# Patient Record
Sex: Female | Born: 1960
Health system: Southern US, Community
[De-identification: ages and names within clinical notes are randomized; demographics above are authoritative.]

## PROBLEM LIST (undated history)

## (undated) DIAGNOSIS — K219 Gastro-esophageal reflux disease without esophagitis: Secondary | ICD-10-CM

## (undated) DIAGNOSIS — S0300XA Dislocation of jaw, unspecified side, initial encounter: Secondary | ICD-10-CM

## (undated) DIAGNOSIS — I10 Essential (primary) hypertension: Secondary | ICD-10-CM

## (undated) DIAGNOSIS — M25569 Pain in unspecified knee: Secondary | ICD-10-CM

## (undated) DIAGNOSIS — M170 Bilateral primary osteoarthritis of knee: Secondary | ICD-10-CM

## (undated) DIAGNOSIS — E785 Hyperlipidemia, unspecified: Secondary | ICD-10-CM

## (undated) DIAGNOSIS — B029 Zoster without complications: Secondary | ICD-10-CM

## (undated) DIAGNOSIS — T753XXA Motion sickness, initial encounter: Secondary | ICD-10-CM

## (undated) HISTORY — PX: LAPAROSCOPIC OVARIAN: SHX5906

## (undated) HISTORY — DX: Hyperlipidemia, unspecified: E78.5

## (undated) HISTORY — PX: COLONOSCOPY: SHX174

## (undated) HISTORY — DX: Zoster without complications: B02.9

## (undated) HISTORY — DX: Gastro-esophageal reflux disease without esophagitis: K21.9

## (undated) HISTORY — DX: Essential (primary) hypertension: I10

---

## 2001-05-23 ENCOUNTER — Other Ambulatory Visit: Admission: RE | Admit: 2001-05-23 | Discharge: 2001-05-23 | Payer: Self-pay | Admitting: Family Medicine

## 2005-10-05 ENCOUNTER — Ambulatory Visit: Payer: Self-pay | Admitting: Family Medicine

## 2006-10-13 ENCOUNTER — Ambulatory Visit: Payer: Self-pay | Admitting: Family Medicine

## 2007-10-17 ENCOUNTER — Ambulatory Visit: Payer: Self-pay | Admitting: Family Medicine

## 2008-10-22 ENCOUNTER — Ambulatory Visit: Payer: Self-pay | Admitting: Family Medicine

## 2009-10-23 ENCOUNTER — Ambulatory Visit: Payer: Self-pay | Admitting: Family Medicine

## 2009-10-27 ENCOUNTER — Ambulatory Visit: Payer: Self-pay | Admitting: Family Medicine

## 2009-11-25 ENCOUNTER — Ambulatory Visit: Payer: Self-pay | Admitting: Family Medicine

## 2010-12-10 ENCOUNTER — Ambulatory Visit: Payer: Self-pay | Admitting: Family Medicine

## 2011-07-26 ENCOUNTER — Ambulatory Visit: Payer: Self-pay | Admitting: Internal Medicine

## 2011-12-13 ENCOUNTER — Ambulatory Visit: Payer: Self-pay | Admitting: Family Medicine

## 2013-01-10 ENCOUNTER — Ambulatory Visit: Payer: Self-pay | Admitting: Family Medicine

## 2013-09-14 LAB — LIPID PANEL
Cholesterol: 219 mg/dL — AB (ref 0–200)
HDL: 59 mg/dL (ref 35–70)
LDL Cholesterol: 131 mg/dL
Triglycerides: 149 mg/dL (ref 40–160)

## 2013-09-14 LAB — BASIC METABOLIC PANEL
BUN: 12 mg/dL (ref 4–21)
CREATININE: 0.6 mg/dL (ref 0.5–1.1)
Glucose: 105 mg/dL
POTASSIUM: 4.7 mmol/L (ref 3.4–5.3)
SODIUM: 143 mmol/L (ref 137–147)

## 2013-09-14 LAB — HEPATIC FUNCTION PANEL
ALT: 18 U/L (ref 7–35)
AST: 18 U/L (ref 13–35)

## 2013-09-14 LAB — CBC AND DIFFERENTIAL
HCT: 44 % (ref 36–46)
HEMOGLOBIN: 15.3 g/dL (ref 12.0–16.0)
PLATELETS: 253 10*3/uL (ref 150–399)
WBC: 6.1 10*3/mL

## 2014-01-18 ENCOUNTER — Ambulatory Visit: Payer: Self-pay | Admitting: Family Medicine

## 2014-01-18 LAB — HM MAMMOGRAPHY

## 2014-01-18 LAB — HM PAP SMEAR: HM PAP: NEGATIVE

## 2014-04-12 ENCOUNTER — Ambulatory Visit: Payer: Self-pay | Admitting: Gastroenterology

## 2014-04-12 LAB — HM COLONOSCOPY

## 2014-12-14 DIAGNOSIS — M25561 Pain in right knee: Secondary | ICD-10-CM

## 2014-12-14 DIAGNOSIS — G43109 Migraine with aura, not intractable, without status migrainosus: Secondary | ICD-10-CM

## 2014-12-14 DIAGNOSIS — B029 Zoster without complications: Secondary | ICD-10-CM

## 2014-12-14 DIAGNOSIS — M1711 Unilateral primary osteoarthritis, right knee: Secondary | ICD-10-CM | POA: Insufficient documentation

## 2014-12-14 DIAGNOSIS — E559 Vitamin D deficiency, unspecified: Secondary | ICD-10-CM

## 2014-12-14 DIAGNOSIS — N926 Irregular menstruation, unspecified: Secondary | ICD-10-CM

## 2014-12-14 HISTORY — DX: Zoster without complications: B02.9

## 2015-02-07 ENCOUNTER — Ambulatory Visit (INDEPENDENT_AMBULATORY_CARE_PROVIDER_SITE_OTHER): Payer: PRIVATE HEALTH INSURANCE | Admitting: Family Medicine

## 2015-02-07 ENCOUNTER — Encounter: Payer: Self-pay | Admitting: Family Medicine

## 2015-02-07 VITALS — BP 116/78 | HR 76 | Temp 98.1°F | Resp 16 | Ht 63.0 in | Wt 290.0 lb

## 2015-02-07 DIAGNOSIS — Z6841 Body Mass Index (BMI) 40.0 and over, adult: Secondary | ICD-10-CM | POA: Insufficient documentation

## 2015-02-07 DIAGNOSIS — G4700 Insomnia, unspecified: Secondary | ICD-10-CM | POA: Diagnosis not present

## 2015-02-07 DIAGNOSIS — R7309 Other abnormal glucose: Secondary | ICD-10-CM

## 2015-02-07 DIAGNOSIS — E559 Vitamin D deficiency, unspecified: Secondary | ICD-10-CM

## 2015-02-07 DIAGNOSIS — Z0000 Encounter for general adult medical examination without abnormal findings: Secondary | ICD-10-CM

## 2015-02-07 DIAGNOSIS — IMO0001 Reserved for inherently not codable concepts without codable children: Secondary | ICD-10-CM | POA: Insufficient documentation

## 2015-02-07 LAB — POCT URINALYSIS DIPSTICK
Bilirubin, UA: NEGATIVE
Blood, UA: NEGATIVE
Glucose, UA: NEGATIVE
Ketones, UA: NEGATIVE
Leukocytes, UA: NEGATIVE
Nitrite, UA: NEGATIVE
PH UA: 6
PROTEIN UA: NEGATIVE
SPEC GRAV UA: 1.01
Urobilinogen, UA: 0.2

## 2015-02-07 MED ORDER — TRAZODONE HCL 50 MG PO TABS: 25.0000 mg | ORAL_TABLET | Freq: Every evening | ORAL | Status: DC | PRN

## 2015-02-07 NOTE — Progress Notes (Signed)
Patient: Mandy Baldwin, Female    DOB: April 26, 1961, 54 y.o.   MRN: [id number] Visit Date: 02/07/2015  Today's Provider: Margarita Rana, MD   Chief Complaint  Patient presents with  . Annual Exam  . Hyperglycemia   Subjective:    Annual physical exam Mandy Baldwin is a 54 y.o. female who presents today for health maintenance and complete physical. She feels well. She reports exercising including walking at work. She reports she is sleeping poorly (Improved with Benadryl or Tylenol PM).  Last CPE-01/18/2014 Last Pap- 01/18/2014- WNL, HPV negative Last Colon- 04/12/2014- Polyp. Repeat 5 years. ----------------------------------------------------------------- Hyperglycemia Pt's last Met C checked 1 year ago showed pt's glucose was 105.  Review of Systems  HENT: Positive for dental problem.   Eyes: Positive for discharge.  Psychiatric/Behavioral: Positive for sleep disturbance.  All other systems reviewed and are negative.   Social History She  reports that she has never smoked. She has never used smokeless tobacco. She reports that she does not drink alcohol or use illicit drugs. Social History   Social History  . Marital Status: Married    Spouse Name: Krithika Tome  . Number of Children: 0  . Years of Education: 13   Occupational History  . Customer Service     [employer]   Social History Main Topics  . Smoking status: Never Smoker   . Smokeless tobacco: Never Used  . Alcohol Use: No  . Drug Use: No  . Sexual Activity: Not Asked   Other Topics Concern  . None   Social History Narrative  . None    Patient Active Problem List   Diagnosis Date Noted  . Avitaminosis D 02/07/2015  . Body mass index of 60 or higher 02/07/2015  . Gravida 0 02/07/2015  . Abnormal glucose 02/07/2015  . Morbid obesity 12/14/2014  . Irregular menses 12/14/2014  . Shingles 12/14/2014  . Vitamin D deficiency 12/14/2014  . Ocular migraine 12/14/2014  . Knee  pain, right 12/14/2014    Past Surgical History  Procedure Laterality Date  . Laparoscopic ovarian      Family History  Family Status  Relation Status Death Age  . Mother Deceased   . Father Deceased   . Sister Deceased    Her family history includes Breast cancer in her sister; COPD in her father; Dementia in her mother.    No Known Allergies  Previous Medications   CHOLECALCIFEROL (VITAMIN D) 1000 UNITS TABLET    Take 2,000 Units by mouth daily.   CINNAMON 500 MG CAPSULE    Take 500 mg by mouth daily.   ETODOLAC (LODINE) 400 MG TABLET    Take 400 mg by mouth 2 (two) times daily.   MULTIPLE VITAMIN (MULTIVITAMINS PO)    Take by mouth.   MULTIPLE VITAMINS-MINERALS (VISION-VITE PRESERVE PO)    Take by mouth.   OMEGA-3 FATTY ACIDS (FISH OIL) 1000 MG CAPS    Take 2 capsules by mouth daily.    Patient Care Team: Margarita Rana, MD as PCP - General (Family Medicine)     Objective:   Vitals: BP 116/78 mmHg  Pulse 76  Temp(Src) 98.1 F (36.7 C) (Oral)  Resp 16  Ht _0  (1.6 m)  Wt 290 lb (131.543 kg)  BMI 51.38 kg/m2   Physical Exam  Constitutional: She is oriented to person, place, and time. She appears well-developed and well-nourished.  HENT:  Head: Normocephalic and atraumatic.  Right Ear: Tympanic membrane,  external ear and ear canal normal.  Left Ear: Tympanic membrane, external ear and ear canal normal.  Nose: Nose normal.  Mouth/Throat: Uvula is midline, oropharynx is clear and moist and mucous membranes are normal.  Eyes: Conjunctivae, EOM and lids are normal. Pupils are equal, round, and reactive to light.  Neck: Trachea normal and normal range of motion. Neck supple. Carotid bruit is not present. No thyroid mass and no thyromegaly present.  Cardiovascular: Normal rate, regular rhythm and normal heart sounds.   Pulmonary/Chest: Effort normal and breath sounds normal.  Abdominal: Soft. Normal appearance and bowel sounds are normal. There is no  hepatosplenomegaly. There is no tenderness.  Genitourinary: No breast swelling, tenderness or discharge.  Musculoskeletal: Normal range of motion.  Lymphadenopathy:    She has no cervical adenopathy.    She has no axillary adenopathy.  Neurological: She is alert and oriented to person, place, and time. She has normal strength. No cranial nerve deficit.  Skin: Skin is warm, dry and intact.  Psychiatric: She has a normal mood and affect. Her speech is normal and behavior is normal. Judgment and thought content normal. Cognition and memory are normal.     Depression Screen PHQ 2/9 Scores 02/07/2015  PHQ - 2 Score 0      Assessment & Plan:     Routine Health Maintenance and Physical Exam  Exercise Activities and Dietary recommendations Goals    . Reduce portion size       Immunization History  Administered Date(s) Administered  . MMR 12/24/1998  . Td 03/04/2005    Health Maintenance  Topic Date Due  . Hepatitis C Screening  Oct 19, 1960  . HIV Screening  11/26/1977  . INFLUENZA VACCINE  01/06/2015  . TETANUS/TDAP  03/05/2015  . MAMMOGRAM  01/19/2016  . PAP SMEAR  01/18/2017  . COLONOSCOPY  04/12/2024      Discussed health benefits of physical activity, and encouraged her to engage in regular exercise appropriate for her age and condition. - POCT urinalysis dipstick - TSH - Comprehensive metabolic panel - CBC with Differential/Platelet   Results for orders placed or performed in visit on 02/07/15  POCT urinalysis dipstick  Result Value Ref Range   Color, UA yellow    Clarity, UA clear    Glucose, UA N    Bilirubin, UA N    Ketones, UA N    Spec Grav, UA 1.010    Blood, UA N    pH, UA 6.0    Protein, UA N    Urobilinogen, UA 0.2    Nitrite, UA N    Leukocytes, UA Negative Negative    2. Abnormal glucose Had one high fasting. Will check labs.  - Hemoglobin A1c  3. Morbid obesity Will check labs.  - Comprehensive metabolic panel - CBC with  Differential/Platelet - Lipid panel  4. Insomnia- Will try Trazodone 50 mg every evening. Recheck as needed. Patient to call with response.   Patient was seen and examined by Jerrell Belfast, MD, and note scribed by Renaldo Fiddler, CMA. I have reviewed the document for accuracy and completeness and I agree with above. Jerrell Belfast, MD   Margarita Rana, MD     --------------------------------------------------------------------

## 2015-02-11 ENCOUNTER — Other Ambulatory Visit: Payer: Self-pay | Admitting: Family Medicine

## 2015-02-11 DIAGNOSIS — Z1231 Encounter for screening mammogram for malignant neoplasm of breast: Secondary | ICD-10-CM

## 2015-02-12 ENCOUNTER — Telehealth: Payer: Self-pay | Admitting: Family Medicine

## 2015-02-12 ENCOUNTER — Ambulatory Visit: Payer: Self-pay

## 2015-02-12 NOTE — Telephone Encounter (Signed)
Pt needs to have her lab req reprinted.  She misplaced the one she recd. When she was in.  Call back is [phone]  Thanks Barth Kirks

## 2015-02-12 NOTE — Telephone Encounter (Signed)
Pt advised.   Thanks,   -Sharlyn Odonnel  

## 2015-02-12 NOTE — Telephone Encounter (Signed)
Pt called to see if her lab slip was ready. Pt request to be called when it is ready. Thanks TNP

## 2015-02-13 ENCOUNTER — Other Ambulatory Visit: Payer: Self-pay | Admitting: Family Medicine

## 2015-02-13 ENCOUNTER — Ambulatory Visit
Admission: RE | Admit: 2015-02-13 | Discharge: 2015-02-13 | Disposition: A | Discharge status: Home / Self Care | Payer: No Typology Code available for payment source | Source: Ambulatory Visit | Attending: Family Medicine | Admitting: Family Medicine

## 2015-02-13 DIAGNOSIS — Z1231 Encounter for screening mammogram for malignant neoplasm of breast: Secondary | ICD-10-CM | POA: Insufficient documentation

## 2015-02-13 DIAGNOSIS — N6489 Other specified disorders of breast: Secondary | ICD-10-CM

## 2015-02-14 ENCOUNTER — Telehealth: Payer: Self-pay

## 2015-02-14 ENCOUNTER — Telehealth: Payer: Self-pay | Admitting: Family Medicine

## 2015-02-14 LAB — CBC WITH DIFFERENTIAL/PLATELET
Basophils Absolute: 0 10*3/uL (ref 0.0–0.2)
Basos: 0 %
EOS (ABSOLUTE): 0.1 10*3/uL (ref 0.0–0.4)
Eos: 1 %
Hematocrit: 46 % (ref 34.0–46.6)
Hemoglobin: 15.7 g/dL (ref 11.1–15.9)
Immature Grans (Abs): 0 10*3/uL (ref 0.0–0.1)
Immature Granulocytes: 0 %
LYMPHS ABS: 2.4 10*3/uL (ref 0.7–3.1)
Lymphs: 35 %
MCH: 31.2 pg (ref 26.6–33.0)
MCHC: 34.1 g/dL (ref 31.5–35.7)
MCV: 92 fL (ref 79–97)
MONOS ABS: 0.6 10*3/uL (ref 0.1–0.9)
Monocytes: 9 %
Neutrophils Absolute: 3.7 10*3/uL (ref 1.4–7.0)
Neutrophils: 55 %
Platelets: 240 10*3/uL (ref 150–379)
RBC: 5.03 x10E6/uL (ref 3.77–5.28)
RDW: 13.4 % (ref 12.3–15.4)
WBC: 6.8 10*3/uL (ref 3.4–10.8)

## 2015-02-14 LAB — TSH: TSH: 0.781 u[IU]/mL (ref 0.450–4.500)

## 2015-02-14 LAB — COMPREHENSIVE METABOLIC PANEL
A/G RATIO: 1.3 (ref 1.1–2.5)
ALBUMIN: 4.3 g/dL (ref 3.5–5.5)
ALK PHOS: 85 IU/L (ref 39–117)
ALT: 15 IU/L (ref 0–32)
AST: 14 IU/L (ref 0–40)
BILIRUBIN TOTAL: 0.3 mg/dL (ref 0.0–1.2)
BUN/Creatinine Ratio: 32 — ABNORMAL HIGH (ref 9–23)
BUN: 18 mg/dL (ref 6–24)
CHLORIDE: 102 mmol/L (ref 97–108)
CO2: 25 mmol/L (ref 18–29)
Calcium: 10 mg/dL (ref 8.7–10.2)
Creatinine, Ser: 0.56 mg/dL — ABNORMAL LOW (ref 0.57–1.00)
GFR calc Af Amer: 124 mL/min/{1.73_m2} (ref >59)
GFR calc non Af Amer: 108 mL/min/{1.73_m2} (ref >59)
GLOBULIN, TOTAL: 3.2 g/dL (ref 1.5–4.5)
Glucose: 110 mg/dL — ABNORMAL HIGH (ref 65–99)
Potassium: 4.9 mmol/L (ref 3.5–5.2)
SODIUM: 144 mmol/L (ref 134–144)
Total Protein: 7.5 g/dL (ref 6.0–8.5)

## 2015-02-14 LAB — LIPID PANEL
Chol/HDL Ratio: 3.6 ratio units (ref 0.0–4.4)
Cholesterol, Total: 214 mg/dL — ABNORMAL HIGH (ref 100–199)
HDL: 60 mg/dL (ref >39)
LDL Calculated: 124 mg/dL — ABNORMAL HIGH (ref 0–99)
Triglycerides: 149 mg/dL (ref 0–149)
VLDL CHOLESTEROL CAL: 30 mg/dL (ref 5–40)

## 2015-02-14 LAB — HEMOGLOBIN A1C
ESTIMATED AVERAGE GLUCOSE: 108 mg/dL
Hgb A1c MFr Bld: 5.4 % (ref 4.8–5.6)

## 2015-02-14 NOTE — Telephone Encounter (Signed)
Pt stated that at her visit last Friday she had to urinate in a cup and she thought maybe a urinalysis was done but she didn't get the results. Pt would like a call back for the results. Thanks TNP

## 2015-02-14 NOTE — Telephone Encounter (Signed)
Advised pt of lab results. Pt verbally acknowledges understanding. Emily Drozdowski, CMA   

## 2015-02-14 NOTE — Telephone Encounter (Signed)
Pt advised of ua result. sd

## 2015-02-14 NOTE — Telephone Encounter (Signed)
-----   Message from Nancy Maloney, MD sent at 02/14/2015  3:43 PM EDT ----- Labs stable.  Please notify patient. Thanks. 

## 2015-02-14 NOTE — Telephone Encounter (Signed)
-----   Message from Lorie Phenix, MD sent at 02/14/2015  3:43 PM EDT ----- Labs stable.  Please notify patient. Thanks.

## 2015-02-18 ENCOUNTER — Ambulatory Visit
Admission: RE | Admit: 2015-02-18 | Discharge: 2015-02-18 | Disposition: A | Discharge status: Home / Self Care | Payer: PRIVATE HEALTH INSURANCE | Source: Ambulatory Visit | Attending: Family Medicine | Admitting: Family Medicine

## 2015-02-18 DIAGNOSIS — N6489 Other specified disorders of breast: Secondary | ICD-10-CM

## 2015-02-18 DIAGNOSIS — N63 Unspecified lump in breast: Secondary | ICD-10-CM | POA: Diagnosis not present

## 2015-03-13 ENCOUNTER — Encounter: Payer: Self-pay | Admitting: Family Medicine

## 2015-03-13 ENCOUNTER — Ambulatory Visit (INDEPENDENT_AMBULATORY_CARE_PROVIDER_SITE_OTHER): Payer: PRIVATE HEALTH INSURANCE | Admitting: Family Medicine

## 2015-03-13 VITALS — BP 162/98 | HR 100 | Temp 98.2°F | Resp 18 | Wt 293.4 lb

## 2015-03-13 DIAGNOSIS — R030 Elevated blood-pressure reading, without diagnosis of hypertension: Secondary | ICD-10-CM | POA: Diagnosis not present

## 2015-03-13 DIAGNOSIS — H6983 Other specified disorders of Eustachian tube, bilateral: Secondary | ICD-10-CM | POA: Diagnosis not present

## 2015-03-13 DIAGNOSIS — IMO0001 Reserved for inherently not codable concepts without codable children: Secondary | ICD-10-CM

## 2015-03-13 MED ORDER — FLUTICASONE PROPIONATE 50 MCG/ACT NA SUSP: 2.0000 | Freq: Every day | NASAL | Status: DC

## 2015-03-13 NOTE — Progress Notes (Signed)
Patient ID: Mandy Baldwin, female   DOB: 1960-08-19, 54 y.o.   MRN: [id number] Name: Mandy Baldwin   MRN: [id number]    DOB: 06-07-1961   Date:03/13/2015       Progress Note  Subjective  Chief Complaint  Chief Complaint  Patient presents with  . Ear Pain    bilateral X 1 week    Otalgia  There is pain in both ears. This is a new problem. The current episode started 1 to 4 weeks ago. The problem occurs constantly. The problem has been gradually worsening.   History reviewed. No pertinent past medical history. Patient Active Problem List   Diagnosis Date Noted  . Avitaminosis D 02/07/2015  . Body mass index of 60 or higher (HCC) 02/07/2015  . Gravida 0 02/07/2015  . Abnormal glucose 02/07/2015  . Morbid obesity (HCC) 12/14/2014  . Irregular menses 12/14/2014  . Shingles 12/14/2014  . Vitamin D deficiency 12/14/2014  . Ocular migraine 12/14/2014  . Knee pain, right 12/14/2014   Past Surgical History  Procedure Laterality Date  . Laparoscopic ovarian     Family History  Problem Relation Age of Onset  . Dementia Mother   . COPD Father   . Breast cancer Sister 34    Social History  Substance Use Topics  . Smoking status: Never Smoker   . Smokeless tobacco: Never Used  . Alcohol Use: No    Current outpatient prescriptions:  .  cholecalciferol (VITAMIN D) 1000 UNITS tablet, Take 2,000 Units by mouth daily., Disp: , Rfl:  .  Cinnamon 500 MG capsule, Take 500 mg by mouth daily., Disp: , Rfl:  .  etodolac (LODINE) 400 MG tablet, Take 400 mg by mouth 2 (two) times daily., Disp: , Rfl:  .  Multiple Vitamin (MULTIVITAMINS PO), Take by mouth., Disp: , Rfl:  .  Multiple Vitamins-Minerals (VISION-VITE PRESERVE PO), Take by mouth., Disp: , Rfl:  .  Omega-3 Fatty Acids (FISH OIL) 1000 MG CAPS, Take 2 capsules by mouth daily., Disp: , Rfl:  .  traZODone (DESYREL) 50 MG tablet, Take 0.5-1 tablets (25-50 mg total) by mouth at bedtime as needed for sleep. (Patient not  taking: Reported on 03/13/2015), Disp: 30 tablet, Rfl: 3  No Known Allergies  Review of Systems  Constitutional: Negative.   HENT: Positive for ear pain and tinnitus.   Eyes: Negative.   Respiratory: Negative.   Cardiovascular: Negative.   Gastrointestinal: Negative.   Genitourinary: Negative.   Musculoskeletal: Negative.   Skin: Negative.   Neurological: Negative.   Endo/Heme/Allergies: Negative.   Psychiatric/Behavioral: Negative.    Objective  Filed Vitals:   03/13/15 0814  BP: 162/98  Pulse: 100  Temp: 98.2 F (36.8 C)  TempSrc: Oral  Resp: 18  Weight: 293 lb 6.4 oz (133.085 kg)  SpO2: 96%   Physical Exam  Constitutional: She is oriented to person, place, and time and well-developed, well-nourished, and in no distress.  HENT:  Head: Normocephalic.  Mouth/Throat: Oropharynx is clear and moist.  Dull reflex of TM's with some retraction.  Eyes: Conjunctivae and EOM are normal.  Neck: Neck supple.  Cardiovascular: Normal rate and regular rhythm.   Pulmonary/Chest: Effort normal and breath sounds normal.  Neurological: She is alert and oriented to person, place, and time.  Skin: No rash noted.  Psychiatric: Affect and judgment normal.    Recent Results (from the past 2160 hour(s))  POCT urinalysis dipstick     Status: Normal   Collection Time: 02/07/15  3:33 PM  Result Value Ref Range   Color, UA yellow    Clarity, UA clear    Glucose, UA N    Bilirubin, UA N    Ketones, UA N    Spec Grav, UA 1.010    Blood, UA N    pH, UA 6.0    Protein, UA N    Urobilinogen, UA 0.2    Nitrite, UA N    Leukocytes, UA Negative Negative  TSH     Status: None   Collection Time: 02/13/15  8:41 AM  Result Value Ref Range   TSH 0.781 0.450 - 4.500 uIU/mL  Comprehensive metabolic panel     Status: Abnormal   Collection Time: 02/13/15  8:41 AM  Result Value Ref Range   Glucose 110 (H) 65 - 99 mg/dL   BUN 18 6 - 24 mg/dL   Creatinine, Ser 1.61 (L) 0.57 - 1.00 mg/dL   GFR  calc non Af Amer 108 >59 mL/min/1.73   GFR calc Af Amer 124 >59 mL/min/1.73   BUN/Creatinine Ratio 32 (H) 9 - 23   Sodium 144 134 - 144 mmol/L   Potassium 4.9 3.5 - 5.2 mmol/L   Chloride 102 97 - 108 mmol/L   CO2 25 18 - 29 mmol/L   Calcium 10.0 8.7 - 10.2 mg/dL   Total Protein 7.5 6.0 - 8.5 g/dL   Albumin 4.3 3.5 - 5.5 g/dL   Globulin, Total 3.2 1.5 - 4.5 g/dL   Albumin/Globulin Ratio 1.3 1.1 - 2.5   Bilirubin Total 0.3 0.0 - 1.2 mg/dL   Alkaline Phosphatase 85 39 - 117 IU/L   AST 14 0 - 40 IU/L   ALT 15 0 - 32 IU/L  CBC with Differential/Platelet     Status: None   Collection Time: 02/13/15  8:41 AM  Result Value Ref Range   WBC 6.8 3.4 - 10.8 x10E3/uL   RBC 5.03 3.77 - 5.28 x10E6/uL   Hemoglobin 15.7 11.1 - 15.9 g/dL   Hematocrit 09.6 04.5 - 46.6 %   MCV 92 79 - 97 fL   MCH 31.2 26.6 - 33.0 pg   MCHC 34.1 31.5 - 35.7 g/dL   RDW 40.9 81.1 - 91.4 %   Platelets 240 150 - 379 x10E3/uL   Neutrophils 55 %   Lymphs 35 %   Monocytes 9 %   Eos 1 %   Basos 0 %   Neutrophils Absolute 3.7 1.4 - 7.0 x10E3/uL   Lymphocytes Absolute 2.4 0.7 - 3.1 x10E3/uL   Monocytes Absolute 0.6 0.1 - 0.9 x10E3/uL   EOS (ABSOLUTE) 0.1 0.0 - 0.4 x10E3/uL   Basophils Absolute 0.0 0.0 - 0.2 x10E3/uL   Immature Granulocytes 0 %   Immature Grans (Abs) 0.0 0.0 - 0.1 x10E3/uL  Hemoglobin A1c     Status: None   Collection Time: 02/13/15  8:41 AM  Result Value Ref Range   Hgb A1c MFr Bld 5.4 4.8 - 5.6 %    Comment:          Pre-diabetes: 5.7 - 6.4          Diabetes: >6.4          Glycemic control for adults with diabetes: <7.0    Est. average glucose Bld gHb Est-mCnc 108 mg/dL  Lipid panel     Status: Abnormal   Collection Time: 02/13/15  8:41 AM  Result Value Ref Range   Cholesterol, Total 214 (H) 100 - 199 mg/dL   Triglycerides  149 0 - 149 mg/dL   HDL 60 >66 mg/dL    Comment: According to ATP-III Guidelines, HDL-C >59 mg/dL is considered a negative risk factor for CHD.    VLDL Cholesterol Cal  30 5 - 40 mg/dL   LDL Calculated 440 (H) 0 - 99 mg/dL   Chol/HDL Ratio 3.6 0.0 - 4.4 ratio units    Comment:                                   T. Chol/HDL Ratio                                             Men  Women                               1/2 Avg.Risk  3.4    3.3                                   Avg.Risk  5.0    4.4                                2X Avg.Risk  9.6    7.1                                3X Avg.Risk 23.4   11.0     Assessment & Plan  1. Eustachian tube dysfunction, bilateral Onset over the past week. Some slight cough but no fever, sore throat or rhinorrhea. No recent flights or travel to higher altitudes. Will treat with Fluticasone nasal spray and may add Zyrtec or Claritin prn. Recheck in 10-14 days if no better. - fluticasone (FLONASE) 50 MCG/ACT nasal spray; Place 2 sprays into both nostrils daily.  Dispense: 16 g; Refill: 6  2. Elevated blood pressure No past history of BP issues. Denies dyspnea, chest pains or palpitations. Should limit caffeine and sodium in diet. Don't use decongestants now. Recheck BP at home and follow up her in 2 weeks with Dr. Elease Hashimoto if still elevated.

## 2015-03-14 ENCOUNTER — Telehealth: Payer: Self-pay | Admitting: Family Medicine

## 2015-03-14 NOTE — Telephone Encounter (Signed)
Advised pt as MD's notes for medication prescription sent to pharmacy by MD yesterday and what medications she can take OTC.  Pt verbalized fully understanding.  Thanks,

## 2015-03-14 NOTE — Telephone Encounter (Signed)
Pt would like to speak with a nurse to verify what Maurine Minister called in for her yesterday and what he told her she could also try over the counter. Thanks TNP

## 2015-04-30 ENCOUNTER — Other Ambulatory Visit: Payer: Self-pay | Admitting: Family Medicine

## 2015-06-03 ENCOUNTER — Encounter: Payer: Self-pay | Admitting: Family Medicine

## 2015-06-03 ENCOUNTER — Ambulatory Visit (INDEPENDENT_AMBULATORY_CARE_PROVIDER_SITE_OTHER): Payer: PRIVATE HEALTH INSURANCE | Admitting: Family Medicine

## 2015-06-03 VITALS — BP 140/88 | HR 73 | Temp 98.6°F | Resp 16 | Wt 295.4 lb

## 2015-06-03 DIAGNOSIS — M26609 Unspecified temporomandibular joint disorder, unspecified side: Secondary | ICD-10-CM | POA: Diagnosis not present

## 2015-06-03 DIAGNOSIS — H6981 Other specified disorders of Eustachian tube, right ear: Secondary | ICD-10-CM | POA: Diagnosis not present

## 2015-06-03 MED ORDER — PREDNISONE 10 MG PO TABS: ORAL_TABLET | ORAL | Status: DC

## 2015-06-03 MED ORDER — CYCLOBENZAPRINE HCL 5 MG PO TABS: 5.0000 mg | ORAL_TABLET | Freq: Every day | ORAL | Status: DC

## 2015-06-03 NOTE — Progress Notes (Signed)
Patient: Mandy Baldwin Female    DOB: 1960-11-28   54 y.o.   MRN: [id number] Visit Date: 06/03/2015  Today's Provider: Lorie Phenix, MD   Chief Complaint  Patient presents with  . Otalgia   Subjective:    Otalgia  There is pain in the right ear. This is a recurrent problem. The current episode started in the past 7 days. The problem occurs constantly. The problem has been gradually worsening. There has been no fever. The pain is at a severity of 6/10 (yesterday patient states it was a 12.). The pain is moderate. Associated symptoms include coughing. Pertinent negatives include no ear discharge, headaches, hearing loss, neck pain or sore throat. She has tried ear drops and NSAIDs (and nasal steroiids.  Also, been using a mouth guard at night.   Musclle relaxers did help . ) for the symptoms. The treatment provided mild relief. There is no history of a chronic ear infection or hearing loss.   Was treated for UTD.  Did not really help much. Also saw her dentist.   No dental issues. Is wearing an OTC mouth guard.   Here with her husband today.       No Known Allergies Previous Medications   CHOLECALCIFEROL (VITAMIN D) 1000 UNITS TABLET    Take 2,000 Units by mouth daily.   CINNAMON 500 MG CAPSULE    Take 500 mg by mouth daily.   ETODOLAC (LODINE) 400 MG TABLET    TAKE ONE TABLET BY MOUTH TWICE DAILY AS NEEDED   MULTIPLE VITAMINS-MINERALS (VISION-VITE PRESERVE PO)    Take by mouth.   OMEGA-3 FATTY ACIDS (FISH OIL) 1000 MG CAPS    Take 2 capsules by mouth daily.   TRAZODONE (DESYREL) 50 MG TABLET    Take 0.5-1 tablets (25-50 mg total) by mouth at bedtime as needed for sleep.    Review of Systems  Constitutional: Negative for fever.  HENT: Positive for congestion, ear pain and sinus pressure (just on the right side). Negative for ear discharge, hearing loss, sneezing, sore throat and trouble swallowing.   Respiratory: Positive for cough. Negative for shortness of breath.     Cardiovascular: Negative for chest pain.  Musculoskeletal: Negative for neck pain.  Neurological: Negative for facial asymmetry and headaches.    Social History  Substance Use Topics  . Smoking status: Never Smoker   . Smokeless tobacco: Never Used  . Alcohol Use: No   Objective:   BP 140/88 mmHg  Pulse 73  Temp(Src) 98.6 F (37 C) (Oral)  Resp 16  Wt 295 lb 6.4 oz (133.993 kg)  Physical Exam  Constitutional: She is oriented to person, place, and time. She appears well-developed and well-nourished.  HENT:  Head: Normocephalic and atraumatic.  Right Ear: External ear normal.  Left Ear: External ear normal.  Mouth/Throat: Oropharynx is clear and moist.  Eyes: Conjunctivae and EOM are normal. Pupils are equal, round, and reactive to light.  Neck: Normal range of motion. Neck supple.  Cardiovascular: Normal rate and regular rhythm.   Pulmonary/Chest: Effort normal and breath sounds normal.  Musculoskeletal: She exhibits tenderness.  Right  TMJ to palpation.    Neurological: She is alert and oriented to person, place, and time.  Psychiatric: She has a normal mood and affect. Her behavior is normal. Judgment and thought content normal.      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 Continue  Mouth guard.  Will treat as below.   Patient instructed to call back if condition worsens or does not improve.   No Etodolac while on Prednisone.   - cyclobenzaprine (FLEXERIL) 5 MG tablet; Take 1 tablet (5 mg total) by mouth at bedtime.  Dispense: 30 tablet; Refill: 1 - predniSONE (DELTASONE) 10 MG tablet; 6 po for 2 days and then 5 po for 2 days and then 4 po for 2 days and 3 po for 2 days and then 2 po for 2 days and then 1 po for 2 days.  Dispense: 42 tablet; Refill: 0  2. Eustachian tube dysfunction, right Will treat with Prednisone. Follow up if does not resolve.    Lorie PhenixNancy Atticus Wedin, MD       Lorie PhenixNancy Raeanna Soberanes, MD  [facility]

## 2015-06-03 NOTE — Patient Instructions (Signed)

## 2015-06-18 ENCOUNTER — Telehealth: Payer: Self-pay | Admitting: Family Medicine

## 2015-06-18 MED ORDER — AMOXICILLIN-POT CLAVULANATE 875-125 MG PO TABS: 1.0000 | ORAL_TABLET | Freq: Two times a day (BID) | ORAL | Status: DC

## 2015-06-18 MED ORDER — ONDANSETRON 4 MG PO TBDP: 4.0000 mg | ORAL_TABLET | Freq: Three times a day (TID) | ORAL | Status: DC | PRN

## 2015-06-18 NOTE — Telephone Encounter (Signed)
Pt would also like something sent in for nausea.  [facility] [address]   Thanks,   -Vernona RiegerLaura

## 2015-06-18 NOTE — Telephone Encounter (Signed)
Sent in antibiotic. Thanks.  

## 2015-06-18 NOTE — Telephone Encounter (Signed)
Pt states she was in last week for TMJ and that is better.  Pt states she still has the cough and congestion. Pt is coughing up green phlegm.   Pt is requesting a Rx to help with this if possible. Pt states an antibiotic will make her have nausea so she will need a RX to help with that also.   [facility] [address]  YQ#[phone]/BMCB#[phone]/MW

## 2015-06-26 ENCOUNTER — Other Ambulatory Visit: Payer: Self-pay | Admitting: Family Medicine

## 2015-06-26 DIAGNOSIS — M26609 Unspecified temporomandibular joint disorder, unspecified side: Secondary | ICD-10-CM

## 2015-06-27 ENCOUNTER — Encounter: Payer: Self-pay | Admitting: Family Medicine

## 2015-06-27 ENCOUNTER — Ambulatory Visit (INDEPENDENT_AMBULATORY_CARE_PROVIDER_SITE_OTHER): Payer: PRIVATE HEALTH INSURANCE | Admitting: Family Medicine

## 2015-06-27 VITALS — BP 124/92 | HR 90 | Temp 99.1°F | Resp 16 | Wt 292.0 lb

## 2015-06-27 DIAGNOSIS — J209 Acute bronchitis, unspecified: Secondary | ICD-10-CM

## 2015-06-27 DIAGNOSIS — K648 Other hemorrhoids: Secondary | ICD-10-CM | POA: Insufficient documentation

## 2015-06-27 DIAGNOSIS — B373 Candidiasis of vulva and vagina: Secondary | ICD-10-CM

## 2015-06-27 DIAGNOSIS — B3731 Acute candidiasis of vulva and vagina: Secondary | ICD-10-CM | POA: Insufficient documentation

## 2015-06-27 MED ORDER — FLUCONAZOLE 150 MG PO TABS: 150.0000 mg | ORAL_TABLET | Freq: Once | ORAL | Status: DC

## 2015-06-27 MED ORDER — AZITHROMYCIN 250 MG PO TABS: ORAL_TABLET | ORAL | Status: DC

## 2015-06-27 NOTE — Progress Notes (Signed)
Subjective:     Patient ID: Mandy Baldwin, female   DOB: 11-29-1960, 55 y.o.   MRN: [id number]  Chief Complaint  Patient presents with  . Rectal Bleeding    Pt noticed blood in toilet bowl with red water and bloody toilet tissue this morning. Pt had a similar BM about 20 minutes later.     Rectal Bleeding  The current episode started today. Episode frequency: twice, 20 minutes apart this morning. None since.  Progression since onset: Two BMs that stained toilet bowel red and stained the entire tissue paper bright red. Had third, small BM later at work 3 hrs later with some red streaking on the toilet paper but not the amount she had in am. No pain. No other symptoms.   The patient is experiencing no pain. Stool description: 1st BM normal consistency, 2nd BM large volume softer but not diarrhea.  Treatments tried: Not had these symptoms previously.  Associated symptoms include hemorrhoids (internal, noted on colonoscopy. ), nausea (after getting out of the shower after BM, none since. Did take a hot shower.  ) and coughing. Pertinent negatives include no abdominal pain, no diarrhea, no hematemesis, no rectal pain, no hematuria, no vaginal bleeding and no vaginal discharge. She has been behaving normally. She has been eating and drinking normally. Her past medical history is significant for recent antibiotic use and a recent illness. Her past medical history does not include recent change in diet.   Last colonoscopy 2015 found a polyp and  internal hemorrhoids. No inflammation or colitis.   Cough Has been coughing for a few weeks. Producing green mucous 3-4x/day, unchanged. Took Augmentin 7 days (called in 06/18/15), did not complete the course secondary to vaginal irritation.. No fevers. No rhinorrhea, sore throat, congestion, sinus pain.  Pt has had pneumonia in the past during which she had fever to 102 and  productive sputum.  This does not feel like pneumonia to her.   Vaginal burning For  3-4 days with every urinary void burning in vaginal and rectal area. No urgency, frequency. No hematuria or changes in appearance of urine. Took Augmentin for 7 days, stopped 2 days ago.    Has not had treatment for a yeast infection.    Patient Active Problem List   Diagnosis Date Noted  . Bronchitis, acute 06/27/2015  . Internal hemorrhoid, bleeding 06/27/2015  . Vaginal candidiasis 06/27/2015  . 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 Avitaminosis D 02/07/2015  . Body mass index of 60 or higher (HCC) 02/07/2015  . Gravida 0 02/07/2015  . Abnormal glucose 02/07/2015  . Morbid obesity (HCC) 12/14/2014  . Irregular menses 12/14/2014  . Shingles 12/14/2014  . Vitamin D deficiency 12/14/2014  . Ocular migraine 12/14/2014  . Knee pain, right 12/14/2014   Previous Medications   CHOLECALCIFEROL (VITAMIN D) 1000 UNITS TABLET    Take 2,000 Units by mouth daily.   CINNAMON 500 MG CAPSULE    Take 500 mg by mouth daily.   CYCLOBENZAPRINE (FLEXERIL) 5 MG TABLET    Take 1 tablet (5 mg total) by mouth at bedtime.   ETODOLAC (LODINE) 400 MG TABLET    TAKE ONE TABLET BY MOUTH TWICE DAILY AS NEEDED   MULTIPLE VITAMINS-MINERALS (VISION-VITE PRESERVE PO)    Take by mouth.   OMEGA-3 FATTY ACIDS (FISH OIL) 1000 MG CAPS    Take 2 capsules by mouth daily.   No Known Allergies Past Surgical History  Procedure Laterality Date  . Laparoscopic ovarian  Family History  Problem Relation Age of Onset  . Dementia Mother   . COPD Father   . Breast cancer Sister 59  . Aortic aneurysm Mother    Social History   Social History  . Marital Status: Married    Spouse Name: Yukiko Minnich  . Number of Children: 0  . Years of Education: 13   Occupational History  . Customer Service     [employer]   Social History Main Topics  . Smoking status: Never Smoker   . Smokeless tobacco: Never Used  . Alcohol Use: No  . Drug Use: No  . Sexual Activity: Not on file   Other Topics Concern  . Not on  file   Social History Narrative     Review of Systems  HENT: Negative for congestion, rhinorrhea, sinus pressure and sore throat.   Eyes: Negative.   Respiratory: Positive for cough. Negative for shortness of breath.   Gastrointestinal: Positive for nausea (after getting out of the shower after BM, none since. Did take a hot shower.  ), blood in stool, hematochezia and hemorrhoids (internal, noted on colonoscopy. ). Negative for abdominal pain, diarrhea, rectal pain and hematemesis.  Genitourinary: Positive for dysuria. Negative for urgency, frequency, hematuria, vaginal bleeding and vaginal discharge.       Objective:   Physical Exam  Constitutional: She is oriented to person, place, and time. She appears well-developed and well-nourished. No distress.  HENT:  Head: Normocephalic and atraumatic.  Nose: Nose normal.  Mouth/Throat: Oropharynx is clear and moist. No oropharyngeal exudate.  Bilateral TM clear, no frontal or maxillary sinus tenderness  Eyes: EOM are normal. Pupils are equal, round, and reactive to light. Right eye exhibits no discharge. Left eye exhibits no discharge.  Neck: Normal range of motion. Neck supple.  Cardiovascular: Normal rate, regular rhythm and normal heart sounds.   Pulmonary/Chest: Effort normal and breath sounds normal. No respiratory distress.  Abdominal: She exhibits no distension.  Genitourinary:  Significant erythema and irritation of skin around the anorectal area, small nonbleeding external hemorrhoid, slight bleeding on internal hemorrhoid visualized on anoscopy today.  Slight blood tinge noted on anoscope, with light brown stool noted.    Neurological: She is alert and oriented to person, place, and time. No cranial nerve deficit.  Skin: Rash (around rectum.  ) noted. She is not diaphoretic. There is erythema.  Psychiatric: She has a normal mood and affect.    BP 124/92 mmHg  Pulse 90  Temp(Src) 99.1 F (37.3 C) (Oral)  Resp 16  Wt 292 lb  (132.45 kg)  SpO2 97%     Assessment:     Suspect bronchitis, internal hemorrhoid and candidiasis.   Plan:     1. Acute bronchitis, unspecified organism Not resolved with Augmentin. Will treat with Zpak.  Patient instructed to call back if condition worsens or does not improve.    - azithromycin (ZITHROMAX) 250 MG tablet; 2 today and then one a day for 4 more days.  Dispense: 6 tablet; Refill: 0  2. Internal hemorrhoid, bleeding Reassurance.  Patient instructed to call back if condition worsens or does not improve.  If any blood mixed in stools, melana, or dark tarry stools, call office or answering service as could be sign or worsening, different problem.    3. Vaginal candidiasis New problem. Will treat. Patient instructed to call back if condition worsens or does not improve.    - fluconazole (DIFLUCAN) 150 MG tablet; Take 1 tablet (150 mg total)  by mouth once.  Dispense: 1 tablet; Refill: 0   Lorie Phenix, MD

## 2015-07-02 ENCOUNTER — Telehealth: Payer: Self-pay | Admitting: Family Medicine

## 2015-07-13 ENCOUNTER — Emergency Department
Admission: EM | Admit: 2015-07-13 | Discharge: 2015-07-13 | Disposition: A | Discharge status: Home / Self Care | Payer: No Typology Code available for payment source | Attending: Emergency Medicine | Admitting: Emergency Medicine

## 2015-07-13 ENCOUNTER — Encounter: Payer: Self-pay | Admitting: Emergency Medicine

## 2015-07-13 ENCOUNTER — Emergency Department: Payer: No Typology Code available for payment source

## 2015-07-13 DIAGNOSIS — Z792 Long term (current) use of antibiotics: Secondary | ICD-10-CM | POA: Diagnosis not present

## 2015-07-13 DIAGNOSIS — Z79899 Other long term (current) drug therapy: Secondary | ICD-10-CM | POA: Diagnosis not present

## 2015-07-13 DIAGNOSIS — R079 Chest pain, unspecified: Secondary | ICD-10-CM | POA: Insufficient documentation

## 2015-07-13 HISTORY — DX: Dislocation of jaw, unspecified side, initial encounter: S03.00XA

## 2015-07-13 LAB — CBC
HEMATOCRIT: 46.4 % (ref 35.0–47.0)
HEMOGLOBIN: 15.6 g/dL (ref 12.0–16.0)
MCH: 30.6 pg (ref 26.0–34.0)
MCHC: 33.7 g/dL (ref 32.0–36.0)
MCV: 90.9 fL (ref 80.0–100.0)
Platelets: 247 10*3/uL (ref 150–440)
RBC: 5.11 MIL/uL (ref 3.80–5.20)
RDW: 13.2 % (ref 11.5–14.5)
WBC: 8 10*3/uL (ref 3.6–11.0)

## 2015-07-13 LAB — BASIC METABOLIC PANEL
ANION GAP: 9 (ref 5–15)
BUN: 13 mg/dL (ref 6–20)
CHLORIDE: 107 mmol/L (ref 101–111)
CO2: 27 mmol/L (ref 22–32)
Calcium: 9.2 mg/dL (ref 8.9–10.3)
Creatinine, Ser: 0.57 mg/dL (ref 0.44–1.00)
GFR calc Af Amer: >60 mL/min (ref >60)
GLUCOSE: 109 mg/dL — AB (ref 65–99)
POTASSIUM: 3.7 mmol/L (ref 3.5–5.1)
SODIUM: 143 mmol/L (ref 135–145)

## 2015-07-13 LAB — LIPASE, BLOOD: LIPASE: 42 U/L (ref 11–51)

## 2015-07-13 LAB — TROPONIN I: Troponin I: <0.03 ng/mL (ref <0.031)

## 2015-07-13 MED ORDER — GI COCKTAIL ~~LOC~~
30.0000 mL | Freq: Once | ORAL | Status: AC
  Administered 2015-07-13: 30 mL via ORAL
  Filled 2015-07-13: qty 30

## 2015-07-13 MED ORDER — PANTOPRAZOLE SODIUM 20 MG PO TBEC: 20.0000 mg | DELAYED_RELEASE_TABLET | Freq: Every day | ORAL | Status: DC

## 2015-07-13 MED ORDER — LORAZEPAM 2 MG/ML IJ SOLN
1.0000 mg | Freq: Once | INTRAMUSCULAR | Status: AC
  Administered 2015-07-13: 1 mg via INTRAVENOUS
  Filled 2015-07-13: qty 1

## 2015-07-13 MED ORDER — ONDANSETRON HCL 4 MG/2ML IJ SOLN
4.0000 mg | Freq: Once | INTRAMUSCULAR | Status: AC
  Administered 2015-07-13: 4 mg via INTRAVENOUS
  Filled 2015-07-13: qty 2

## 2015-07-13 NOTE — ED Notes (Signed)
MD at bedside. 

## 2015-07-13 NOTE — ED Notes (Signed)
Patient transported to X-ray 

## 2015-07-13 NOTE — ED Notes (Signed)
Called lab about troponin, states it should result within next 15 minutes.

## 2015-07-13 NOTE — ED Notes (Signed)
Called lab re: repeat troponin. Lab states they had a problem with machine and are going to run it soon.

## 2015-07-13 NOTE — ED Provider Notes (Signed)
Time Seen: Approximately 0 340  I have reviewed the triage notes  Chief Complaint: Chest Pain   History of Present Illness: Mandy Baldwin is a 55 y.o. female who presents with acute onset of substernal chest discomfort started approximately 2:00 this morning. Patient states she went out to go to the bathroom and noticed some substernal chest discomfort without radiation to the arm or jaw area. Patient denies any nausea initially. She denies any diaphoresis or shortness of breath. She states she felt like her heart was racing. She denies any fever or chills or productive cough. She denies any pulmonary emboli risk factors   Past Medical History  Diagnosis Date  . TMJ (dislocation of temporomandibular joint)     Patient Active Problem List   Diagnosis Date Noted  . Bronchitis, acute 06/27/2015  . Internal hemorrhoid, bleeding 06/27/2015  . Vaginal candidiasis 06/27/2015  . 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 Avitaminosis D 02/07/2015  . Body mass index of 60 or higher (HCC) 02/07/2015  . Gravida 0 02/07/2015  . Abnormal glucose 02/07/2015  . Morbid obesity (HCC) 12/14/2014  . Irregular menses 12/14/2014  . Shingles 12/14/2014  . Vitamin D deficiency 12/14/2014  . Ocular migraine 12/14/2014  . Knee pain, right 12/14/2014    Past Surgical History  Procedure Laterality Date  . Laparoscopic ovarian      Past Surgical History  Procedure Laterality Date  . Laparoscopic ovarian      Current Outpatient Rx  Name  Route  Sig  Dispense  Refill  . cholecalciferol (VITAMIN D) 1000 UNITS tablet   Oral   Take 2,000 Units by mouth daily.         . Cinnamon 500 MG capsule   Oral   Take 500 mg by mouth daily.         Marland Kitchen etodolac (LODINE) 400 MG tablet   Oral   Take 400 mg by mouth 2 (two) times daily.         . Multiple Vitamins-Minerals (VISION-VITE PRESERVE PO)   Oral   Take by mouth.         . Omega-3 Fatty Acids (FISH OIL) 1000 MG CAPS    Oral   Take 2 capsules by mouth daily.         Marland Kitchen azithromycin (ZITHROMAX) 250 MG tablet      2 today and then one a day for 4 more days. Patient not taking: Reported on 07/13/2015   6 tablet   0   . fluconazole (DIFLUCAN) 150 MG tablet   Oral   Take 1 tablet (150 mg total) by mouth once. Patient not taking: Reported on 07/13/2015   1 tablet   0   . pantoprazole (PROTONIX) 20 MG tablet   Oral   Take 1 tablet (20 mg total) by mouth daily.   30 tablet   1     Allergies:  Review of patient's allergies indicates no known allergies.  Family History: Family History  Problem Relation Age of Onset  . Dementia Mother   . COPD Father   . Breast cancer Sister 43  . Aortic aneurysm Mother     Social History: Social History  Substance Use Topics  . Smoking status: Never Smoker   . Smokeless tobacco: Never Used  . Alcohol Use: No     Review of Systems:   10 point review of systems was performed and was otherwise negative:  Constitutional: No fever Eyes: No visual disturbances ENT: No  sore throat, ear pain Cardiac: Chest pain described above as an aching discomfort. Respiratory: No shortness of breath, wheezing, or stridor Abdomen: No abdominal pain, no vomiting, No diarrhea Endocrine: No weight loss, No night sweats Extremities: No peripheral edema, cyanosis Skin: No rashes, easy bruising Neurologic: No focal weakness, trouble with speech or swollowing Urologic: No dysuria, Hematuria, or urinary frequency   Physical Exam:  ED Triage Vitals  Enc Vitals Group     BP 07/13/15 0333 174/112 mmHg     Pulse Rate 07/13/15 0333 107     Resp 07/13/15 0333 18     Temp 07/13/15 0333 98 F (36.7 C)     Temp Source 07/13/15 0333 Oral     SpO2 07/13/15 0333 96 %     Weight 07/13/15 0333 280 lb (127.007 kg)     Height 07/13/15 0333 5\' 3"  (1.6 m)     Head Cir --      Peak Flow --      Pain Score 07/13/15 0332 6     Pain Loc --      Pain Edu? --      Excl. in GC? --      General: Awake , Alert , and Oriented times 3; GCS 15 Head: Normal cephalic , atraumatic Eyes: Pupils equal , round, reactive to light Nose/Throat: No nasal drainage, patent upper airway without erythema or exudate.  Neck: Supple, Full range of motion, No anterior adenopathy or palpable thyroid masses Lungs: Clear to ascultation without wheezes , rhonchi, or rales Heart: Regular rate, regular rhythm without murmurs , gallops , or rubs Abdomen: Soft, non tender without rebound, guarding , or rigidity; bowel sounds positive and symmetric in all 4 quadrants. No organomegaly .        Extremities: 2 plus symmetric pulses. No edema, clubbing or cyanosis Neurologic: normal ambulation, Motor symmetric without deficits, sensory intact Skin: warm, dry, no rashes   Labs:   All laboratory work was reviewed including any pertinent negatives or positives listed below:  Labs Reviewed  BASIC METABOLIC PANEL - Abnormal; Notable for the following:    Glucose, Bld 109 (*)    All other components within normal limits  CBC  TROPONIN I  LIPASE, BLOOD  TROPONIN I   initial troponin is negative with repeat pending  EKG:  ED ECG REPORT I, Jennye Moccasin, the attending physician, personally viewed and interpreted this ECG.  Date: 07/13/2015 EKG Time:  327 Rate: 113* Rhythm: normal sinus rhythm QRS Axis: normal Intervals: normal ST/T Wave abnormalities: normal Conduction Disturbances: none Narrative Interpretation: unremarkable No acute ischemic changes   Radiology:    CLINICAL DATA: Chest pain for 2 weeks, nausea.  EXAM: CHEST 2 VIEW  COMPARISON: Chest radiograph November 25, 2009  FINDINGS: Cardiomediastinal silhouette is normal. The lungs are clear without pleural effusions or focal consolidations. Trachea projects midline and there is no pneumothorax. Soft tissue planes and included osseous structures are non-suspicious. Large body habitus.  IMPRESSION: Normal  chest.    I personally reviewed the radiologic studies       ED Course:  Differential includes all life-threatening causes for chest pain. This includes but is not exclusive to acute coronary syndrome, aortic dissection, pulmonary embolism, cardiac tamponade, community-acquired pneumonia, pericarditis, musculoskeletal chest wall pain, etc. Given the patient's current clinical presentation and objective findings with her BUN on ketorolac, etc. I felt this was most likely esophageal reflux disease. She has minimal cardiac risk factors. EKG and troponin initial studies were  negative. She had an episode and seemed to be a vagal syndrome with swelling of the heart rate became very diaphoretic and her heart rate dropped into the 50s but otherwise she's remained in normal sinus rhythm through her stay. There also seems to be an anxiety component and she was given Zofran and Ativan here in emergency department. She is awaiting her second troponin to more thoroughly screen her for acute coronary syndrome. I suspect it'll be negative and the patient was prescribed proton next on an outpatient basis.  She was advised her workup is not complete and she needs to contact her primary physician and possible outpatient cardiac studies such as an exercise treadmill test etc. She is advised take a baby aspirin a day.    Assessment:  Acute unspecified chest pain  Final Clinical Impression:  Final diagnoses:  Chest pain, unspecified     Plan: * Follow second troponin. Patient was advised to return immediately if condition worsens. Patient was advised to follow up with their primary care physician or other specialized physicians involved in their outpatient care            Jennye Moccasin, MD 07/13/15 [phone]

## 2015-07-13 NOTE — ED Notes (Signed)
Discussed discharge instructions, prescriptions, and follow-up care with patient. No questions or concerns at this time. Pt stable at discharge.  

## 2015-07-13 NOTE — Discharge Instructions (Signed)

## 2015-07-13 NOTE — ED Provider Notes (Signed)
I was asked to follow up on the second troponin and if normal to discharge the patient with outpatient follow-up. Her second troponin is unchanged. The patient is pain-free. She has outpatient follow-up with her physician. She denies shortness of breath or pleurisy. No recent travel. No calf pain.  Patient agrees with discharge. Return precautions discussed with her  Jene Every, MD 07/13/15 [phone]

## 2015-07-14 ENCOUNTER — Encounter: Payer: Self-pay | Admitting: Family Medicine

## 2015-07-14 ENCOUNTER — Ambulatory Visit (INDEPENDENT_AMBULATORY_CARE_PROVIDER_SITE_OTHER): Payer: PRIVATE HEALTH INSURANCE | Admitting: Family Medicine

## 2015-07-14 VITALS — BP 140/82 | HR 88 | Temp 98.0°F | Resp 16 | Wt 291.0 lb

## 2015-07-14 DIAGNOSIS — K219 Gastro-esophageal reflux disease without esophagitis: Secondary | ICD-10-CM

## 2015-07-14 DIAGNOSIS — I10 Essential (primary) hypertension: Secondary | ICD-10-CM

## 2015-07-14 DIAGNOSIS — R0789 Other chest pain: Secondary | ICD-10-CM

## 2015-07-14 MED ORDER — HYDROCHLOROTHIAZIDE 12.5 MG PO TABS: 12.5000 mg | ORAL_TABLET | Freq: Every day | ORAL | Status: DC

## 2015-07-14 NOTE — Progress Notes (Signed)
Subjective:    Patient ID: Mandy Baldwin, female    DOB: [birth year], 55 y.o.   MRN: [id number]  HPI   Follow up Hospitalization  Patient was admitted to [facility] ED on 07/13/2015 and discharged on same day. She was treated for chest pain; diagnosed with GERD. Treatment for this included CXR, labs, EKG (all negative). Pt put on Protonix. She reports good compliance with treatment. She reports this condition is Improved.  ------------------------------------------------------------------------------------    Review of Systems  Constitutional: Negative for fever, chills, diaphoresis, activity change, appetite change, fatigue and unexpected weight change.  Respiratory: Negative for cough, shortness of breath and wheezing.   Cardiovascular: Positive for chest pain (improved from yesterday). Negative for palpitations and leg swelling.  Neurological: Positive for headaches.   BP 140/82 mmHg  Pulse 88  Temp(Src) 98 F (36.7 C) (Oral)  Resp 16  Wt 291 lb (131.997 kg)   Patient Active Problem List   Diagnosis Date Noted  . Bronchitis, acute 06/27/2015  . Internal hemorrhoid, bleeding 06/27/2015  . Vaginal candidiasis 06/27/2015  . 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 Avitaminosis D 02/07/2015  . Body mass index of 60 or higher (HCC) 02/07/2015  . Gravida 0 02/07/2015  . Abnormal glucose 02/07/2015  . Morbid obesity (HCC) 12/14/2014  . Irregular menses 12/14/2014  . Shingles 12/14/2014  . Vitamin D deficiency 12/14/2014  . Ocular migraine 12/14/2014  . Knee pain, right 12/14/2014   Past Medical History  Diagnosis Date  . TMJ (dislocation of temporomandibular joint)    Current Outpatient Prescriptions on File Prior to Visit  Medication Sig  . cholecalciferol (VITAMIN D) 1000 UNITS tablet Take 2,000 Units by mouth daily.  . Cinnamon 500 MG capsule Take 500 mg by mouth daily.  Marland Kitchen etodolac (LODINE) 400 MG tablet Take 400 mg by mouth 2 (two) times daily.  .  Multiple Vitamins-Minerals (VISION-VITE PRESERVE PO) Take by mouth.  . Omega-3 Fatty Acids (FISH OIL) 1000 MG CAPS Take 2 capsules by mouth daily.  . pantoprazole (PROTONIX) 20 MG tablet Take 1 tablet (20 mg total) by mouth daily.   No current facility-administered medications on file prior to visit.   No Known Allergies Past Surgical History  Procedure Laterality Date  . Laparoscopic ovarian     Social History   Social History  . Marital Status: Married    Spouse Name: Isabel Ardila  . Number of Children: 0  . Years of Education: 13   Occupational History  . Customer Service     [employer]   Social History Main Topics  . Smoking status: Never Smoker   . Smokeless tobacco: Never Used  . Alcohol Use: No  . Drug Use: No  . Sexual Activity: Not on file   Other Topics Concern  . Not on file   Social History Narrative   Family History  Problem Relation Age of Onset  . Dementia Mother   . COPD Father   . Breast cancer Sister 44  . Aortic aneurysm Mother       Objective:   Physical Exam  Constitutional: She is oriented to person, place, and time. She appears well-developed and well-nourished.  Cardiovascular: Normal rate and regular rhythm.   Pulmonary/Chest: Effort normal and breath sounds normal.  Neurological: She is alert and oriented to person, place, and time.  Psychiatric: She has a normal mood and affect. Her behavior is normal. Judgment and thought content normal.   BP 140/82 mmHg  Pulse 88  Temp(Src)  98 F (36.7 C) (Oral)  Resp 16  Wt 291 lb (131.997 kg)      Assessment & Plan:  1. Gastroesophageal reflux disease without esophagitis New diagnosis made at hospital. Continue current medication and refer to GI to evaluate.   - Ambulatory referral to Gastroenterology  2. Atypical chest pain Suspect reflux. Does not want cardiology referral at this time. Will call if worsens. Continue ASA.   3. Morbid obesity, unspecified obesity type (HCC) Unchanged.  Will work on weight loss.   4. Essential hypertension New diagnosis. Has been borderline. Will start medication after ER visit yesterday. Concerned about sleep apnea, but ESS was only 2 and patient and husband want to wait on work up.  Did strongly encourage it, explained risks of not treating it, but they wish to proceed with just HCTZ for now. Recheck in 4 weeks.   - hydrochlorothiazide (HYDRODIURIL) 12.5 MG tablet; Take 1 tablet (12.5 mg total) by mouth daily.  Dispense: 90 tablet; Refill: 3   Patient was seen and examined by Leo Grosser, MD, and note scribed by Allene Dillon, CMA. I have reviewed the document for accuracy and completeness and I agree with above. Leo Grosser, MD   Lorie Phenix, MD

## 2015-07-15 ENCOUNTER — Inpatient Hospital Stay: Payer: PRIVATE HEALTH INSURANCE | Admitting: Family Medicine

## 2015-07-16 ENCOUNTER — Telehealth: Payer: Self-pay | Admitting: Family Medicine

## 2015-07-16 DIAGNOSIS — N6459 Other signs and symptoms in breast: Secondary | ICD-10-CM

## 2015-07-16 DIAGNOSIS — N6310 Unspecified lump in the right breast, unspecified quadrant: Secondary | ICD-10-CM

## 2015-07-16 NOTE — Telephone Encounter (Signed)
Ok to order. Thanks.   

## 2015-07-16 NOTE — Telephone Encounter (Signed)
Pt is requesting a 6 month mammogram follow up scheduled on 07/24/2015 (late afternoon) at [place].  CB#  [phone]

## 2015-07-17 ENCOUNTER — Telehealth: Payer: Self-pay | Admitting: Family Medicine

## 2015-07-17 ENCOUNTER — Other Ambulatory Visit: Payer: Self-pay

## 2015-07-17 DIAGNOSIS — N6310 Unspecified lump in the right breast, unspecified quadrant: Secondary | ICD-10-CM

## 2015-07-17 NOTE — Telephone Encounter (Signed)
Per [facility] pt needs uni right diagnostic mammogram.Not bilateral

## 2015-07-17 NOTE — Telephone Encounter (Signed)
Changed diagnosis association. Mandy Baldwin, CMA

## 2015-07-17 NOTE — Telephone Encounter (Signed)
Changed diagnosis code. Mandy Baldwin, CMA

## 2015-07-17 NOTE — Telephone Encounter (Signed)
Thanks for putting a new order in but it says breast mass found on mammogram and per [facility] last mammogram is normal.I think the mass was found on exam in office.Please change diagnosis

## 2015-08-03 ENCOUNTER — Other Ambulatory Visit: Payer: Self-pay | Admitting: Family Medicine

## 2015-08-03 DIAGNOSIS — M26609 Unspecified temporomandibular joint disorder, unspecified side: Secondary | ICD-10-CM

## 2015-08-03 DIAGNOSIS — G43109 Migraine with aura, not intractable, without status migrainosus: Secondary | ICD-10-CM

## 2015-08-04 ENCOUNTER — Telehealth: Payer: Self-pay | Admitting: Family Medicine

## 2015-08-04 NOTE — Telephone Encounter (Signed)
Pt called saying she has been on two medications and it's making her nauseated.  One acid reflux and one for blood pressure.  Has an appt. On the 7th Feb  Please advise.  [phone]  Thanks Barth Kirks

## 2015-08-04 NOTE — Telephone Encounter (Signed)
Made follow up visit for 08/05/2015 at 12pm  Thanks,   -Vernona Rieger

## 2015-08-05 ENCOUNTER — Ambulatory Visit (INDEPENDENT_AMBULATORY_CARE_PROVIDER_SITE_OTHER): Payer: PRIVATE HEALTH INSURANCE | Admitting: Family Medicine

## 2015-08-05 ENCOUNTER — Telehealth: Payer: Self-pay | Admitting: Family Medicine

## 2015-08-05 ENCOUNTER — Encounter: Payer: Self-pay | Admitting: Family Medicine

## 2015-08-05 VITALS — BP 146/94 | HR 84 | Temp 98.0°F | Resp 16 | Wt 288.0 lb

## 2015-08-05 DIAGNOSIS — I10 Essential (primary) hypertension: Secondary | ICD-10-CM

## 2015-08-05 DIAGNOSIS — R110 Nausea: Secondary | ICD-10-CM | POA: Diagnosis not present

## 2015-08-05 MED ORDER — LISINOPRIL 10 MG PO TABS: 10.0000 mg | ORAL_TABLET | Freq: Every day | ORAL | Status: DC

## 2015-08-05 NOTE — Telephone Encounter (Signed)
Advised pt as below. Jun Osment Drozdowski, CMA  

## 2015-08-05 NOTE — Telephone Encounter (Signed)
Pt is asking if it is better to take her blood pressure and acid reflux medication ant night or in the morning.  Does she need to take them at separate times of the day.  WJ#[phone]

## 2015-08-05 NOTE — Progress Notes (Signed)
Subjective:    Patient ID: Mandy Baldwin, female    DOB: 1960/06/08, 55 y.o.   MRN: [id number]  HPI  Nausea Pt report she has been nauseous since starting HCTZ and Protonix. States pharmacy informed pt to take medications on empty stomach, which caused nausea to be so severe that she vomited. Pt states she tried taking medications with a bowl of cereal this morning, which improved nausea some. Pt takes Zofran for sx, with relief.  Hypertension: Patient here for follow-up of elevated blood pressure. She sometimes walking for exercise and is adherent to low salt diet.  Blood pressure is not well controlled at home. Cardiac symptoms none. Patient denies chest pain, chest pressure/discomfort, claudication, dyspnea, exertional chest pressure/discomfort, fatigue, irregular heart beat, lower extremity edema, near-syncope, orthopnea, palpitations and syncope.  Cardiovascular risk factors: hypertension and obesity (BMI >= 30 kg/m2).  Pt was seen on 07/14/2015. BP at LOV was 140/82. Pt was started on HCTZ 12.5 mg po qd.  Review of Systems  Constitutional: Positive for appetite change. Negative for fever, chills, diaphoresis, activity change and fatigue.  Respiratory: Negative for chest tightness, shortness of breath and wheezing.   Cardiovascular: Negative for chest pain, palpitations and leg swelling.  Gastrointestinal: Positive for nausea.   BP 152/102 mmHg  Pulse 84  Temp(Src) 98 F (36.7 C) (Oral)  Resp 16  Wt 288 lb (130.636 kg)   Patient Active Problem List   Diagnosis Date Noted  . Mass of right breast on mammogram 07/16/2015  . GERD (gastroesophageal reflux disease) 07/14/2015  . Hypertension 07/14/2015  . Internal hemorrhoid, bleeding 06/27/2015  . 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 Avitaminosis D 02/07/2015  . Body mass index of 60 or higher (HCC) 02/07/2015  . Gravida 0 02/07/2015  . Abnormal glucose 02/07/2015  . Morbid obesity (HCC) 12/14/2014  .  Irregular menses 12/14/2014  . Shingles 12/14/2014  . Vitamin D deficiency 12/14/2014  . Ocular migraine 12/14/2014  . Knee pain, right 12/14/2014   Past Medical History  Diagnosis Date  . TMJ (dislocation of temporomandibular joint)    Current Outpatient Prescriptions on File Prior to Visit  Medication Sig  . cholecalciferol (VITAMIN D) 1000 UNITS tablet Take 2,000 Units by mouth daily.  . Cinnamon 500 MG capsule Take 500 mg by mouth daily.  Marland Kitchen etodolac (LODINE) 400 MG tablet Take 400 mg by mouth 2 (two) times daily.  . hydrochlorothiazide (HYDRODIURIL) 12.5 MG tablet Take 1 tablet (12.5 mg total) by mouth daily.  . Multiple Vitamins-Minerals (VISION-VITE PRESERVE PO) Take by mouth.  . Omega-3 Fatty Acids (FISH OIL) 1000 MG CAPS Take 2 capsules by mouth daily.  . ondansetron (ZOFRAN-ODT) 4 MG disintegrating tablet DISSOLVE ONE TABLET IN MOUTH EVERY 8 HOURS AS NEEDED FOR NAUSEA AND VOMITING  . pantoprazole (PROTONIX) 20 MG tablet Take 1 tablet (20 mg total) by mouth daily.  . Magnesium 250 MG TABS Take by mouth. Reported on 08/05/2015   No current facility-administered medications on file prior to visit.   No Known Allergies Past Surgical History  Procedure Laterality Date  . Laparoscopic ovarian     Social History   Social History  . Marital Status: Married    Spouse Name: Lasonja Lakins  . Number of Children: 0  . Years of Education: 13   Occupational History  . Customer Service     [employer]   Social History Main Topics  . Smoking status: Never Smoker   . Smokeless tobacco: Never Used  .  Alcohol Use: No  . Drug Use: No  . Sexual Activity: Not on file   Other Topics Concern  . Not on file   Social History Narrative   Family History  Problem Relation Age of Onset  . Dementia Mother   . COPD Father   . Breast cancer Sister 68  . Aortic aneurysm Mother        Objective:   Physical Exam  Constitutional: She appears well-developed and well-nourished.    Cardiovascular: Normal rate, regular rhythm and normal heart sounds.   Pulmonary/Chest: Effort normal and breath sounds normal. No respiratory distress.  Musculoskeletal: She exhibits no edema.  Psychiatric: She has a normal mood and affect. Her behavior is normal.      Assessment & Plan:  1. Essential hypertension Worsening. Start Lisinopril as below. FU 6 weeks. Will check blood work at that time. Call before appointment if BP/sx worsen. - lisinopril (PRINIVIL,ZESTRIL) 10 MG tablet; Take 1 tablet (10 mg total) by mouth daily.  Dispense: 30 tablet; Refill: 3  2. Nausea Continue taking medications with food and continue Zofran as needed.  Patient seen and examined by Leo Grosser, MD, and note scribed by Allene Dillon, CMA.  I have reviewed the document for accuracy and completeness and I agree with above. Leo Grosser, MD   Lorie Phenix, MD

## 2015-08-05 NOTE — Telephone Encounter (Signed)
Can take them all in the am. Thanks.

## 2015-08-12 ENCOUNTER — Ambulatory Visit: Payer: PRIVATE HEALTH INSURANCE | Admitting: Family Medicine

## 2015-08-19 ENCOUNTER — Ambulatory Visit: Payer: No Typology Code available for payment source

## 2015-08-19 ENCOUNTER — Other Ambulatory Visit: Payer: No Typology Code available for payment source

## 2015-08-26 ENCOUNTER — Other Ambulatory Visit: Payer: Self-pay

## 2015-08-26 ENCOUNTER — Ambulatory Visit (INDEPENDENT_AMBULATORY_CARE_PROVIDER_SITE_OTHER): Payer: PRIVATE HEALTH INSURANCE | Admitting: Gastroenterology

## 2015-08-26 ENCOUNTER — Ambulatory Visit: Payer: PRIVATE HEALTH INSURANCE | Admitting: Gastroenterology

## 2015-08-26 ENCOUNTER — Encounter: Payer: Self-pay | Admitting: Gastroenterology

## 2015-08-26 VITALS — BP 124/80 | HR 92 | Temp 98.4°F | Ht 63.0 in | Wt 283.0 lb

## 2015-08-26 DIAGNOSIS — R112 Nausea with vomiting, unspecified: Secondary | ICD-10-CM

## 2015-08-26 DIAGNOSIS — K219 Gastro-esophageal reflux disease without esophagitis: Secondary | ICD-10-CM

## 2015-08-26 DIAGNOSIS — R1115 Cyclical vomiting syndrome unrelated to migraine: Secondary | ICD-10-CM

## 2015-08-26 NOTE — Progress Notes (Signed)
Primary Care Physician: Lorie PhenixNancy Maloney, MD  Primary Gastroenterologist:  Dr. Midge Miniumarren Jodell Weitman  Chief Complaint  Patient presents with  . Gastroesophageal Reflux    HPI: Mandy SawyersRose Marie Baldwin is a 55 y.o. female here because of chronic nausea. The patient reports that she has been having nausea for approximate 4 weeks. The patient was seen in the emergency room because she states she had chest pain that had a negative cardiac workup therefore she was told that is likely reflux. The patient is taking anti-inflammatory medication for joint pain. She also states that she took pantoprazole that made her feel worse. There is no report of any vomiting with the nausea. She also denies any change in bowel habits. The patient has had a colonoscopy by me in the past. There is no report of any black stools or bloody stools. She also reports that she had an episode of worsening symptoms this past weekend when she had a cup of coffee.  Current Outpatient Prescriptions  Medication Sig Dispense Refill  . cholecalciferol (VITAMIN D) 1000 UNITS tablet Take 2,000 Units by mouth daily.    . Cinnamon 500 MG capsule Take 500 mg by mouth daily.    Marland Kitchen. etodolac (LODINE) 400 MG tablet Take 400 mg by mouth 2 (two) times daily.    . hydrochlorothiazide (HYDRODIURIL) 12.5 MG tablet Take 1 tablet (12.5 mg total) by mouth daily. 90 tablet 3  . lisinopril (PRINIVIL,ZESTRIL) 10 MG tablet Take 1 tablet (10 mg total) by mouth daily. 30 tablet 3  . Magnesium 250 MG TABS Take by mouth. Reported on 08/05/2015    . Multiple Vitamins-Minerals (VISION-VITE PRESERVE PO) Take by mouth.    . Omega-3 Fatty Acids (FISH OIL) 1000 MG CAPS Take 2 capsules by mouth daily.    . ondansetron (ZOFRAN-ODT) 4 MG disintegrating tablet DISSOLVE ONE TABLET IN MOUTH EVERY 8 HOURS AS NEEDED FOR NAUSEA AND VOMITING 20 tablet 0  . pantoprazole (PROTONIX) 20 MG tablet Take 1 tablet (20 mg total) by mouth daily. (Patient not taking: Reported on 08/26/2015) 30  tablet 1   No current facility-administered medications for this visit.    Allergies as of 08/26/2015  . (No Known Allergies)    ROS:  General: Negative for anorexia, weight loss, fever, chills, fatigue, weakness. ENT: Negative for hoarseness, difficulty swallowing , nasal congestion. CV: Negative for chest pain, angina, palpitations, dyspnea on exertion, peripheral edema.  Respiratory: Negative for dyspnea at rest, dyspnea on exertion, cough, sputum, wheezing.  GI: See history of present illness. GU:  Negative for dysuria, hematuria, urinary incontinence, urinary frequency, nocturnal urination.  Endo: Negative for unusual weight change.    Physical Examination:   BP 124/80 mmHg  Pulse 92  Temp(Src) 98.4 F (36.9 C) (Oral)  Ht 5\' 3"  (1.6 m)  Wt 283 lb (128.368 kg)  BMI 50.14 kg/m2  General: Well-nourished, well-developed in no acute distress.  Eyes: No icterus. Conjunctivae pink. Mouth: Oropharyngeal mucosa moist and pink , no lesions erythema or exudate. Lungs: Clear to auscultation bilaterally. Non-labored. Heart: Regular rate and rhythm, no murmurs rubs or gallops.  Abdomen: Bowel sounds are normal, nontender, nondistended, no hepatosplenomegaly or masses, no abdominal bruits or hernia , no rebound or guarding.   Extremities: No lower extremity edema. No clubbing or deformities. Neuro: Alert and oriented x 3.  Grossly intact. Skin: Warm and dry, no jaundice.   Psych: Alert and cooperative, normal mood and affect.  Labs:    Imaging Studies: No results found.  Assessment  and Plan:   Mandy Baldwin is a 55 y.o. y/o female who comes in today with signs and symptoms of possible reflux. This is possibly the cause of her nausea. The patient will be set up for an EGD. She will also be started on a trial of Dexilant 60 mg a day.I have discussed risks & benefits which include, but are not limited to, bleeding, infection, perforation & drug reaction.  The patient agrees  with this plan & written consent will be obtained.      Note: This dictation was prepared with Dragon dictation along with smaller phrase technology. Any transcriptional errors that result from this process are unintentional.

## 2015-08-27 ENCOUNTER — Telehealth: Payer: Self-pay | Admitting: Gastroenterology

## 2015-08-27 NOTE — Telephone Encounter (Signed)
Patient has some questions regarding scheduling.

## 2015-08-27 NOTE — Telephone Encounter (Signed)
Scheduling questions answered. Pt concerned about getting mammogram and colonoscopy on the same day.

## 2015-08-29 ENCOUNTER — Telehealth: Payer: Self-pay | Admitting: Family Medicine

## 2015-08-29 NOTE — Telephone Encounter (Signed)
Irving Burtonmily spoke with patient. No fever. No abdominal pain. Nausea has been chronic. Saw Dr. Servando SnareWohl this week. Suspect viral.  Told to touch call Dr. Annabell SabalWohl's office to see if they concur or recommend other treatment.    Let message by me to call back again if any further concerns.

## 2015-08-29 NOTE — Telephone Encounter (Signed)
Patient called back. Can not eat secondary to nausea. Started on Dexilant.  Just can not eat.   Could not make it back to work.   Did have some diarrhea today.  Did have mustard colored stool.  Has EGD scheduled.  Did speak with GI today also.  Warned to call back to if has any fevers,  Black tarry stools, etc.  Thanks.

## 2015-08-29 NOTE — Telephone Encounter (Signed)
Pt states she is eating a very little and having yellow diarrhea that started today. Pt is asking if this is from her medication she is taking or something else.    Pt is asking if she can be worked in today.  OZ#[phone]/QVCB#[phone]/MW

## 2015-09-08 ENCOUNTER — Other Ambulatory Visit: Payer: Self-pay

## 2015-09-08 DIAGNOSIS — K2100 Gastro-esophageal reflux disease with esophagitis, without bleeding: Secondary | ICD-10-CM

## 2015-09-08 MED ORDER — DEXLANSOPRAZOLE 60 MG PO CPDR: 60.0000 mg | DELAYED_RELEASE_CAPSULE | Freq: Every day | ORAL | Status: DC

## 2015-09-12 ENCOUNTER — Encounter: Payer: Self-pay | Admitting: *Deleted

## 2015-09-16 ENCOUNTER — Ambulatory Visit: Payer: PRIVATE HEALTH INSURANCE | Admitting: Family Medicine

## 2015-09-17 NOTE — Discharge Instructions (Signed)

## 2015-09-18 ENCOUNTER — Ambulatory Visit (INDEPENDENT_AMBULATORY_CARE_PROVIDER_SITE_OTHER): Payer: PRIVATE HEALTH INSURANCE | Admitting: Family Medicine

## 2015-09-18 ENCOUNTER — Ambulatory Visit: Payer: No Typology Code available for payment source | Admitting: Anesthesiology

## 2015-09-18 ENCOUNTER — Ambulatory Visit
Admission: RE | Admit: 2015-09-18 | Discharge: 2015-09-18 | Disposition: A | Discharge status: Home / Self Care | Payer: No Typology Code available for payment source | Source: Ambulatory Visit | Attending: Gastroenterology | Admitting: Gastroenterology

## 2015-09-18 ENCOUNTER — Other Ambulatory Visit: Payer: No Typology Code available for payment source

## 2015-09-18 ENCOUNTER — Ambulatory Visit: Payer: PRIVATE HEALTH INSURANCE | Admitting: Family Medicine

## 2015-09-18 ENCOUNTER — Ambulatory Visit: Payer: No Typology Code available for payment source

## 2015-09-18 ENCOUNTER — Encounter: Payer: Self-pay | Admitting: Family Medicine

## 2015-09-18 ENCOUNTER — Encounter
Admission: RE | Disposition: A | Discharge status: Home / Self Care | Payer: Self-pay | Source: Ambulatory Visit | Attending: Gastroenterology

## 2015-09-18 VITALS — BP 108/76 | HR 96 | Temp 97.9°F | Resp 16 | Ht 63.0 in | Wt 278.0 lb

## 2015-09-18 DIAGNOSIS — R12 Heartburn: Secondary | ICD-10-CM | POA: Diagnosis not present

## 2015-09-18 DIAGNOSIS — K222 Esophageal obstruction: Secondary | ICD-10-CM | POA: Insufficient documentation

## 2015-09-18 DIAGNOSIS — Z79899 Other long term (current) drug therapy: Secondary | ICD-10-CM | POA: Diagnosis not present

## 2015-09-18 DIAGNOSIS — E785 Hyperlipidemia, unspecified: Secondary | ICD-10-CM | POA: Diagnosis not present

## 2015-09-18 DIAGNOSIS — I10 Essential (primary) hypertension: Secondary | ICD-10-CM | POA: Diagnosis not present

## 2015-09-18 DIAGNOSIS — Z803 Family history of malignant neoplasm of breast: Secondary | ICD-10-CM | POA: Insufficient documentation

## 2015-09-18 DIAGNOSIS — Z825 Family history of asthma and other chronic lower respiratory diseases: Secondary | ICD-10-CM | POA: Insufficient documentation

## 2015-09-18 DIAGNOSIS — Z6841 Body Mass Index (BMI) 40.0 and over, adult: Secondary | ICD-10-CM

## 2015-09-18 DIAGNOSIS — Z820 Family history of epilepsy and other diseases of the nervous system: Secondary | ICD-10-CM | POA: Diagnosis not present

## 2015-09-18 DIAGNOSIS — K2970 Gastritis, unspecified, without bleeding: Secondary | ICD-10-CM | POA: Diagnosis not present

## 2015-09-18 DIAGNOSIS — R1310 Dysphagia, unspecified: Secondary | ICD-10-CM | POA: Insufficient documentation

## 2015-09-18 DIAGNOSIS — R110 Nausea: Secondary | ICD-10-CM | POA: Insufficient documentation

## 2015-09-18 DIAGNOSIS — K219 Gastro-esophageal reflux disease without esophagitis: Secondary | ICD-10-CM | POA: Diagnosis not present

## 2015-09-18 DIAGNOSIS — Z8249 Family history of ischemic heart disease and other diseases of the circulatory system: Secondary | ICD-10-CM | POA: Insufficient documentation

## 2015-09-18 DIAGNOSIS — K2950 Unspecified chronic gastritis without bleeding: Secondary | ICD-10-CM | POA: Insufficient documentation

## 2015-09-18 DIAGNOSIS — M170 Bilateral primary osteoarthritis of knee: Secondary | ICD-10-CM | POA: Insufficient documentation

## 2015-09-18 HISTORY — DX: Motion sickness, initial encounter: T75.3XXA

## 2015-09-18 HISTORY — PX: ESOPHAGOGASTRODUODENOSCOPY (EGD) WITH PROPOFOL: SHX5813

## 2015-09-18 HISTORY — DX: Bilateral primary osteoarthritis of knee: M17.0

## 2015-09-18 SURGERY — ESOPHAGOGASTRODUODENOSCOPY (EGD) WITH PROPOFOL
Anesthesia: Monitor Anesthesia Care

## 2015-09-18 MED ORDER — GLYCOPYRROLATE 0.2 MG/ML IJ SOLN
INTRAMUSCULAR | Status: DC | PRN
  Administered 2015-09-18: 0.2 mg via INTRAVENOUS

## 2015-09-18 MED ORDER — OXYCODONE HCL 5 MG/5ML PO SOLN: 5.0000 mg | Freq: Once | ORAL | Status: DC | PRN

## 2015-09-18 MED ORDER — LISINOPRIL-HYDROCHLOROTHIAZIDE 10-12.5 MG PO TABS
1.0000 | ORAL_TABLET | Freq: Every day | ORAL | Status: DC
Start: 2015-09-18 — End: 2015-10-23

## 2015-09-18 MED ORDER — PROPOFOL 10 MG/ML IV BOLUS
INTRAVENOUS | Status: DC | PRN
  Administered 2015-09-18 (×2): 30 mg via INTRAVENOUS
  Administered 2015-09-18: 10 mg via INTRAVENOUS
  Administered 2015-09-18: 30 mg via INTRAVENOUS
  Administered 2015-09-18: 40 mg via INTRAVENOUS
  Administered 2015-09-18: 70 mg via INTRAVENOUS
  Administered 2015-09-18: 20 mg via INTRAVENOUS
  Administered 2015-09-18: 30 mg via INTRAVENOUS

## 2015-09-18 MED ORDER — LACTATED RINGERS IV SOLN
INTRAVENOUS | Status: DC
  Administered 2015-09-18: 09:00:00 via INTRAVENOUS

## 2015-09-18 MED ORDER — LIDOCAINE HCL (CARDIAC) 20 MG/ML IV SOLN
INTRAVENOUS | Status: DC | PRN
  Administered 2015-09-18: 40 mg via INTRAVENOUS

## 2015-09-18 MED ORDER — OXYCODONE HCL 5 MG PO TABS: 5.0000 mg | ORAL_TABLET | Freq: Once | ORAL | Status: DC | PRN

## 2015-09-18 MED ORDER — SODIUM CHLORIDE 0.9 % IV SOLN: INTRAVENOUS | Status: DC

## 2015-09-18 SURGICAL SUPPLY — 31 items
BALLN DILATOR 10-12 8 (BALLOONS)
BALLN DILATOR 12-15 8 (BALLOONS)
BALLN DILATOR 15-18 8 (BALLOONS)
BALLN DILATOR CRE 0-12 8 (BALLOONS)
BALLN DILATOR ESOPH 8 10 CRE (MISCELLANEOUS) IMPLANT
BALLOON DILATOR 12-15 8 (BALLOONS) IMPLANT
BALLOON DILATOR 15-18 8 (BALLOONS) IMPLANT
BALLOON DILATOR CRE 0-12 8 (BALLOONS) IMPLANT
BLOCK BITE 60FR ADLT L/F GRN (MISCELLANEOUS) ×2 IMPLANT
CANISTER SUCT 1200ML W/VALVE (MISCELLANEOUS) ×2 IMPLANT
CLIP HMST 235XBRD CATH ROT (MISCELLANEOUS) IMPLANT
CLIP RESOLUTION 360 11X235 (MISCELLANEOUS)
FCP ESCP3.2XJMB 240X2.8X (MISCELLANEOUS)
FORCEPS BIOP RAD 4 LRG CAP 4 (CUTTING FORCEPS) IMPLANT
FORCEPS BIOP RJ4 240 W/NDL (MISCELLANEOUS)
FORCEPS ESCP3.2XJMB 240X2.8X (MISCELLANEOUS) IMPLANT
GOWN CVR UNV OPN BCK APRN NK (MISCELLANEOUS) ×2 IMPLANT
GOWN ISOL THUMB LOOP REG UNIV (MISCELLANEOUS) ×4
INJECTOR VARIJECT VIN23 (MISCELLANEOUS) IMPLANT
KIT DEFENDO VALVE AND CONN (KITS) IMPLANT
KIT ENDO PROCEDURE OLY (KITS) ×2 IMPLANT
MARKER SPOT ENDO TATTOO 5ML (MISCELLANEOUS) IMPLANT
PAD GROUND ADULT SPLIT (MISCELLANEOUS) IMPLANT
SNARE SHORT THROW 13M SML OVAL (MISCELLANEOUS) IMPLANT
SNARE SHORT THROW 30M LRG OVAL (MISCELLANEOUS) IMPLANT
SPOT EX ENDOSCOPIC TATTOO (MISCELLANEOUS)
SYR INFLATION 60ML (SYRINGE) IMPLANT
VARIJECT INJECTOR VIN23 (MISCELLANEOUS)
WATER STERILE IRR 250ML POUR (IV SOLUTION) ×2 IMPLANT
WIDE-EYE POLYPTRAP (MISCELLANEOUS) IMPLANT
WIRE CRE 18-20MM 8CM F G (MISCELLANEOUS) IMPLANT

## 2015-09-18 NOTE — Anesthesia Preprocedure Evaluation (Signed)
Anesthesia Evaluation  Patient identified by MRN, date of birth, ID band Patient awake    Reviewed: Allergy & Precautions, H&P , NPO status , Patient's Chart, lab work & pertinent test results  Airway Mallampati: II  TM Distance: >3 FB Neck ROM: full    Dental no notable dental hx.    Pulmonary neg pulmonary ROS,    Pulmonary exam normal        Cardiovascular hypertension, On Medications Normal cardiovascular exam     Neuro/Psych    GI/Hepatic Neg liver ROS, GERD  Medicated,  Endo/Other  negative endocrine ROS  Renal/GU negative Renal ROS     Musculoskeletal   Abdominal   Peds  Hematology negative hematology ROS (+)   Anesthesia Other Findings   Reproductive/Obstetrics                             Anesthesia Physical Anesthesia Plan  ASA: II  Anesthesia Plan: MAC   Post-op Pain Management:    Induction:   Airway Management Planned:   Additional Equipment:   Intra-op Plan:   Post-operative Plan:   Informed Consent: I have reviewed the patients History and Physical, chart, labs and discussed the procedure including the risks, benefits and alternatives for the proposed anesthesia with the patient or authorized representative who has indicated his/her understanding and acceptance.     Plan Discussed with: CRNA  Anesthesia Plan Comments:         Anesthesia Quick Evaluation  

## 2015-09-18 NOTE — H&P (Signed)
[facility]  [address], [address] [place], [place] [address] Phone: [phone] Fax : [phone]  Primary Care Physician:  Lorie Phenix, MD Primary Gastroenterologist:  Dr. Servando Snare  Pre-Procedure History & Physical: HPI:  Mandy Baldwin is a 55 y.o. female is here for an endoscopy.   Past Medical History  Diagnosis Date  . TMJ (dislocation of temporomandibular joint)   . GERD (gastroesophageal reflux disease)   . Hypertension   . Hyperlipidemia   . Osteoarthritis of both knees   . Motion sickness     [place]    Past Surgical History  Procedure Laterality Date  . Laparoscopic ovarian    . Colonoscopy      Prior to Admission medications   Medication Sig Start Date End Date Taking? Authorizing Provider  cholecalciferol (VITAMIN D) 1000 UNITS tablet Take 2,000 Units by mouth daily.   Yes Historical Provider, MD  Cinnamon 500 MG capsule Take 500 mg by mouth daily.   Yes Historical Provider, MD  etodolac (LODINE) 400 MG tablet Take 400 mg by mouth 2 (two) times daily.   Yes Historical Provider, MD  hydrochlorothiazide (HYDRODIURIL) 12.5 MG tablet Take 1 tablet (12.5 mg total) by mouth daily. 07/14/15  Yes Lorie Phenix, MD  lisinopril (PRINIVIL,ZESTRIL) 10 MG tablet Take 1 tablet (10 mg total) by mouth daily. 08/05/15  Yes Lorie Phenix, MD  Magnesium 250 MG TABS Take by mouth. Reported on 08/05/2015   Yes Historical Provider, MD  Multiple Vitamins-Minerals (VISION-VITE PRESERVE PO) Take by mouth.   Yes Historical Provider, MD  Omega-3 Fatty Acids (FISH OIL) 1000 MG CAPS Take 2 capsules by mouth daily.   Yes Historical Provider, MD  ondansetron (ZOFRAN-ODT) 4 MG disintegrating tablet DISSOLVE ONE TABLET IN MOUTH EVERY 8 HOURS AS NEEDED FOR NAUSEA AND VOMITING 08/04/15  Yes Lorie Phenix, MD  pantoprazole (PROTONIX) 20 MG tablet Take 1 tablet (20 mg total) by mouth daily. 07/13/15 07/12/16 Yes Jennye Moccasin, MD  dexlansoprazole (DEXILANT) 60 MG capsule Take 1  capsule (60 mg total) by mouth daily. Patient not taking: Reported on 09/18/2015 09/08/15   [name], MD    Allergies as of 08/26/2015  . (No Known Allergies)    Family History  Problem Relation Age of Onset  . Dementia Mother   . COPD Father   . Breast cancer Sister 39  . Aortic aneurysm Mother     Social History   Social History  . Marital Status: Married    Spouse Name: Ondine Gemme  . Number of Children: 0  . Years of Education: 13   Occupational History  . Customer Service     [employer]   Social History Main Topics  . Smoking status: Never Smoker   . Smokeless tobacco: Never Used  . Alcohol Use: No  . Drug Use: No  . Sexual Activity: Not on file   Other Topics Concern  . Not on file   Social History Narrative    Review of Systems: See HPI, otherwise negative ROS  Physical Exam: BP 131/86 mmHg  Pulse 77  Temp(Src) 98.1 F (36.7 C) (Temporal)  Resp 18  Ht  (1.6 m)  Wt 275 lb (124.739 kg)  BMI 48.73 kg/m2  SpO2 98% General:   Alert,  pleasant and cooperative in NAD Head:  Normocephalic and atraumatic. Neck:  Supple; no masses or thyromegaly. Lungs:  Clear throughout to auscultation.    Heart:  Regular rate and rhythm. Abdomen:  Soft, nontender and  nondistended. Normal bowel sounds, without guarding, and without rebound.   Neurologic:  Alert and  oriented x4;  grossly normal neurologically.  Impression/Plan: Delrae SawyersRose Marie Baldwin is here for an endoscopy to be performed for dysphagia and nausea  Risks, benefits, limitations, and alternatives regarding  endoscopy have been reviewed with the patient.  Questions have been answered.  All parties agreeable.   Darlina Rumpfaren Audrinna Sherman, MD  09/18/2015, 8:43 AM

## 2015-09-18 NOTE — Progress Notes (Signed)
Subjective:    Patient ID: Mandy Baldwin, female    DOB: [birth year], 55 y.o.   MRN: [id number]  HPI Comments: Pt was seen on 08/05/2015 and was started on Lisinopril 10 mg. BP at LOV was 152/102. Pt is requesting to start Lisinopril-HCTZ combination tablet.  Hypertension This is a chronic problem. The problem has been gradually improving since onset. The problem is controlled. Associated symptoms include chest pain (acid reflux). Pertinent negatives include no anxiety, blurred vision, headaches, malaise/fatigue, neck pain, orthopnea, palpitations, peripheral edema, shortness of breath or sweats. Risk factors for coronary artery disease include post-menopausal state and obesity. Treatments tried: Currently taking HCTZ 12.5 mg and Lisinopril 10 mg. The current treatment provides significant improvement. There are no compliance problems.   Pt is checking BP at home, and the results have been in the 110's/70's.    Also had EGD nd esophagus dilatation earlier today and would like to review results here today.  Here with her husband today.     Review of Systems  Constitutional: Negative for malaise/fatigue.  Eyes: Negative for blurred vision.  Respiratory: Negative for shortness of breath.   Cardiovascular: Positive for chest pain (acid reflux). Negative for palpitations and orthopnea.  Musculoskeletal: Negative for neck pain.  Neurological: Negative for headaches.   BP 108/76 mmHg  Pulse 96  Temp(Src) 97.9 F (36.6 C) (Oral)  Resp 16  Ht 5\' 3"  (1.6 m)  Wt 278 lb (126.1 kg)  BMI 49.26 kg/m2   Patient Active Problem List   Diagnosis Date Noted  . Swallowing difficulty   . Heartburn   . Stricture and stenosis of esophagus   . Gastritis   . Mass of right breast on mammogram 07/16/2015  . GERD (gastroesophageal reflux disease) 07/14/2015  . Hypertension 07/14/2015  . Internal hemorrhoid, bleeding 06/27/2015  . 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 Avitaminosis D  02/07/2015  . Body mass index of 60 or higher (HCC) 02/07/2015  . Gravida 0 02/07/2015  . Abnormal glucose 02/07/2015  . Morbid obesity (HCC) 12/14/2014  . Irregular menses 12/14/2014  . Shingles 12/14/2014  . Vitamin D deficiency 12/14/2014  . Ocular migraine 12/14/2014  . Knee pain, right 12/14/2014   Past Medical History  Diagnosis Date  . TMJ (dislocation of temporomandibular joint)   . GERD (gastroesophageal reflux disease)   . Hypertension   . Hyperlipidemia   . Osteoarthritis of both knees   . [place] sickness     [place]   Current Outpatient Prescriptions on File Prior to Visit  Medication Sig  . cholecalciferol (VITAMIN D) 1000 UNITS tablet Take 2,000 Units by mouth daily.  Marland Kitchen. etodolac (LODINE) 400 MG tablet Take 400 mg by mouth 2 (two) times daily.  . hydrochlorothiazide (HYDRODIURIL) 12.5 MG tablet Take 1 tablet (12.5 mg total) by mouth daily.  Marland Kitchen. lisinopril (PRINIVIL,ZESTRIL) 10 MG tablet Take 1 tablet (10 mg total) by mouth daily.  . Multiple Vitamins-Minerals (VISION-VITE PRESERVE PO) Take by mouth.  . ondansetron (ZOFRAN-ODT) 4 MG disintegrating tablet DISSOLVE ONE TABLET IN MOUTH EVERY 8 HOURS AS NEEDED FOR NAUSEA AND VOMITING  . pantoprazole (PROTONIX) 20 MG tablet Take 1 tablet (20 mg total) by mouth daily.  . Cinnamon 500 MG capsule Take 500 mg by mouth daily. Reported on 09/18/2015  . Magnesium 250 MG TABS Take by mouth. Reported on 09/18/2015  . Omega-3 Fatty Acids (FISH OIL) 1000 MG CAPS Take 2 capsules by mouth daily. Reported on 09/18/2015   No current  facility-administered medications on file prior to visit.   Allergies  Allergen Reactions  . Tape Rash    Some bandaids cause rash   Past Surgical History  Procedure Laterality Date  . Laparoscopic ovarian    . Colonoscopy    . Esophagogastroduodenoscopy (egd) with propofol N/A 09/18/2015    Procedure: ESOPHAGOGASTRODUODENOSCOPY (EGD) WITH PROPOFOL with ballon dilatation;  Surgeon: Mandy Minium, MD;   [facility];  Service: Endoscopy;  Laterality: N/A;   Social History   Social History  . Marital Status: Married    Spouse Name: Mandy Baldwin  . Number of Children: 0  . Years of Education: 13   Occupational History  . Customer Service     [employer]   Social History Main Topics  . Smoking status: Never Smoker   . Smokeless tobacco: Never Used  . Alcohol Use: No  . Drug Use: No  . Sexual Activity: Not on file   Other Topics Concern  . Not on file   Social History Narrative   Family History  Problem Relation Age of Onset  . Dementia Mother   . COPD Father   . Breast cancer Sister 63  . Aortic aneurysm Mother        Objective:   Physical Exam  Constitutional: She appears well-developed and well-nourished.  Cardiovascular: Normal rate, regular rhythm and normal heart sounds.   Pulmonary/Chest: Effort normal and breath sounds normal. No respiratory distress.  Psychiatric: She has a normal mood and affect. Her behavior is normal.   BP 108/76 mmHg  Pulse 96  Temp(Src) 97.9 F (36.6 C) (Oral)  Resp 16  Ht  (1.6 m)  Wt 278 lb (126.1 kg)  BMI 49.26 kg/m2     Assessment & Plan:  1. Essential hypertension Stable. Will add Lisinopril-HCTZ combination tablet as below. Continue to monitor. Patient instructed to call back if condition worsens or does not improve.    - lisinopril-hydrochlorothiazide (PRINZIDE,ZESTORETIC) 10-12.5 MG tablet; Take 1 tablet by mouth daily.  Dispense: 90 tablet; Refill: 3 - Comprehensive metabolic panel  2. Morbid obesity, unspecified obesity type (HCC) Not to goal. Pt agrees to start lifestyle changes. Weight is large risk factor for her over all health. Stressed importance of lifestyle changes.    3. BMI 45.0-49.9, adult (HCC) Encouraged pt to work in diet and exercise.  4. Gastroesophageal reflux disease without esophagitis Pt had EGD performed this morning by Dr. Servando Baldwin. Discussed results with pt. Will need to  follow up with GI.  Continue medication. Awaiting biopsy results.    Patient seen and examined by Mandy Grosser, MD, and note scribed by Mandy Baldwin, CMA.  I have reviewed the document for accuracy and completeness and I agree with above. Mandy Grosser, MD   Lorie Phenix, MD

## 2015-09-18 NOTE — Anesthesia Postprocedure Evaluation (Signed)
Anesthesia Post Note  Patient: Mandy Baldwin  Procedure(s) Performed: Procedure(s) (LRB): ESOPHAGOGASTRODUODENOSCOPY (EGD) WITH PROPOFOL with ballon dilatation (N/A)  Patient location during evaluation: PACU Anesthesia Type: MAC Level of consciousness: awake and alert Pain management: pain level controlled Vital Signs Assessment: post-procedure vital signs reviewed and stable Respiratory status: spontaneous breathing and respiratory function stable Cardiovascular status: stable Anesthetic complications: no    Verner Cholunkle, III,  Marsena Taff D

## 2015-09-18 NOTE — Op Note (Signed)
[facility] Gastroenterology Patient Name: Mandy Baldwin Procedure Date: 09/18/2015 9:13 AM MRN: [id number] Account #: [id number] Date of Birth: 1960-08-25 Admit Type: Outpatient Age: 55 Room: [facility] OR ROOM 01 Gender: Female Note Status: Finalized Procedure:            Upper GI endoscopy Indications:          Dysphagia, Heartburn, Nausea Providers:            [name], MD Referring MD:         Leo Grosser, MD (Referring MD) Medicines:            Propofol per Anesthesia Complications:        No immediate complications. Procedure:            Pre-Anesthesia Assessment:                       - Prior to the procedure, a History and Physical was                        performed, and patient medications and allergies were                        reviewed. The patient's tolerance of previous                        anesthesia was also reviewed. The risks and benefits of                        the procedure and the sedation options and risks were                        discussed with the patient. All questions were                        answered, and informed consent was obtained. Prior                        Anticoagulants: The patient has taken no previous                        anticoagulant or antiplatelet agents. ASA Grade                        Assessment: II - A patient with mild systemic disease.                        After reviewing the risks and benefits, the patient was                        deemed in satisfactory condition to undergo the                        procedure.                       After obtaining informed consent, the endoscope was                        passed under direct vision. Throughout the procedure,  the patient's blood pressure, pulse, and oxygen                        saturations were monitored continuously. The Olympus                        GIF H180J endoscope (S#: [id number]) was introduced        through the mouth, and advanced to the second part of                        duodenum. The upper GI endoscopy was accomplished                        without difficulty. The patient tolerated the procedure                        well. Findings:      The Z-line was irregular and was found in the lower third of the       esophagus.      One mild benign-appearing, intrinsic stenosis was found at the       gastroesophageal junction. And was traversed. A TTS dilator was passed       through the scope. Dilation with a 15-16.5-18 mm balloon dilator was       performed to 18 mm.      Localized mild inflammation characterized by erythema was found in the       gastric antrum. Biopsies were taken with a cold forceps for histology.      The examined duodenum was normal.      Two biopsies were obtained with cold forceps for histology in a targeted       manner in the mid esophagus. Impression:           - Z-line irregular, in the lower third of the esophagus.                       - Benign-appearing esophageal stenosis. Dilated.                       - Gastritis. Biopsied.                       - Normal examined duodenum.                       - Two biopsies were obtained in the mid esophagus. Recommendation:       - Await pathology results. Procedure Code(s):    --- Professional ---                       [id number], Esophagogastroduodenoscopy, flexible, transoral;                        with transendoscopic balloon dilation of esophagus                        (less than 30 mm diameter)                       43239, Esophagogastroduodenoscopy, flexible, transoral;  with biopsy, single or multiple Diagnosis Code(s):    --- Professional ---                       R13.10, Dysphagia, unspecified                       R12, Heartburn                       K22.8, Other specified diseases of esophagus                       K22.2, Esophageal obstruction                       K29.70,  Gastritis, unspecified, without bleeding CPT copyright 2016 American Medical Association. All rights reserved. The codes documented in this report are preliminary and upon coder review may  be revised to meet current compliance requirements. [name], MD 09/18/2015 9:29:32 AM This report has been signed electronically. Number of Addenda: 0 Note Initiated On: 09/18/2015 9:13 AM Total Procedure Duration: 0 hours 5 minutes 57 seconds       [facility]

## 2015-09-18 NOTE — Transfer of Care (Signed)
Immediate Anesthesia Transfer of Care Note  Patient: Mandy Baldwin  Procedure(s) Performed: Procedure(s): ESOPHAGOGASTRODUODENOSCOPY (EGD) WITH PROPOFOL with ballon dilatation (N/A)  Patient Location: PACU  Anesthesia Type: MAC  Level of Consciousness: awake, alert  and patient cooperative  Airway and Oxygen Therapy: Patient Spontanous Breathing and Patient connected to supplemental oxygen  Post-op Assessment: Post-op Vital signs reviewed, Patient's Cardiovascular Status Stable, Respiratory Function Stable, Patent Airway and No signs of Nausea or vomiting  Post-op Vital Signs: Reviewed and stable  Complications: No apparent anesthesia complications

## 2015-09-18 NOTE — Anesthesia Procedure Notes (Signed)
Procedure Name: MAC Performed by: Alannah Averhart Pre-anesthesia Checklist: Patient identified, Emergency Drugs available, Suction available, Timeout performed and Patient being monitored Patient Re-evaluated:Patient Re-evaluated prior to inductionOxygen Delivery Method: Nasal cannula Placement Confirmation: positive ETCO2       

## 2015-09-19 ENCOUNTER — Ambulatory Visit
Admission: RE | Admit: 2015-09-19 | Discharge: 2015-09-19 | Disposition: A | Discharge status: Home / Self Care | Payer: No Typology Code available for payment source | Source: Ambulatory Visit | Attending: Family Medicine | Admitting: Family Medicine

## 2015-09-19 ENCOUNTER — Other Ambulatory Visit: Payer: Self-pay | Admitting: Family Medicine

## 2015-09-19 ENCOUNTER — Telehealth: Payer: Self-pay

## 2015-09-19 DIAGNOSIS — N63 Unspecified lump in breast: Secondary | ICD-10-CM | POA: Insufficient documentation

## 2015-09-19 DIAGNOSIS — N6310 Unspecified lump in the right breast, unspecified quadrant: Secondary | ICD-10-CM

## 2015-09-19 DIAGNOSIS — N6459 Other signs and symptoms in breast: Secondary | ICD-10-CM

## 2015-09-19 LAB — COMPREHENSIVE METABOLIC PANEL
ALBUMIN: 4.4 g/dL (ref 3.5–5.5)
ALK PHOS: 72 IU/L (ref 39–117)
ALT: 14 IU/L (ref 0–32)
AST: 13 IU/L (ref 0–40)
Albumin/Globulin Ratio: 1.5 (ref 1.2–2.2)
BILIRUBIN TOTAL: 0.4 mg/dL (ref 0.0–1.2)
BUN / CREAT RATIO: 18 (ref 9–23)
BUN: 9 mg/dL (ref 6–24)
CO2: 23 mmol/L (ref 18–29)
CREATININE: 0.51 mg/dL — AB (ref 0.57–1.00)
Calcium: 9.5 mg/dL (ref 8.7–10.2)
Chloride: 101 mmol/L (ref 96–106)
GFR calc non Af Amer: 111 mL/min/{1.73_m2} (ref >59)
GFR, EST AFRICAN AMERICAN: 128 mL/min/{1.73_m2} (ref >59)
GLOBULIN, TOTAL: 3 g/dL (ref 1.5–4.5)
GLUCOSE: 95 mg/dL (ref 65–99)
Potassium: 4.1 mmol/L (ref 3.5–5.2)
SODIUM: 142 mmol/L (ref 134–144)
TOTAL PROTEIN: 7.4 g/dL (ref 6.0–8.5)

## 2015-09-19 NOTE — Telephone Encounter (Signed)
Pt advised.   Thanks,   -Dovber Ernest  

## 2015-09-19 NOTE — Telephone Encounter (Signed)
-----   Message from Lorie PhenixNancy Maloney, MD sent at 09/19/2015 11:29 AM EDT ----- Labs stable. Please notify patient. Thanks.

## 2015-09-23 ENCOUNTER — Encounter: Payer: Self-pay | Admitting: Gastroenterology

## 2015-09-24 ENCOUNTER — Encounter: Payer: Self-pay | Admitting: Gastroenterology

## 2015-09-26 ENCOUNTER — Other Ambulatory Visit: Payer: Self-pay

## 2015-09-26 DIAGNOSIS — K2100 Gastro-esophageal reflux disease with esophagitis, without bleeding: Secondary | ICD-10-CM

## 2015-09-26 MED ORDER — PANTOPRAZOLE SODIUM 40 MG PO TBEC: 40.0000 mg | DELAYED_RELEASE_TABLET | Freq: Every day | ORAL | Status: DC

## 2015-10-07 ENCOUNTER — Telehealth: Payer: Self-pay | Admitting: Gastroenterology

## 2015-10-07 NOTE — Telephone Encounter (Signed)
Patient left a voice message that she would like to speak to you. No reason given.

## 2015-10-09 ENCOUNTER — Other Ambulatory Visit: Payer: Self-pay | Admitting: Family Medicine

## 2015-10-23 ENCOUNTER — Telehealth: Payer: Self-pay | Admitting: Emergency Medicine

## 2015-10-23 DIAGNOSIS — I10 Essential (primary) hypertension: Secondary | ICD-10-CM

## 2015-10-23 MED ORDER — TRIAMTERENE-HCTZ 37.5-25 MG PO TABS: 1.0000 | ORAL_TABLET | Freq: Every day | ORAL | Status: DC

## 2015-10-23 NOTE — Telephone Encounter (Signed)
Pt saw Dr. Jenne CampusMcQueen yesterday and was told that she needed to stop her Lisinopril-HCTZ because of the dysphagia and throat swelling. She would like to know what you would like her to do. Please advise.

## 2015-10-23 NOTE — Telephone Encounter (Signed)
Can switch to Maxzide.  Still has HCTZ but think he is concerned about Lisinopril. Please check with patient. If so,  Delete current medication and start Maxzide 37.5/25. Thanks.

## 2015-10-23 NOTE — Telephone Encounter (Signed)
Pt reports Dr. Jenne CampusMcQueen is concerned of the Ace inhibitor. Sent in Maxzide as below. Allene DillonEmily Drozdowski, CMA

## 2015-11-06 NOTE — Telephone Encounter (Signed)
Pt thought her EGD was a screening. Informed her EGD's are never screening. They are always diagnostic.

## 2015-11-10 ENCOUNTER — Telehealth: Payer: Self-pay

## 2015-11-10 ENCOUNTER — Ambulatory Visit (INDEPENDENT_AMBULATORY_CARE_PROVIDER_SITE_OTHER): Payer: No Typology Code available for payment source | Admitting: Family Medicine

## 2015-11-10 ENCOUNTER — Encounter: Payer: Self-pay | Admitting: Family Medicine

## 2015-11-10 VITALS — BP 124/84 | HR 66 | Temp 98.9°F | Resp 16 | Wt 278.4 lb

## 2015-11-10 DIAGNOSIS — R21 Rash and other nonspecific skin eruption: Secondary | ICD-10-CM

## 2015-11-10 DIAGNOSIS — W57XXXA Bitten or stung by nonvenomous insect and other nonvenomous arthropods, initial encounter: Secondary | ICD-10-CM

## 2015-11-10 DIAGNOSIS — T148 Other injury of unspecified body region: Secondary | ICD-10-CM

## 2015-11-10 MED ORDER — FLUCONAZOLE 150 MG PO TABS: 150.0000 mg | ORAL_TABLET | Freq: Once | ORAL | Status: DC

## 2015-11-10 MED ORDER — DOXYCYCLINE HYCLATE 100 MG PO TABS: 100.0000 mg | ORAL_TABLET | Freq: Two times a day (BID) | ORAL | Status: DC

## 2015-11-10 NOTE — Telephone Encounter (Signed)
Patient reports rash on right forearm times 1 week and rash on right lower side of back for 3 days. Patient reports itching on arm and denies pain.

## 2015-11-10 NOTE — Progress Notes (Signed)
Subjective:     Patient ID: Mandy Baldwin, female   DOB: 22-May-1961, 55 y.o.   MRN: [id number]  HPI  Chief Complaint  Patient presents with  . Rash    Patient comes in office today with complaints of rash on right arm that has been present since 11/08/15. Patient states that on Wednesday 11/05/15 she had allergy testing done and had recieved multiple sticks to her arm. Patient states that arm is now red, swollen and tight. She has  been taking otc hydrocortisone cream and Benadryl.    States there is a raised itchy area with central clearing in the same area of her right upper arm where she got allergy tested. Also has an itchy area on her right lower back. Denies specific tick exposure but states her husband works outside a lot. Has been using both oral and topical Benadryl with little improvement.   Review of Systems  Constitutional: Negative for fever and chills (no body aches).       Objective:   Physical Exam  Constitutional: She appears well-developed and well-nourished. No distress.  Skin:  Right upper arm with rectangular area of erythema coinciding with the allergy testing area. Has an urticarial appearing rash with central clearing. Right lower back with what appears to be insect bite with  inflammatory flare.       Assessment:    1. Rash and nonspecific skin eruption: will cover for erythema migrans - doxycycline (VIBRA-TABS) 100 MG tablet; Take 1 tablet (100 mg total) by mouth 2 (two) times daily.  Dispense: 14 tablet; Refill: 1 - fluconazole (DIFLUCAN) 150 MG tablet; Take 1 tablet (150 mg total) by mouth once.  Dispense: 1 tablet; Refill: 1  2. Insect bite:  - doxycycline (VIBRA-TABS) 100 MG tablet; Take 1 tablet (100 mg total) by mouth 2 (two) times daily.  Dispense: 14 tablet; Refill: 1    Plan:   Continue hydrocortisone cream for itching and oral Benadryl.

## 2015-11-10 NOTE — Patient Instructions (Signed)
Continue hydrocortisone cream for itching.

## 2015-11-10 NOTE — Telephone Encounter (Signed)
FYI patient being seen at 4:30PM

## 2015-11-17 ENCOUNTER — Telehealth: Payer: Self-pay | Admitting: Family Medicine

## 2015-11-17 ENCOUNTER — Other Ambulatory Visit: Payer: Self-pay | Admitting: Family Medicine

## 2015-11-17 DIAGNOSIS — R21 Rash and other nonspecific skin eruption: Secondary | ICD-10-CM

## 2015-11-17 MED ORDER — FLUCONAZOLE 150 MG PO TABS: 150.0000 mg | ORAL_TABLET | Freq: Once | ORAL | Status: DC

## 2015-11-17 NOTE — Telephone Encounter (Signed)
Pt already too doxycycline (VIBRA-TABS) 100 MG tablet the first week of her antibiotic and she is on her second week.  She stated she is feeling a burning sensation again and would like a refill of doxycycline (VIBRA-TABS) 100 MG tablet [facility] [address].

## 2015-11-17 NOTE — Telephone Encounter (Signed)
Spoke with pt she has refill for doxycycline but needs refill for fluconazole (DIFLUCAN) 150 MG tablet she only receive 1 pill she wants to make sure it's enough or she needs more pills.

## 2015-11-17 NOTE — Telephone Encounter (Signed)
A refill was included with the first prescription on 6/5.

## 2015-11-17 NOTE — Telephone Encounter (Signed)
done

## 2016-01-14 NOTE — Telephone Encounter (Signed)
error 

## 2016-02-20 ENCOUNTER — Encounter: Payer: PRIVATE HEALTH INSURANCE | Admitting: Physician Assistant

## 2016-03-12 ENCOUNTER — Ambulatory Visit (INDEPENDENT_AMBULATORY_CARE_PROVIDER_SITE_OTHER): Payer: No Typology Code available for payment source | Admitting: Physician Assistant

## 2016-03-12 ENCOUNTER — Encounter: Payer: Self-pay | Admitting: Physician Assistant

## 2016-03-12 VITALS — BP 118/70 | HR 80 | Temp 98.1°F | Resp 16 | Ht 63.0 in | Wt 286.0 lb

## 2016-03-12 DIAGNOSIS — Z0000 Encounter for general adult medical examination without abnormal findings: Secondary | ICD-10-CM

## 2016-03-12 DIAGNOSIS — Z1159 Encounter for screening for other viral diseases: Secondary | ICD-10-CM | POA: Diagnosis not present

## 2016-03-12 DIAGNOSIS — Z1231 Encounter for screening mammogram for malignant neoplasm of breast: Secondary | ICD-10-CM

## 2016-03-12 DIAGNOSIS — Z1239 Encounter for other screening for malignant neoplasm of breast: Secondary | ICD-10-CM

## 2016-03-12 DIAGNOSIS — I10 Essential (primary) hypertension: Secondary | ICD-10-CM

## 2016-03-12 LAB — POCT URINALYSIS DIPSTICK
Blood, UA: NEGATIVE
GLUCOSE UA: NEGATIVE
Ketones, UA: NEGATIVE
LEUKOCYTES UA: NEGATIVE
Nitrite, UA: NEGATIVE
PROTEIN UA: NEGATIVE
Spec Grav, UA: 1.01
UROBILINOGEN UA: 0.2
pH, UA: 6

## 2016-03-12 NOTE — Progress Notes (Signed)
Patient: Mandy Baldwin, Female    DOB: 1960/07/02, 55 y.o.   MRN: [id number] Visit Date: 03/12/2016  Today's Provider: Mar Daring, PA-C   Chief Complaint  Patient presents with  . Annual Exam   Subjective:    Annual physical exam Mandy Baldwin is a 55 y.o. female who presents today for health maintenance and complete physical. She feels well. She reports exercising 3-4 days a week. She reports she is sleeping fairly well. 02/07/15 CPE 01/18/14 Pap-neg; HPV-neg 09/19/15 Mammogram-BI-RADS 2 04/12/14 colonoscopy-polyps, recheck in 5 yrs 07/14/15 EKG -----------------------------------------------------------------   Review of Systems  Constitutional: Negative.   HENT: Negative.   Eyes: Positive for discharge (allergies).  Respiratory: Negative.   Cardiovascular: Negative.   Gastrointestinal: Negative.   Endocrine: Negative.   Genitourinary: Negative.   Musculoskeletal: Positive for myalgias and neck pain.  Skin: Negative.   Allergic/Immunologic: Positive for environmental allergies. Negative for food allergies and immunocompromised state.  Neurological: Negative.   Hematological: Negative.   Psychiatric/Behavioral: Negative.   Neck pain and shoulder pain is chronic  Social History      She  reports that she has never smoked. She has never used smokeless tobacco. She reports that she does not drink alcohol or use drugs.       Social History   Social History  . Marital status: Married    Spouse name: Danisha Brassfield  . Number of children: 0  . Years of education: 17   Occupational History  . Customer Service     [employer]   Social History Main Topics  . Smoking status: Never Smoker  . Smokeless tobacco: Never Used  . Alcohol use No  . Drug use: No  . Sexual activity: Not Asked   Other Topics Concern  . None   Social History Narrative  . None    Past Medical History:  Diagnosis Date  . GERD (gastroesophageal reflux disease)    . Hyperlipidemia   . Hypertension   . Motion sickness    [place]  . Osteoarthritis of both knees   . TMJ (dislocation of temporomandibular joint)      Patient Active Problem List   Diagnosis Date Noted  . Swallowing difficulty   . Heartburn   . Stricture and stenosis of esophagus   . Gastritis   . Mass of right breast on mammogram 07/16/2015  . GERD (gastroesophageal reflux disease) 07/14/2015  . Hypertension 07/14/2015  . Internal hemorrhoid, bleeding 06/27/2015  . 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 Avitaminosis D 02/07/2015  . Body mass index of 60 or higher 02/07/2015  . Gravida 0 02/07/2015  . Abnormal glucose 02/07/2015  . Morbid obesity ([place]) 12/14/2014  . Irregular menses 12/14/2014  . Shingles 12/14/2014  . Vitamin D deficiency 12/14/2014  . Ocular migraine 12/14/2014  . Knee pain, right 12/14/2014    Past Surgical History:  Procedure Laterality Date  . COLONOSCOPY    . ESOPHAGOGASTRODUODENOSCOPY (EGD) WITH PROPOFOL N/A 09/18/2015   Procedure: ESOPHAGOGASTRODUODENOSCOPY (EGD) WITH PROPOFOL with ballon dilatation;  Surgeon: Lucilla Lame, MD;  Location: [place];  Service: Endoscopy;  Laterality: N/A;  . LAPAROSCOPIC OVARIAN      Family History        Family Status  Relation Status  . Mother Deceased  . Father Deceased  . Sister Deceased  . Brother Alive  . Sister Alive  . Sister Alive  . Brother Alive  Her family history includes Aortic aneurysm in her mother; Breast cancer (age of onset: 33) in her sister; COPD in her brother and father; Dementia in her mother.    Allergies  Allergen Reactions  . Tape Rash    Some bandaids cause rash    Current Meds  Medication Sig  . cholecalciferol (VITAMIN D) 1000 UNITS tablet Take 2,000 Units by mouth daily.  Marland Kitchen etodolac (LODINE) 400 MG tablet Take 400 mg by mouth 2 (two) times daily.  . fluticasone (FLONASE) 50 MCG/ACT nasal spray Place 2 sprays into both nostrils  daily.  . Multiple Vitamins-Minerals (VISION-VITE PRESERVE PO) Take by mouth.  . pantoprazole (PROTONIX) 40 MG tablet Take 1 tablet (40 mg total) by mouth daily.  Marland Kitchen triamterene-hydrochlorothiazide (MAXZIDE-25) 37.5-25 MG tablet Take 1 tablet by mouth daily.  . [DISCONTINUED] fluconazole (DIFLUCAN) 150 MG tablet Take 1 tablet (150 mg total) by mouth once.  . [DISCONTINUED] ondansetron (ZOFRAN-ODT) 4 MG disintegrating tablet DISSOLVE ONE TABLET IN MOUTH EVERY 8 HOURS AS NEEDED FOR NAUSEA AND VOMITING    Patient Care Team: Margarita Rana, MD as PCP - General (Family Medicine)     Objective:   Vitals: BP 118/70 (BP Location: Left Arm, Patient Position: Sitting, Cuff Size: Large)   Pulse 80   Temp 98.1 F (36.7 C) (Oral)   Resp 16   Ht '5\' 3"'$  (1.6 m)   Wt 286 lb (129.7 kg)   BMI 50.66 kg/m    Physical Exam  Constitutional: She is oriented to person, place, and time. She appears well-developed and well-nourished. No distress.  HENT:  Head: Normocephalic and atraumatic.  Right Ear: Hearing, tympanic membrane, external ear and ear canal normal.  Left Ear: Hearing, tympanic membrane, external ear and ear canal normal.  Nose: Nose normal.  Mouth/Throat: Uvula is midline, oropharynx is clear and moist and mucous membranes are normal. No oropharyngeal exudate.  Eyes: Conjunctivae and EOM are normal. Pupils are equal, round, and reactive to light. Right eye exhibits no discharge. Left eye exhibits no discharge. No scleral icterus.  Neck: Normal range of motion. Neck supple. No JVD present. Carotid bruit is not present. No tracheal deviation present. No thyromegaly present.  Cardiovascular: Normal rate, regular rhythm, normal heart sounds and intact distal pulses.  Exam reveals no gallop and no friction rub.   No murmur heard. Pulmonary/Chest: Effort normal and breath sounds normal. No respiratory distress. She has no wheezes. She has no rales. She exhibits no tenderness. Right breast exhibits no  inverted nipple, no mass, no nipple discharge, no skin change and no tenderness. Left breast exhibits no inverted nipple, no mass, no nipple discharge, no skin change and no tenderness. Breasts are symmetrical.  Abdominal: Soft. Bowel sounds are normal. She exhibits no distension, no abdominal bruit, no pulsatile midline mass and no mass. There is no tenderness. There is no rebound and no guarding.  Musculoskeletal: Normal range of motion. She exhibits no edema or tenderness.  Lymphadenopathy:    She has no cervical adenopathy.  Neurological: She is alert and oriented to person, place, and time.  Skin: Skin is warm and dry. No rash noted. She is not diaphoretic.  Psychiatric: She has a normal mood and affect. Her behavior is normal. Judgment and thought content normal.  Vitals reviewed.  Depression Screen PHQ 2/9 Scores 03/12/2016 02/07/2015  PHQ - 2 Score 0 0      Assessment & Plan:     Routine Health Maintenance and Physical Exam  Exercise Activities and  Dietary recommendations Goals    . Reduce portion size       Immunization History  Administered Date(s) Administered  . MMR 12/24/1998  . Td 03/04/2005    Health Maintenance  Topic Date Due  . Hepatitis C Screening  08-Jun-1960  . INFLUENZA VACCINE  09/04/2016 (Originally 01/06/2016)  . TETANUS/TDAP  03/12/2017 (Originally 03/05/2015)  . PAP SMEAR  01/18/2017  . MAMMOGRAM  02/17/2017  . COLONOSCOPY  04/12/2024  . HIV Screening  Excluded      Discussed health benefits of physical activity, and encouraged her to engage in regular exercise appropriate for her age and condition.   1. Annual physical exam UA normal. Normal physical exam today. Will check labs as below and f/u pending lab results. If labs are stable and WNL she will not need to have these rechecked for one year at her next annual physical exam. She is to call the office in the meantime if she has any acute issue, questions or concerns. - POCT urinalysis  dipstick - CBC with Differential - Comprehensive metabolic panel - TSH  2. Essential hypertension Stable. Continue current medical treatment plan. Will check labs as below and f/u pending results. - CBC with Differential - Comprehensive metabolic panel - Lipid panel  3. Morbid obesity ([place]) Will check labs as below and f/u pending results. - Hemoglobin A1c - Lipid panel  4. Need for hepatitis C screening test - Hepatitis C antibody screen  5. Breast cancer screening Breast exam today was normal. There is family history of breast cancer in her sister. She does perform regular self breast exams. Mammogram was ordered as below. Information for [facility] was given to patient so she may schedule her mammogram at her convenience. - Mammogram Digital Screening; Future  --------------------------------------------------------------------    Mar Daring, PA-C  [facility]

## 2016-03-12 NOTE — Patient Instructions (Signed)
Health Maintenance, Female Adopting a healthy lifestyle and getting preventive care can go a long way to promote health and wellness. Talk with your health care provider about what schedule of regular examinations is right for you. This is a good chance for you to check in with your provider about disease prevention and staying healthy. In between checkups, there are plenty of things you can do on your own. Experts have done a lot of research about which lifestyle changes and preventive measures are most likely to keep you healthy. Ask your health care provider for more information. WEIGHT AND DIET  Eat a healthy diet  Be sure to include plenty of vegetables, fruits, low-fat dairy products, and lean protein.  Do not eat a lot of foods high in solid fats, added sugars, or salt.  Get regular exercise. This is one of the most important things you can do for your health.  Most adults should exercise for at least 150 minutes each week. The exercise should increase your heart rate and make you sweat (moderate-intensity exercise).  Most adults should also do strengthening exercises at least twice a week. This is in addition to the moderate-intensity exercise.  Maintain a healthy weight  Body mass index (BMI) is a measurement that can be used to identify possible weight problems. It estimates body fat based on height and weight. Your health care provider can help determine your BMI and help you achieve or maintain a healthy weight.  For females 28 years of age and older:   A BMI below 18.5 is considered underweight.  A BMI of 18.5 to 24.9 is normal.  A BMI of 25 to 29.9 is considered overweight.  A BMI of 30 and above is considered obese.  Watch levels of cholesterol and blood lipids  You should start having your blood tested for lipids and cholesterol at 53 years of age, then have this test every 5 years.  You may need to have your cholesterol levels checked more often if:  Your lipid  or cholesterol levels are high.  You are older than 53 years of age.  You are at high risk for heart disease.  CANCER SCREENING   Lung Cancer  Lung cancer screening is recommended for adults 75-66 years old who are at high risk for lung cancer because of a history of smoking.  A yearly low-dose CT scan of the lungs is recommended for people who:  Currently smoke.  Have quit within the past 15 years.  Have at least a 30-pack-year history of smoking. A pack year is smoking an average of one pack of cigarettes a day for 1 year.  Yearly screening should continue until it has been 15 years since you quit.  Yearly screening should stop if you develop a health problem that would prevent you from having lung cancer treatment.  Breast Cancer  Practice breast self-awareness. This means understanding how your breasts normally appear and feel.  It also means doing regular breast self-exams. Let your health care provider know about any changes, no matter how small.  If you are in your 20s or 30s, you should have a clinical breast exam (CBE) by a health care provider every 1-3 years as part of a regular health exam.  If you are 25 or older, have a CBE every year. Also consider having a breast X-ray (mammogram) every year.  If you have a family history of breast cancer, talk to your health care provider about genetic screening.  If you  are at high risk for breast cancer, talk to your health care provider about having an MRI and a mammogram every year.  Breast cancer gene (BRCA) assessment is recommended for women who have family members with BRCA-related cancers. BRCA-related cancers include:  Breast.  Ovarian.  Tubal.  Peritoneal cancers.  Results of the assessment will determine the need for genetic counseling and BRCA1 and BRCA2 testing. Cervical Cancer Your health care provider may recommend that you be screened regularly for cancer of the pelvic organs (ovaries, uterus, and  vagina). This screening involves a pelvic examination, including checking for microscopic changes to the surface of your cervix (Pap test). You may be encouraged to have this screening done every 3 years, beginning at age 21.  For women ages 30-65, health care providers may recommend pelvic exams and Pap testing every 3 years, or they may recommend the Pap and pelvic exam, combined with testing for human papilloma virus (HPV), every 5 years. Some types of HPV increase your risk of cervical cancer. Testing for HPV may also be done on women of any age with unclear Pap test results.  Other health care providers may not recommend any screening for nonpregnant women who are considered low risk for pelvic cancer and who do not have symptoms. Ask your health care provider if a screening pelvic exam is right for you.  If you have had past treatment for cervical cancer or a condition that could lead to cancer, you need Pap tests and screening for cancer for at least 20 years after your treatment. If Pap tests have been discontinued, your risk factors (such as having a new sexual partner) need to be reassessed to determine if screening should resume. Some women have medical problems that increase the chance of getting cervical cancer. In these cases, your health care provider may recommend more frequent screening and Pap tests. Colorectal Cancer  This type of cancer can be detected and often prevented.  Routine colorectal cancer screening usually begins at 53 years of age and continues through 53 years of age.  Your health care provider may recommend screening at an earlier age if you have risk factors for colon cancer.  Your health care provider may also recommend using home test kits to check for hidden blood in the stool.  A small camera at the end of a tube can be used to examine your colon directly (sigmoidoscopy or colonoscopy). This is done to check for the earliest forms of colorectal  cancer.  Routine screening usually begins at age 50.  Direct examination of the colon should be repeated every 5-10 years through 53 years of age. However, you may need to be screened more often if early forms of precancerous polyps or small growths are found. Skin Cancer  Check your skin from head to toe regularly.  Tell your health care provider about any new moles or changes in moles, especially if there is a change in a mole's shape or color.  Also tell your health care provider if you have a mole that is larger than the size of a pencil eraser.  Always use sunscreen. Apply sunscreen liberally and repeatedly throughout the day.  Protect yourself by wearing long sleeves, pants, a wide-brimmed hat, and sunglasses whenever you are outside. HEART DISEASE, DIABETES, AND HIGH BLOOD PRESSURE   High blood pressure causes heart disease and increases the risk of stroke. High blood pressure is more likely to develop in:  People who have blood pressure in the high end   of the normal range (130-139/85-89 mm Hg).  People who are overweight or obese.  People who are African American.  If you are 38-23 years of age, have your blood pressure checked every 3-5 years. If you are 61 years of age or older, have your blood pressure checked every year. You should have your blood pressure measured twice--once when you are at a hospital or clinic, and once when you are not at a hospital or clinic. Record the average of the two measurements. To check your blood pressure when you are not at a hospital or clinic, you can use:  An automated blood pressure machine at a pharmacy.  A home blood pressure monitor.  If you are between 45 years and 39 years old, ask your health care provider if you should take aspirin to prevent strokes.  Have regular diabetes screenings. This involves taking a blood sample to check your fasting blood sugar level.  If you are at a normal weight and have a low risk for diabetes,  have this test once every three years after 53 years of age.  If you are overweight and have a high risk for diabetes, consider being tested at a younger age or more often. PREVENTING INFECTION  Hepatitis B  If you have a higher risk for hepatitis B, you should be screened for this virus. You are considered at high risk for hepatitis B if:  You were born in a country where hepatitis B is common. Ask your health care provider which countries are considered high risk.  Your parents were born in a high-risk country, and you have not been immunized against hepatitis B (hepatitis B vaccine).  You have HIV or AIDS.  You use needles to inject street drugs.  You live with someone who has hepatitis B.  You have had sex with someone who has hepatitis B.  You get hemodialysis treatment.  You take certain medicines for conditions, including cancer, organ transplantation, and autoimmune conditions. Hepatitis C  Blood testing is recommended for:  Everyone born from [birth year] through 1963.  Anyone with known risk factors for hepatitis C. Sexually transmitted infections (STIs)  You should be screened for sexually transmitted infections (STIs) including gonorrhea and chlamydia if:  You are sexually active and are younger than 53 years of age.  You are older than 53 years of age and your health care provider tells you that you are at risk for this type of infection.  Your sexual activity has changed since you were last screened and you are at an increased risk for chlamydia or gonorrhea. Ask your health care provider if you are at risk.  If you do not have HIV, but are at risk, it may be recommended that you take a prescription medicine daily to prevent HIV infection. This is called pre-exposure prophylaxis (PrEP). You are considered at risk if:  You are sexually active and do not regularly use condoms or know the HIV status of your partner(s).  You take drugs by injection.  You are sexually  active with a partner who has HIV. Talk with your health care provider about whether you are at high risk of being infected with HIV. If you choose to begin PrEP, you should first be tested for HIV. You should then be tested every 3 months for as long as you are taking PrEP.  PREGNANCY   If you are premenopausal and you may become pregnant, ask your health care provider about preconception counseling.  If you may  become pregnant, take 400 to 800 micrograms (mcg) of folic acid every day.  If you want to prevent pregnancy, talk to your health care provider about birth control (contraception). OSTEOPOROSIS AND MENOPAUSE   Osteoporosis is a disease in which the bones lose minerals and strength with aging. This can result in serious bone fractures. Your risk for osteoporosis can be identified using a bone density scan.  If you are 61 years of age or older, or if you are at risk for osteoporosis and fractures, ask your health care provider if you should be screened.  Ask your health care provider whether you should take a calcium or vitamin D supplement to lower your risk for osteoporosis.  Menopause may have certain physical symptoms and risks.  Hormone replacement therapy may reduce some of these symptoms and risks. Talk to your health care provider about whether hormone replacement therapy is right for you.  HOME CARE INSTRUCTIONS   Schedule regular health, dental, and eye exams.  Stay current with your immunizations.   Do not use any tobacco products including cigarettes, chewing tobacco, or electronic cigarettes.  If you are pregnant, do not drink alcohol.  If you are breastfeeding, limit how much and how often you drink alcohol.  Limit alcohol intake to no more than 1 drink per day for nonpregnant women. One drink equals 12 ounces of beer, 5 ounces of wine, or 1 ounces of hard liquor.  Do not use street drugs.  Do not share needles.  Ask your health care provider for help if  you need support or information about quitting drugs.  Tell your health care provider if you often feel depressed.  Tell your health care provider if you have ever been abused or do not feel safe at home.   This information is not intended to replace advice given to you by your health care provider. Make sure you discuss any questions you have with your health care provider.   Document Released: 12/07/2010 Document Revised: 06/14/2014 Document Reviewed: 04/25/2013 Elsevier Interactive Patient Education [employer].

## 2016-03-18 ENCOUNTER — Telehealth: Payer: Self-pay

## 2016-03-18 DIAGNOSIS — J0100 Acute maxillary sinusitis, unspecified: Secondary | ICD-10-CM

## 2016-03-18 MED ORDER — AZITHROMYCIN 250 MG PO TABS: ORAL_TABLET | ORAL | 0 refills | Status: DC

## 2016-03-18 NOTE — Telephone Encounter (Signed)
[facility] [address]

## 2016-03-18 NOTE — Telephone Encounter (Signed)
Patient was seen last Friday and was told she had fluid in her right ear , and if she developed any other symptoms to call back for a prescription. Patient states she has been having pressure in her right ear and sinus and would like something called into the pharmacy. She states Amoxicillin does not work for her. [facility] [address]

## 2016-03-22 ENCOUNTER — Telehealth: Payer: Self-pay | Admitting: Physician Assistant

## 2016-03-22 DIAGNOSIS — G43109 Migraine with aura, not intractable, without status migrainosus: Secondary | ICD-10-CM

## 2016-03-22 NOTE — Telephone Encounter (Signed)
I have not received lab results as of yet. May be in faxed stuff that has not been printed and sorted yet.   I will place CT order. Has she seen Neuro?

## 2016-03-22 NOTE — Telephone Encounter (Signed)
Pt would like a call back to get her urine lab results from 03/12/16. Pt would also like to go ahead with the CT scan that was discussed with Dr. Elease HashimotoMaloney. Pt stated that Dr. Elease HashimotoMaloney sent her to ENT for her occular migraines. Pt stated that she went to ENT and eye doctor. Pt stated that after they didn't know how to advise her Dr. Elease HashimotoMaloney suggested that she might want to do a CT scan. Pt stated that she had another migraine onver the weekend but in the other eye and she would like to go ahead and get the CT. Please advise. Thanks TNP

## 2016-03-22 NOTE — Telephone Encounter (Signed)
Please review. Have you seen her lab results?  Thanks,  -Joseline

## 2016-03-24 NOTE — Telephone Encounter (Signed)
Per Patient she wanted the urinalysis urine dipstick results.results given. Per patient is going to get CT tomorrow and the rest of the labs.  Thanks,  -Albino Bufford

## 2016-03-25 ENCOUNTER — Ambulatory Visit
Admission: RE | Admit: 2016-03-25 | Discharge: 2016-03-25 | Disposition: A | Discharge status: Home / Self Care | Payer: No Typology Code available for payment source | Source: Ambulatory Visit | Attending: Physician Assistant | Admitting: Physician Assistant

## 2016-03-25 DIAGNOSIS — G43109 Migraine with aura, not intractable, without status migrainosus: Secondary | ICD-10-CM

## 2016-03-25 MED ORDER — IOPAMIDOL (ISOVUE-300) INJECTION 61%
75.0000 mL | Freq: Once | INTRAVENOUS | Status: AC | PRN
  Administered 2016-03-25: 75 mL via INTRAVENOUS

## 2016-03-26 ENCOUNTER — Telehealth: Payer: Self-pay

## 2016-03-26 ENCOUNTER — Telehealth: Payer: Self-pay | Admitting: Physician Assistant

## 2016-03-26 DIAGNOSIS — G43109 Migraine with aura, not intractable, without status migrainosus: Secondary | ICD-10-CM

## 2016-03-26 LAB — CBC WITH DIFFERENTIAL/PLATELET
BASOS ABS: 0 10*3/uL (ref 0.0–0.2)
Basos: 1 %
EOS (ABSOLUTE): 0.1 10*3/uL (ref 0.0–0.4)
Eos: 2 %
HEMOGLOBIN: 15 g/dL (ref 11.1–15.9)
Hematocrit: 43.5 % (ref 34.0–46.6)
Immature Grans (Abs): 0 10*3/uL (ref 0.0–0.1)
Immature Granulocytes: 0 %
LYMPHS ABS: 2.1 10*3/uL (ref 0.7–3.1)
Lymphs: 35 %
MCH: 30.9 pg (ref 26.6–33.0)
MCHC: 34.5 g/dL (ref 31.5–35.7)
MCV: 90 fL (ref 79–97)
MONOCYTES: 9 %
Monocytes Absolute: 0.5 10*3/uL (ref 0.1–0.9)
NEUTROS ABS: 3.3 10*3/uL (ref 1.4–7.0)
Neutrophils: 53 %
PLATELETS: 250 10*3/uL (ref 150–379)
RBC: 4.86 x10E6/uL (ref 3.77–5.28)
RDW: 13.8 % (ref 12.3–15.4)
WBC: 6.1 10*3/uL (ref 3.4–10.8)

## 2016-03-26 LAB — LIPID PANEL
CHOL/HDL RATIO: 3.7 ratio (ref 0.0–4.4)
Cholesterol, Total: 205 mg/dL — ABNORMAL HIGH (ref 100–199)
HDL: 55 mg/dL (ref >39)
LDL Calculated: 128 mg/dL — ABNORMAL HIGH (ref 0–99)
TRIGLYCERIDES: 112 mg/dL (ref 0–149)
VLDL Cholesterol Cal: 22 mg/dL (ref 5–40)

## 2016-03-26 LAB — COMPREHENSIVE METABOLIC PANEL
ALBUMIN: 4.2 g/dL (ref 3.5–5.5)
ALK PHOS: 86 IU/L (ref 39–117)
ALT: 15 IU/L (ref 0–32)
AST: 15 IU/L (ref 0–40)
Albumin/Globulin Ratio: 1.4 (ref 1.2–2.2)
BILIRUBIN TOTAL: 0.6 mg/dL (ref 0.0–1.2)
BUN/Creatinine Ratio: 27 — ABNORMAL HIGH (ref 9–23)
BUN: 14 mg/dL (ref 6–24)
CHLORIDE: 102 mmol/L (ref 96–106)
CO2: 26 mmol/L (ref 18–29)
CREATININE: 0.52 mg/dL — AB (ref 0.57–1.00)
Calcium: 9.5 mg/dL (ref 8.7–10.2)
GFR calc Af Amer: 126 mL/min/{1.73_m2} (ref >59)
GFR calc non Af Amer: 109 mL/min/{1.73_m2} (ref >59)
GLUCOSE: 104 mg/dL — AB (ref 65–99)
Globulin, Total: 2.9 g/dL (ref 1.5–4.5)
Potassium: 4.6 mmol/L (ref 3.5–5.2)
Sodium: 145 mmol/L — ABNORMAL HIGH (ref 134–144)
Total Protein: 7.1 g/dL (ref 6.0–8.5)

## 2016-03-26 LAB — HEPATITIS C ANTIBODY: Hep C Virus Ab: 0.1 s/co ratio (ref 0.0–0.9)

## 2016-03-26 LAB — HEMOGLOBIN A1C
ESTIMATED AVERAGE GLUCOSE: 103 mg/dL
HEMOGLOBIN A1C: 5.2 % (ref 4.8–5.6)

## 2016-03-26 LAB — TSH: TSH: 0.718 u[IU]/mL (ref 0.450–4.500)

## 2016-03-26 NOTE — Telephone Encounter (Signed)
Patient advised as below. Patient verbalizes understanding and is in agreement with treatment plan. Pt accepted referral to neurology and prefers to be seen ASAP and in town. sd

## 2016-03-26 NOTE — Telephone Encounter (Signed)
-----   Message from Margaretann LovelessJennifer M Burnette, PA-C sent at 03/26/2016  8:21 AM EDT ----- All labs are within normal limits and stable.  Thanks! -JB

## 2016-03-26 NOTE — Telephone Encounter (Signed)
Patient advised as below.  

## 2016-03-26 NOTE — Telephone Encounter (Signed)
Pt called wanting a copy of her most recent labs.  She also ask if her CT results were in yet.  Please advise.  She will come by and pick a copy of her labs of when they are ready  Thank sTeri

## 2016-03-26 NOTE — Telephone Encounter (Signed)
-----   Message from Margaretann LovelessJennifer M Burnette, [place]-C sent at 03/26/2016 11:11 AM EDT ----- CT is unremarkable. There is noted scattered white matter hypodensities that are nonspecific but possibly from chronic but mild small vessel disease c/w sequela from hypertension, high cholesterol or diabetes. 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

## 2016-03-26 NOTE — Telephone Encounter (Signed)
-----   Message from Jennifer M Burnette, PA-C sent at 03/26/2016  8:21 AM EDT ----- All labs are within normal limits and stable.  Thanks! -JB  

## 2016-03-29 ENCOUNTER — Telehealth: Payer: Self-pay | Admitting: Physician Assistant

## 2016-03-29 NOTE — Telephone Encounter (Signed)
Pt states that last couple of times her cholesterol has been checked it has been up.She would like to know if she might need to take medication for this

## 2016-03-29 NOTE — Telephone Encounter (Signed)
No it is not needed yet. Cholesterol is improved from last year but still borderline high. 10-yr cardiovascular risk is only 1.93% which is low. We will continue to monitor.

## 2016-03-29 NOTE — Telephone Encounter (Signed)
Patient advised as below. Patient verbalizes understanding and is in agreement with treatment plan.  

## 2016-04-01 ENCOUNTER — Other Ambulatory Visit: Payer: Self-pay | Admitting: Physician Assistant

## 2016-04-01 DIAGNOSIS — Z1231 Encounter for screening mammogram for malignant neoplasm of breast: Secondary | ICD-10-CM

## 2016-04-09 ENCOUNTER — Other Ambulatory Visit: Payer: Self-pay | Admitting: Family Medicine

## 2016-04-09 DIAGNOSIS — M26609 Unspecified temporomandibular joint disorder, unspecified side: Secondary | ICD-10-CM

## 2016-04-10 NOTE — Telephone Encounter (Signed)
Please review-aa 

## 2016-04-19 ENCOUNTER — Ambulatory Visit: Payer: No Typology Code available for payment source

## 2016-04-22 ENCOUNTER — Ambulatory Visit
Admission: RE | Admit: 2016-04-22 | Discharge: 2016-04-22 | Disposition: A | Discharge status: Home / Self Care | Payer: No Typology Code available for payment source | Source: Ambulatory Visit | Attending: Physician Assistant | Admitting: Physician Assistant

## 2016-04-22 ENCOUNTER — Ambulatory Visit: Payer: No Typology Code available for payment source

## 2016-04-22 ENCOUNTER — Telehealth: Payer: Self-pay

## 2016-04-22 DIAGNOSIS — Z1231 Encounter for screening mammogram for malignant neoplasm of breast: Secondary | ICD-10-CM | POA: Insufficient documentation

## 2016-04-22 NOTE — Telephone Encounter (Signed)
Patient advised as directed below.  Thanks,  -Joseline 

## 2016-04-22 NOTE — Telephone Encounter (Signed)
-----   Message from Margaretann LovelessJennifer M Burnette, [place]-C sent at 04/22/2016 12:32 PM EST ----- Normal mammogram. Repeat screening in one year.

## 2016-05-17 ENCOUNTER — Ambulatory Visit (INDEPENDENT_AMBULATORY_CARE_PROVIDER_SITE_OTHER): Payer: Commercial Managed Care - HMO | Admitting: Physician Assistant

## 2016-05-17 ENCOUNTER — Telehealth: Payer: Self-pay

## 2016-05-17 ENCOUNTER — Encounter: Payer: Self-pay | Admitting: Physician Assistant

## 2016-05-17 VITALS — BP 122/82 | HR 88 | Temp 98.7°F | Resp 16 | Wt 286.0 lb

## 2016-05-17 DIAGNOSIS — B029 Zoster without complications: Secondary | ICD-10-CM

## 2016-05-17 MED ORDER — GABAPENTIN 300 MG PO CAPS: ORAL_CAPSULE | ORAL | 0 refills | Status: DC

## 2016-05-17 MED ORDER — VALACYCLOVIR HCL 1 G PO TABS: 1000.0000 mg | ORAL_TABLET | Freq: Three times a day (TID) | ORAL | 0 refills | Status: AC

## 2016-05-17 NOTE — Progress Notes (Signed)
Patient: Mandy Baldwin Female    DOB: 1960/11/07   55 y.o.   MRN: [id number] Visit Date: 05/18/2016  Today's Provider: Trey SailorsAdriana M Pollak, PA-C   Chief Complaint  Patient presents with  . Rash    Started Friday.  Pt concerned she has shingles.    Subjective:    Rash  This is a new problem. The current episode started in the past 7 days. The problem has been gradually worsening since onset. Location: Left scalp and forehead.  The rash is characterized by pain and burning. She was exposed to nothing.   Patient is a 55 y/o female who presents today with burning/tingling rash  X 3 days. She noticed burning and tingling and then saw a red, bumpy rash on the left side of her scalp only and extending down her face towards her eye. She states it is very painful to touch. No new lotions or products. No rash elsewhere on her body. She reports her vision is unaffected, has not noticed red eye, painful eye. No fever, chills.     Allergies  Allergen Reactions  . Tape Rash    Some bandaids cause rash     Current Outpatient Prescriptions:  .  aspirin (ASPIRIN LOW DOSE) 81 MG tablet, Take 81 mg by mouth daily., Disp: , Rfl:  .  atorvastatin (LIPITOR) 10 MG tablet, Take by mouth., Disp: , Rfl:  .  cholecalciferol (VITAMIN D) 1000 UNITS tablet, Take 2,000 Units by mouth daily., Disp: , Rfl:  .  etodolac (LODINE) 400 MG tablet, TAKE ONE TABLET BY MOUTH TWICE DAILY AS NEEDED, Disp: 60 tablet, Rfl: 5 .  fluticasone (FLONASE) 50 MCG/ACT nasal spray, Place 2 sprays into both nostrils daily., Disp: , Rfl:  .  magnesium oxide (MAG-OX) 400 MG tablet, Take 400 mg by mouth daily., Disp: , Rfl:  .  Multiple Vitamins-Minerals (VISION-VITE PRESERVE PO), Take by mouth., Disp: , Rfl:  .  pantoprazole (PROTONIX) 40 MG tablet, Take 1 tablet (40 mg total) by mouth daily., Disp: 30 tablet, Rfl: 11 .  triamterene-hydrochlorothiazide (MAXZIDE-25) 37.5-25 MG tablet, Take 1 tablet by mouth daily., Disp: 90  tablet, Rfl: 3 .  gabapentin (NEURONTIN) 300 MG capsule, 300 mg once daily on day 1, twice daily on day 2, three times daily on day 3 for one month, Disp: 90 capsule, Rfl: 0 .  valACYclovir (VALTREX) 1000 MG tablet, Take 1 tablet (1,000 mg total) by mouth 3 (three) times daily., Disp: 21 tablet, Rfl: 0  Review of Systems  Constitutional: Negative.   Musculoskeletal: Negative.   Skin: Positive for rash. Negative for color change, pallor and wound.    Social History  Substance Use Topics  . Smoking status: Never Smoker  . Smokeless tobacco: Never Used  . Alcohol use No   Objective:   BP 122/82 (BP Location: Left Wrist, Patient Position: Sitting, Cuff Size: Normal)   Pulse 88   Temp 98.7 F (37.1 C) (Oral)   Resp 16   Wt 286 lb (129.7 kg)   BMI 50.66 kg/m   Physical Exam  Constitutional: She appears well-developed and well-nourished.  HENT:  Head:    Right Ear: Tympanic membrane and external ear normal.  Left Ear: Tympanic membrane and external ear normal.  Clusters of erythematous papules along left side of scalp.  Eyes: Conjunctivae are normal. Pupils are equal, round, and reactive to light. Right eye exhibits no discharge. Left eye exhibits no discharge. No scleral icterus.  Neck: Neck supple.  Lymphadenopathy:    She has no cervical adenopathy.        Assessment & Plan:      Problem List Items Addressed This Visit      Other   Shingles - Primary   Relevant Medications   valACYclovir (VALTREX) 1000 MG tablet   gabapentin (NEURONTIN) 300 MG capsule      Patient is 55 y/o female presenting with symptoms concerning for shingles. Will treat with Valtrex 1000 mg TID x 7 days and gabapentin 300mg  day 1, 600 mg day 2, and 900 mg day 3 and onwards for up to one month or until pain stops. Counseled on pregnancy precautions and spread of virus.  There are no Patient Instructions on file for this visit.  The entirety of the information documented in the History of  Present Illness, Review of Systems and Physical Exam were personally obtained by me. Portions of this information were initially documented by Kavin LeechLaura Ricke Kimoto, CMA and reviewed by me for thoroughness and accuracy.   Return if symptoms worsen or fail to improve.       Trey SailorsAdriana M Pollak, PA-C  [facility]

## 2016-05-17 NOTE — Telephone Encounter (Signed)
Patient reports that she has had a small rash that is located on the top of her head and radiates to the left side of her forehead. She reports that the rash has been there for about 2-3 days. She reports that it is blistery and red, and very sensitive to touch. She denies any fever or chills. Patient reports that it has not spread to her left eye yet, but it is to her left eyebrow. Patient reports that she has not had this before in the past. Patient was scheduled an appt to be seen today in the office at 4pm.

## 2016-05-18 NOTE — Patient Instructions (Signed)
Shingles Shingles, which is also known as herpes zoster, is an infection that causes a painful skin rash and fluid-filled blisters. Shingles is not related to genital herpes, which is a sexually transmitted infection. Shingles only develops in people who:  Have had chickenpox.  Have received the chickenpox vaccine. (This is rare.)  What are the causes? Shingles is caused by varicella-zoster virus (VZV). This is the same virus that causes chickenpox. After exposure to VZV, the virus stays in the body in an inactive (dormant) state. Shingles develops if the virus reactivates. This can happen many years after the initial exposure to VZV. It is not known what causes this virus to reactivate. What increases the risk? People who have had chickenpox or received the chickenpox vaccine are at risk for shingles. Infection is more common in people who:  Are older than age 50.  Have a weakened defense (immune) system, such as those with HIV, AIDS, or cancer.  Are taking medicines that weaken the immune system, such as transplant medicines.  Are under great stress.  What are the signs or symptoms? Early symptoms of this condition include itching, tingling, and pain in an area on your skin. Pain may be described as burning, stabbing, or throbbing. A few days or weeks after symptoms start, a painful red rash appears, usually on one side of the body in a bandlike or beltlike pattern. The rash eventually turns into fluid-filled blisters that break open, scab over, and dry up in about 2-3 weeks. At any time during the infection, you may also develop:  A fever.  Chills.  A headache.  An upset stomach.  How is this diagnosed? This condition is diagnosed with a skin exam. Sometimes, skin or fluid samples are taken from the blisters before a diagnosis is made. These samples are examined under a microscope or sent to a lab for testing. How is this treated? There is no specific cure for this condition.  Your health care provider will probably prescribe medicines to help you manage pain, recover more quickly, and avoid long-term problems. Medicines may include:  Antiviral drugs.  Anti-inflammatory drugs.  Pain medicines.  If the area involved is on your face, you may be referred to a specialist, such as an eye doctor (ophthalmologist) or an ear, nose, and throat (ENT) doctor to help you avoid eye problems, chronic pain, or disability. Follow these instructions at home: Medicines  Take medicines only as directed by your health care provider.  Apply an anti-itch or numbing cream to the affected area as directed by your health care provider. Blister and Rash Care  Take a cool bath or apply cool compresses to the area of the rash or blisters as directed by your health care provider. This may help with pain and itching.  Keep your rash covered with a loose bandage (dressing). Wear loose-fitting clothing to help ease the pain of material rubbing against the rash.  Keep your rash and blisters clean with mild soap and cool water or as directed by your health care provider.  Check your rash every day for signs of infection. These include redness, swelling, and pain that lasts or increases.  Do not pick your blisters.  Do not scratch your rash. General instructions  Rest as directed by your health care provider.  Keep all follow-up visits as directed by your health care provider. This is important.  Until your blisters scab over, your infection can cause chickenpox in people who have never had it or been vaccinated   against it. To prevent this from happening, avoid contact with other people, especially: ? Babies. ? Pregnant women. ? Children who have eczema. ? Elderly people who have transplants. ? People who have chronic illnesses, such as leukemia or AIDS. Contact a health care provider if:  Your pain is not relieved with prescribed medicines.  Your pain does not get better after  the rash heals.  Your rash looks infected. Signs of infection include redness, swelling, and pain that lasts or increases. Get help right away if:  The rash is on your face or nose.  You have facial pain, pain around your eye area, or loss of feeling on one side of your face.  You have ear pain or you have ringing in your ear.  You have loss of taste.  Your condition gets worse. This information is not intended to replace advice given to you by your health care provider. Make sure you discuss any questions you have with your health care provider. Document Released: 05/24/2005 Document Revised: 01/18/2016 Document Reviewed: 04/04/2014 Elsevier Interactive Patient Education  2017 Elsevier Inc.  

## 2016-05-19 ENCOUNTER — Telehealth: Payer: Self-pay | Admitting: Physician Assistant

## 2016-05-19 DIAGNOSIS — B029 Zoster without complications: Secondary | ICD-10-CM

## 2016-05-19 MED ORDER — PREDNISONE 10 MG (21) PO TBPK: 10.0000 mg | ORAL_TABLET | Freq: Every day | ORAL | 0 refills | Status: AC

## 2016-05-19 NOTE — Telephone Encounter (Signed)
Spoke with Ms Creig HinesWestbrook.  She reports her vision and hearing is not affected.  She reports that the pharmacist recommends a prednisone taper.  Please send to [facility] [address]    Thanks,   -Vernona RiegerLaura

## 2016-05-19 NOTE — Telephone Encounter (Signed)
Sent in prednisone 6 day taper.

## 2016-05-19 NOTE — Telephone Encounter (Signed)
Did patient fill valtrex prescription that night and start taking it? Is the staking the full dose three times per day? Is her vision or hearing affected? Ear pain/eye pain? If just some swelling, this is likely just not enough time for the valtrex to work, but if it progresses in another day, would like to see patient back in office.

## 2016-05-19 NOTE — Telephone Encounter (Signed)
Pt states she came in Monday with rash on her head.  Pt states her left check beside of ear is swelling and painful.  Pt is asking is this normal or does she need anything different for this?  [facility] [address]  ZO#[phone]/WJCB#[phone]/MW

## 2016-05-19 NOTE — Telephone Encounter (Signed)
Pt advised.   Thanks,   -Laura  

## 2016-06-04 ENCOUNTER — Other Ambulatory Visit: Payer: Self-pay | Admitting: Physician Assistant

## 2016-06-04 ENCOUNTER — Encounter: Payer: Self-pay | Admitting: Physician Assistant

## 2016-06-04 DIAGNOSIS — B029 Zoster without complications: Secondary | ICD-10-CM

## 2016-06-04 MED ORDER — GABAPENTIN 300 MG PO CAPS: 600.0000 mg | ORAL_CAPSULE | Freq: Two times a day (BID) | ORAL | 0 refills | Status: DC

## 2016-06-04 NOTE — Progress Notes (Signed)
Spoke with JaguasAna, patient not having any new or weeping lesions, just some redness that feels "sunburned." Patient currently on 300 mg TID gabapentin for her shingles. I will increase this to 600 mg twice daily for total of 1200mg . There may be an increase in drowsiness with this so patient should be cautious. Not much topically that can be applied to this as it is a pain from the nerves. I have sent this into [facility] [address]. Thank you.

## 2016-06-04 NOTE — Progress Notes (Signed)
Patient advised. She will let us know if symptoms get worse-aa

## 2016-06-10 ENCOUNTER — Ambulatory Visit: Payer: Commercial Managed Care - HMO | Admitting: Physician Assistant

## 2016-06-10 ENCOUNTER — Ambulatory Visit (INDEPENDENT_AMBULATORY_CARE_PROVIDER_SITE_OTHER): Payer: Commercial Managed Care - HMO | Admitting: Physician Assistant

## 2016-06-10 VITALS — BP 136/84 | HR 84 | Temp 99.2°F | Resp 20 | Wt 286.0 lb

## 2016-06-10 DIAGNOSIS — B029 Zoster without complications: Secondary | ICD-10-CM

## 2016-06-10 DIAGNOSIS — L259 Unspecified contact dermatitis, unspecified cause: Secondary | ICD-10-CM | POA: Diagnosis not present

## 2016-06-10 MED ORDER — VALACYCLOVIR HCL 1 G PO TABS: 1000.0000 mg | ORAL_TABLET | Freq: Three times a day (TID) | ORAL | 0 refills | Status: AC

## 2016-06-10 MED ORDER — TRIAMCINOLONE ACETONIDE 0.1 % EX CREA: 1.0000 "application " | TOPICAL_CREAM | Freq: Two times a day (BID) | CUTANEOUS | 0 refills | Status: DC

## 2016-06-10 NOTE — Progress Notes (Signed)
Patient: Mandy Baldwin Female    DOB: 12-17-1960   56 y.o.   MRN: [id number] Visit Date: 06/11/2016  Today's Provider: Trey Sailors, PA-C   Chief Complaint  Patient presents with  . Herpes Zoster    follow up    Subjective:    HPI Patient comes in today for a follow up on shingles. She did have an episode of shingles about one month ago, which patient reports is much better.   However, she reports that she has developed two rashes on her lower legs. One rash she reports looks like the shingles episode she had in 2014 that was treated with Valtrex and resolved. This is on her right leg. She says it is burning and slightly painful, but not itchy.  There is another distinct rash on bilateral lower legs. These are itchy and red bumps that appeared a few days ago. Patient has recently tried new body wash.    Allergies  Allergen Reactions  . Tape Rash    Some bandaids cause rash     Current Outpatient Prescriptions:  .  aspirin (ASPIRIN LOW DOSE) 81 MG tablet, Take 81 mg by mouth daily., Disp: , Rfl:  .  atorvastatin (LIPITOR) 10 MG tablet, Take by mouth., Disp: , Rfl:  .  cholecalciferol (VITAMIN D) 1000 UNITS tablet, Take 2,000 Units by mouth daily., Disp: , Rfl:  .  etodolac (LODINE) 400 MG tablet, TAKE ONE TABLET BY MOUTH TWICE DAILY AS NEEDED, Disp: 60 tablet, Rfl: 5 .  fluticasone (FLONASE) 50 MCG/ACT nasal spray, Place 2 sprays into both nostrils daily., Disp: , Rfl:  .  gabapentin (NEURONTIN) 300 MG capsule, Take 2 capsules (600 mg total) by mouth 2 (two) times daily., Disp: 90 capsule, Rfl: 0 .  magnesium oxide (MAG-OX) 400 MG tablet, Take 400 mg by mouth daily., Disp: , Rfl:  .  Multiple Vitamins-Minerals (VISION-VITE PRESERVE PO), Take by mouth., Disp: , Rfl:  .  pantoprazole (PROTONIX) 40 MG tablet, Take 1 tablet (40 mg total) by mouth daily., Disp: 30 tablet, Rfl: 11 .  triamterene-hydrochlorothiazide (MAXZIDE-25) 37.5-25 MG tablet, Take 1 tablet by  mouth daily., Disp: 90 tablet, Rfl: 3 .  triamcinolone cream (KENALOG) 0.1 %, Apply 1 application topically 2 (two) times daily., Disp: 30 g, Rfl: 0 .  valACYclovir (VALTREX) 1000 MG tablet, Take 1 tablet (1,000 mg total) by mouth 3 (three) times daily., Disp: 21 tablet, Rfl: 0  Review of Systems  Constitutional: Negative.   Musculoskeletal: Positive for arthralgias, joint swelling and myalgias.  Skin: Positive for rash.    Social History  Substance Use Topics  . Smoking status: Never Smoker  . Smokeless tobacco: Never Used  . Alcohol use No   Objective:   BP 136/84 (BP Location: Left Arm, Patient Position: Sitting, Cuff Size: Large)   Pulse 84   Temp 99.2 F (37.3 C)   Resp 20   Wt 286 lb (129.7 kg)   BMI 50.66 kg/m   Physical Exam  Constitutional: She appears well-developed and well-nourished.  Neurological: She is alert.  Skin: Skin is warm and dry. Rash noted. Rash is maculopapular. She is not diaphoretic. There is erythema.     There are faint papular lesions along left side of face, consistent with resolving shingles lesions as treated last visit.   There is an area of red papules along the patient's right anterior tibia along the dermatome of L4. No pus or weeping.  There are  two areas along the patient's bilateral anterior tibia inferior to the dermatomal rash above. This is maculopapular in nature with some surrounding erythema and evidence of excoriations.  Psychiatric: She has a normal mood and affect. Her behavior is normal.        Assessment & Plan:     1. Herpes zoster without complication  - valACYclovir (VALTREX) 1000 MG tablet; Take 1 tablet (1,000 mg total) by mouth 3 (three) times daily.  Dispense: 21 tablet; Refill: 0  2. Contact dermatitis, unspecified contact dermatitis type, unspecified trigger  - triamcinolone cream (KENALOG) 0.1 %; Apply 1 application topically 2 (two) times daily.  Dispense: 30 g; Refill: 0  Return if symptoms worsen or  fail to improve.   Patient Instructions  Shingles Shingles is an infection that causes a painful skin rash and fluid-filled blisters. Shingles is caused by the same virus that causes chickenpox. Shingles only develops in people who:  Have had chickenpox.  Have gotten the chickenpox vaccine. (This is rare.) The first symptoms of shingles may be itching, tingling, or pain in an area on your skin. A rash will follow in a few days or weeks. The rash is usually on one side of the body in a bandlike or beltlike pattern. Over time, the rash turns into fluid-filled blisters that break open, scab over, and dry up. Medicines may:  Help you manage pain.  Help you recover more quickly.  Help to prevent long-term problems. Follow these instructions at home: Medicines  Take medicines only as told by your doctor.  Apply an anti-itch or numbing cream to the affected area as told by your doctor. Blister and Rash Care  Take a cool bath or put cool compresses on the area of the rash or blisters as told by your doctor. This may help with pain and itching.  Keep your rash covered with a loose bandage (dressing). Wear loose-fitting clothing.  Keep your rash and blisters clean with mild soap and cool water or as told by your doctor.  Check your rash every day for signs of infection. These include redness, swelling, and pain that lasts or gets worse.  Do not pick your blisters.  Do not scratch your rash. General instructions  Rest as told by your doctor.  Keep all follow-up visits as told by your doctor. This is important.  Until your blisters scab over, your infection can cause chickenpox in people who have never had it or been vaccinated against it. To prevent this from happening, avoid touching other people or being around other people, especially:  Babies.  Pregnant women.  Children who have eczema.  Elderly people who have transplants.  People who have chronic illnesses, such as  leukemia or AIDS. Contact a doctor if:  Your pain does not get better with medicine.  Your pain does not get better after the rash heals.  Your rash looks infected. Signs of infection include:  Redness.  Swelling.  Pain that lasts or gets worse. Get help right away if:  The rash is on your face or nose.  You have pain in your face, pain around your eye area, or loss of feeling on one side of your face.  You have ear pain or you have ringing in your ear.  You have loss of taste.  Your condition gets worse. This information is not intended to replace advice given to you by your health care provider. Make sure you discuss any questions you have with your health care provider.  Document Released: 11/10/2007 Document Revised: 01/18/2016 Document Reviewed: 03/05/2014 Elsevier Interactive Patient Education  2017 [employer]     The entirety of the information documented in the History of Present Illness, Review of Systems and Physical Exam were personally obtained by me. Portions of this information were initially documented by [facility] and reviewed by me for thoroughness and accuracy.            Trey SailorsAdriana M Pollak, PA-C  [facility]

## 2016-06-11 NOTE — Patient Instructions (Signed)
Shingles Shingles is an infection that causes a painful skin rash and fluid-filled blisters. Shingles is caused by the same virus that causes chickenpox. Shingles only develops in people who:  Have had chickenpox.  Have gotten the chickenpox vaccine. (This is rare.) The first symptoms of shingles may be itching, tingling, or pain in an area on your skin. A rash will follow in a few days or weeks. The rash is usually on one side of the body in a bandlike or beltlike pattern. Over time, the rash turns into fluid-filled blisters that break open, scab over, and dry up. Medicines may:  Help you manage pain.  Help you recover more quickly.  Help to prevent long-term problems. Follow these instructions at home: Medicines  Take medicines only as told by your doctor.  Apply an anti-itch or numbing cream to the affected area as told by your doctor. Blister and Rash Care  Take a cool bath or put cool compresses on the area of the rash or blisters as told by your doctor. This may help with pain and itching.  Keep your rash covered with a loose bandage (dressing). Wear loose-fitting clothing.  Keep your rash and blisters clean with mild soap and cool water or as told by your doctor.  Check your rash every day for signs of infection. These include redness, swelling, and pain that lasts or gets worse.  Do not pick your blisters.  Do not scratch your rash. General instructions  Rest as told by your doctor.  Keep all follow-up visits as told by your doctor. This is important.  Until your blisters scab over, your infection can cause chickenpox in people who have never had it or been vaccinated against it. To prevent this from happening, avoid touching other people or being around other people, especially:  Babies.  Pregnant women.  Children who have eczema.  Elderly people who have transplants.  People who have chronic illnesses, such as leukemia or AIDS. Contact a doctor if:  Your  pain does not get better with medicine.  Your pain does not get better after the rash heals.  Your rash looks infected. Signs of infection include:  Redness.  Swelling.  Pain that lasts or gets worse. Get help right away if:  The rash is on your face or nose.  You have pain in your face, pain around your eye area, or loss of feeling on one side of your face.  You have ear pain or you have ringing in your ear.  You have loss of taste.  Your condition gets worse. This information is not intended to replace advice given to you by your health care provider. Make sure you discuss any questions you have with your health care provider. Document Released: 11/10/2007 Document Revised: 01/18/2016 Document Reviewed: 03/05/2014 Elsevier Interactive Patient Education  2017 Elsevier Inc.  

## 2016-09-26 ENCOUNTER — Other Ambulatory Visit: Payer: Self-pay | Admitting: Gastroenterology

## 2016-09-27 ENCOUNTER — Encounter: Payer: Self-pay | Admitting: Physician Assistant

## 2016-09-28 ENCOUNTER — Telehealth: Payer: Self-pay | Admitting: Gastroenterology

## 2016-09-28 ENCOUNTER — Other Ambulatory Visit: Payer: Self-pay

## 2016-09-28 MED ORDER — PANTOPRAZOLE SODIUM 40 MG PO TBEC: 40.0000 mg | DELAYED_RELEASE_TABLET | Freq: Every day | ORAL | 11 refills | Status: DC

## 2016-09-28 NOTE — Telephone Encounter (Signed)
*  STAT* If patient is at the pharmacy, call can be transferred to refill team.   1. Which medications need to be refilled? (please list name of each medication and dose if known) pantoprazole (PROTONIX) 40 MG tablet  2. Which pharmacy/location (including street and city if local pharmacy) is medication to be sent to? [facility] [address]   3. Do they need a 30 day or 90 day supply?  90 day

## 2016-09-28 NOTE — Telephone Encounter (Signed)
Rx refill sent to pt's pharmacy per her request.  

## 2016-09-29 ENCOUNTER — Encounter: Payer: Self-pay | Admitting: Physician Assistant

## 2016-10-14 ENCOUNTER — Other Ambulatory Visit: Payer: Self-pay | Admitting: Physician Assistant

## 2016-10-14 DIAGNOSIS — I10 Essential (primary) hypertension: Secondary | ICD-10-CM

## 2016-10-14 MED ORDER — TRIAMTERENE-HCTZ 37.5-25 MG PO TABS: 1.0000 | ORAL_TABLET | Freq: Every day | ORAL | 3 refills | Status: DC

## 2016-10-14 NOTE — Telephone Encounter (Signed)
Pt needs refill on her   triamterene-hydrochlorothiazide (MAXZIDE-25) 37.5-25 MG tablet  She uses [facility] [address]  [place] [address]

## 2016-10-31 ENCOUNTER — Other Ambulatory Visit: Payer: Self-pay | Admitting: Physician Assistant

## 2016-10-31 DIAGNOSIS — M26609 Unspecified temporomandibular joint disorder, unspecified side: Secondary | ICD-10-CM

## 2016-11-02 NOTE — Telephone Encounter (Signed)
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 Last refill 04/12/2016. Allene DillonEmily Drozdowski, CMA

## 2017-03-14 ENCOUNTER — Other Ambulatory Visit: Payer: Self-pay | Admitting: Physician Assistant

## 2017-03-14 DIAGNOSIS — Z1231 Encounter for screening mammogram for malignant neoplasm of breast: Secondary | ICD-10-CM

## 2017-04-01 ENCOUNTER — Encounter: Payer: Self-pay | Admitting: Physician Assistant

## 2017-04-14 ENCOUNTER — Encounter: Payer: Self-pay | Admitting: Physician Assistant

## 2017-04-14 ENCOUNTER — Ambulatory Visit (INDEPENDENT_AMBULATORY_CARE_PROVIDER_SITE_OTHER): Payer: 59 | Admitting: Physician Assistant

## 2017-04-14 VITALS — BP 140/90 | HR 72 | Temp 97.8°F | Resp 16 | Ht 63.0 in | Wt 297.2 lb

## 2017-04-14 DIAGNOSIS — E559 Vitamin D deficiency, unspecified: Secondary | ICD-10-CM

## 2017-04-14 DIAGNOSIS — Z8619 Personal history of other infectious and parasitic diseases: Secondary | ICD-10-CM | POA: Insufficient documentation

## 2017-04-14 DIAGNOSIS — Z23 Encounter for immunization: Secondary | ICD-10-CM

## 2017-04-14 DIAGNOSIS — Z124 Encounter for screening for malignant neoplasm of cervix: Secondary | ICD-10-CM | POA: Diagnosis not present

## 2017-04-14 DIAGNOSIS — E7800 Pure hypercholesterolemia, unspecified: Secondary | ICD-10-CM

## 2017-04-14 DIAGNOSIS — M722 Plantar fascial fibromatosis: Secondary | ICD-10-CM | POA: Diagnosis not present

## 2017-04-14 DIAGNOSIS — Z0000 Encounter for general adult medical examination without abnormal findings: Secondary | ICD-10-CM

## 2017-04-14 DIAGNOSIS — I10 Essential (primary) hypertension: Secondary | ICD-10-CM | POA: Diagnosis not present

## 2017-04-14 DIAGNOSIS — J301 Allergic rhinitis due to pollen: Secondary | ICD-10-CM

## 2017-04-14 DIAGNOSIS — Z8249 Family history of ischemic heart disease and other diseases of the circulatory system: Secondary | ICD-10-CM | POA: Diagnosis not present

## 2017-04-14 DIAGNOSIS — Z1231 Encounter for screening mammogram for malignant neoplasm of breast: Secondary | ICD-10-CM

## 2017-04-14 DIAGNOSIS — Z1239 Encounter for other screening for malignant neoplasm of breast: Secondary | ICD-10-CM

## 2017-04-14 MED ORDER — FLUTICASONE PROPIONATE 50 MCG/ACT NA SUSP: 2.0000 | Freq: Every day | NASAL | 1 refills | Status: DC

## 2017-04-14 NOTE — Patient Instructions (Signed)
Health Maintenance for Postmenopausal Women Menopause is a normal process in which your reproductive ability comes to an end. This process happens gradually over a span of months to years, usually between the ages of 22 and 9. Menopause is complete when you have missed 12 consecutive menstrual periods. It is important to talk with your health care provider about some of the most common conditions that affect postmenopausal women, such as heart disease, cancer, and bone loss (osteoporosis). Adopting a healthy lifestyle and getting preventive care can help to promote your health and wellness. Those actions can also lower your chances of developing some of these common conditions. What should I know about menopause? During menopause, you may experience a number of symptoms, such as:  Moderate-to-severe hot flashes.  Night sweats.  Decrease in sex drive.  Mood swings.  Headaches.  Tiredness.  Irritability.  Memory problems.  Insomnia.  Choosing to treat or not to treat menopausal changes is an individual decision that you make with your health care provider. What should I know about hormone replacement therapy and supplements? Hormone therapy products are effective for treating symptoms that are associated with menopause, such as hot flashes and night sweats. Hormone replacement carries certain risks, especially as you become older. If you are thinking about using estrogen or estrogen with progestin treatments, discuss the benefits and risks with your health care provider. What should I know about heart disease and stroke? Heart disease, heart attack, and stroke become more likely as you age. This may be due, in part, to the hormonal changes that your body experiences during menopause. These can affect how your body processes dietary fats, triglycerides, and cholesterol. Heart attack and stroke are both medical emergencies. There are many things that you can do to help prevent heart disease  and stroke:  Have your blood pressure checked at least every 1-2 years. High blood pressure causes heart disease and increases the risk of stroke.  If you are 53-22 years old, ask your health care provider if you should take aspirin to prevent a heart attack or a stroke.  Do not use any tobacco products, including cigarettes, chewing tobacco, or electronic cigarettes. If you need help quitting, ask your health care provider.  It is important to eat a healthy diet and maintain a healthy weight. ? Be sure to include plenty of vegetables, fruits, low-fat dairy products, and lean protein. ? Avoid eating foods that are high in solid fats, added sugars, or salt (sodium).  Get regular exercise. This is one of the most important things that you can do for your health. ? Try to exercise for at least 150 minutes each week. The type of exercise that you do should increase your heart rate and make you sweat. This is known as moderate-intensity exercise. ? Try to do strengthening exercises at least twice each week. Do these in addition to the moderate-intensity exercise.  Know your numbers.Ask your health care provider to check your cholesterol and your blood glucose. Continue to have your blood tested as directed by your health care provider.  What should I know about cancer screening? There are several types of cancer. Take the following steps to reduce your risk and to catch any cancer development as early as possible. Breast Cancer  Practice breast self-awareness. ? This means understanding how your breasts normally appear and feel. ? It also means doing regular breast self-exams. Let your health care provider know about any changes, no matter how small.  If you are 40  or older, have a clinician do a breast exam (clinical breast exam or CBE) every year. Depending on your age, family history, and medical history, it may be recommended that you also have a yearly breast X-ray (mammogram).  If you  have a family history of breast cancer, talk with your health care provider about genetic screening.  If you are at high risk for breast cancer, talk with your health care provider about having an MRI and a mammogram every year.  Breast cancer (BRCA) gene test is recommended for women who have family members with BRCA-related cancers. Results of the assessment will determine the need for genetic counseling and BRCA1 and for BRCA2 testing. BRCA-related cancers include these types: ? Breast. This occurs in males or females. ? Ovarian. ? Tubal. This may also be called fallopian tube cancer. ? Cancer of the abdominal or pelvic lining (peritoneal cancer). ? Prostate. ? Pancreatic.  Cervical, Uterine, and Ovarian Cancer Your health care provider may recommend that you be screened regularly for cancer of the pelvic organs. These include your ovaries, uterus, and vagina. This screening involves a pelvic exam, which includes checking for microscopic changes to the surface of your cervix (Pap test).  For women ages 21-65, health care providers may recommend a pelvic exam and a Pap test every three years. For women ages 79-65, they may recommend the Pap test and pelvic exam, combined with testing for human papilloma virus (HPV), every five years. Some types of HPV increase your risk of cervical cancer. Testing for HPV may also be done on women of any age who have unclear Pap test results.  Other health care providers may not recommend any screening for nonpregnant women who are considered low risk for pelvic cancer and have no symptoms. Ask your health care provider if a screening pelvic exam is right for you.  If you have had past treatment for cervical cancer or a condition that could lead to cancer, you need Pap tests and screening for cancer for at least 20 years after your treatment. If Pap tests have been discontinued for you, your risk factors (such as having a new sexual partner) need to be  reassessed to determine if you should start having screenings again. Some women have medical problems that increase the chance of getting cervical cancer. In these cases, your health care provider may recommend that you have screening and Pap tests more often.  If you have a family history of uterine cancer or ovarian cancer, talk with your health care provider about genetic screening.  If you have vaginal bleeding after reaching menopause, tell your health care provider.  There are currently no reliable tests available to screen for ovarian cancer.  Lung Cancer Lung cancer screening is recommended for adults 69-62 years old who are at high risk for lung cancer because of a history of smoking. A yearly low-dose CT scan of the lungs is recommended if you:  Currently smoke.  Have a history of at least 30 pack-years of smoking and you currently smoke or have quit within the past 15 years. A pack-year is smoking an average of one pack of cigarettes per day for one year.  Yearly screening should:  Continue until it has been 15 years since you quit.  Stop if you develop a health problem that would prevent you from having lung cancer treatment.  Colorectal Cancer  This type of cancer can be detected and can often be prevented.  Routine colorectal cancer screening usually begins at  age 42 and continues through age 45.  If you have risk factors for colon cancer, your health care provider may recommend that you be screened at an earlier age.  If you have a family history of colorectal cancer, talk with your health care provider about genetic screening.  Your health care provider may also recommend using home test kits to check for hidden blood in your stool.  A small camera at the end of a tube can be used to examine your colon directly (sigmoidoscopy or colonoscopy). This is done to check for the earliest forms of colorectal cancer.  Direct examination of the colon should be repeated every  5-10 years until age 71. However, if early forms of precancerous polyps or small growths are found or if you have a family history or genetic risk for colorectal cancer, you may need to be screened more often.  Skin Cancer  Check your skin from head to toe regularly.  Monitor any moles. Be sure to tell your health care provider: ? About any new moles or changes in moles, especially if there is a change in a mole's shape or color. ? If you have a mole that is larger than the size of a pencil eraser.  If any of your family members has a history of skin cancer, especially at a young age, talk with your health care provider about genetic screening.  Always use sunscreen. Apply sunscreen liberally and repeatedly throughout the day.  Whenever you are outside, protect yourself by wearing long sleeves, pants, a wide-brimmed hat, and sunglasses.  What should I know about osteoporosis? Osteoporosis is a condition in which bone destruction happens more quickly than new bone creation. After menopause, you may be at an increased risk for osteoporosis. To help prevent osteoporosis or the bone fractures that can happen because of osteoporosis, the following is recommended:  If you are 46-71 years old, get at least 1,000 mg of calcium and at least 600 mg of vitamin D per day.  If you are older than age 55 but younger than age 65, get at least 1,200 mg of calcium and at least 600 mg of vitamin D per day.  If you are older than age 54, get at least 1,200 mg of calcium and at least 800 mg of vitamin D per day.  Smoking and excessive alcohol intake increase the risk of osteoporosis. Eat foods that are rich in calcium and vitamin D, and do weight-bearing exercises several times each week as directed by your health care provider. What should I know about how menopause affects my mental health? Depression may occur at any age, but it is more common as you become older. Common symptoms of depression  include:  Low or sad mood.  Changes in sleep patterns.  Changes in appetite or eating patterns.  Feeling an overall lack of motivation or enjoyment of activities that you previously enjoyed.  Frequent crying spells.  Talk with your health care provider if you think that you are experiencing depression. What should I know about immunizations? It is important that you get and maintain your immunizations. These include:  Tetanus, diphtheria, and pertussis (Tdap) booster vaccine.  Influenza every year before the flu season begins.  Pneumonia vaccine.  Shingles vaccine.  Your health care provider may also recommend other immunizations. This information is not intended to replace advice given to you by your health care provider. Make sure you discuss any questions you have with your health care provider. Document Released: 07/16/2005  Document Revised: 12/12/2015 Document Reviewed: 02/25/2015 Elsevier Interactive Patient Education  2018 Elsevier Inc.  

## 2017-04-14 NOTE — Progress Notes (Signed)
Patient: Mandy Baldwin, Female    DOB: 1961/03/27, 56 y.o.   MRN: [id number] Visit Date: 04/14/2017  Today's Provider: Mar Daring, PA-C   Chief Complaint  Patient presents with  . Annual Exam   Subjective:    Annual physical exam Mandy Baldwin is a 56 y.o. female who presents today for health maintenance and complete physical. She feels fairly well. She reports exercising none. She reports she is sleeping well.  03/12/16 CPE 04/22/16 Mammogram-BI-RADS 1 04/09/15 MBD-osteopenia 04/12/2014 Colonoscopy-Dr. Allen Norris; normal recheck in 10 years ----------------------------------------------------------------- Patient C/O cough with green mucus x's several weeks. Patient also c/o right foot pain 3-4 weeks.  Review of Systems  Constitutional: Negative.   HENT: Positive for congestion.   Eyes: Negative.   Respiratory: Positive for cough.   Cardiovascular: Negative.   Gastrointestinal: Negative.   Endocrine: Negative.   Genitourinary: Negative.   Musculoskeletal: Negative.   Skin: Negative.   Allergic/Immunologic: Negative.   Neurological: Negative.   Hematological: Negative.   Psychiatric/Behavioral: Negative.     Social History      She  reports that  has never smoked. she has never used smokeless tobacco. She reports that she does not drink alcohol or use drugs.       Social History   Socioeconomic History  . Marital status: Married    Spouse name: Desiraye Rolfson  . Number of children: 0  . Years of education: 85  . Highest education level: None  Social Needs  . Financial resource strain: None  . Food insecurity - worry: None  . Food insecurity - inability: None  . Transportation needs - medical: None  . Transportation needs - non-medical: None  Occupational History  . Occupation: Therapist, art    Comment: Research scientist (medical): [employer]  Tobacco Use  . Smoking status: Never Smoker  . Smokeless tobacco: Never Used    Substance and Sexual Activity  . Alcohol use: No  . Drug use: No  . Sexual activity: None  Other Topics Concern  . None  Social History Narrative  . None    Past Medical History:  Diagnosis Date  . GERD (gastroesophageal reflux disease)   . Hyperlipidemia   . Hypertension   . [place]  . Osteoarthritis of both knees   . TMJ (dislocation of temporomandibular joint)      Patient Active Problem List   Diagnosis Date Noted  . Swallowing difficulty   . Heartburn   . Stricture and stenosis of esophagus   . Gastritis   . Mass of right breast on mammogram 07/16/2015  . GERD (gastroesophageal reflux disease) 07/14/2015  . Hypertension 07/14/2015  . Internal hemorrhoid, bleeding 06/27/2015  . 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 Avitaminosis D 02/07/2015  . Body mass index of 60 or higher 02/07/2015  . Gravida 0 02/07/2015  . Abnormal glucose 02/07/2015  . Morbid obesity ([place]) 12/14/2014  . Irregular menses 12/14/2014  . Shingles 12/14/2014  . Vitamin D deficiency 12/14/2014  . Ocular migraine 12/14/2014  . Knee pain, right 12/14/2014    Past Surgical History:  Procedure Laterality Date  . COLONOSCOPY    . LAPAROSCOPIC OVARIAN      Family History        Family Status  Relation Name Status  . Mother  Deceased  . Father  Deceased  . Sister  Deceased  . Brother  Alive  .  Sister  Alive  . Sister  Alive  . Brother  Alive        Her family history includes Aortic aneurysm in her mother; Breast cancer (age of onset: 64) in her sister; COPD in her brother and father; Dementia in her mother.     Allergies  Allergen Reactions  . Tape Rash    Some bandaids cause rash     Current Outpatient Medications:  .  aspirin (ASPIRIN LOW DOSE) 81 MG tablet, Take 81 mg by mouth daily., Disp: , Rfl:  .  cholecalciferol (VITAMIN D) 1000 UNITS tablet, Take 2,000 Units by mouth daily., Disp: , Rfl:  .  etodolac (LODINE) 400 MG tablet, TAKE  ONE TABLET BY MOUTH TWICE DAILY AS NEEDED, Disp: 60 tablet, Rfl: 5 .  fluticasone (FLONASE) 50 MCG/ACT nasal spray, Place 2 sprays into both nostrils daily., Disp: , Rfl:  .  magnesium oxide (MAG-OX) 400 MG tablet, Take 400 mg by mouth daily., Disp: , Rfl:  .  Multiple Vitamins-Minerals (VISION-VITE PRESERVE PO), Take by mouth., Disp: , Rfl:  .  pantoprazole (PROTONIX) 40 MG tablet, Take 1 tablet (40 mg total) by mouth daily., Disp: 30 tablet, Rfl: 11 .  triamcinolone cream (KENALOG) 0.1 %, Apply 1 application topically 2 (two) times daily., Disp: 30 g, Rfl: 0 .  triamterene-hydrochlorothiazide (MAXZIDE-25) 37.5-25 MG tablet, Take 1 tablet by mouth daily., Disp: 90 tablet, Rfl: 3 .  atorvastatin (LIPITOR) 10 MG tablet, Take by mouth., Disp: , Rfl:  .  gabapentin (NEURONTIN) 300 MG capsule, Take 2 capsules (600 mg total) by mouth 2 (two) times daily., Disp: 90 capsule, Rfl: 0   Patient Care Team: Mar Daring, PA-C as PCP - General (Family Medicine)      Objective:   Vitals: BP 140/90 (BP Location: Left Arm, Patient Position: Sitting, Cuff Size: Large)   Pulse 72   Temp 97.8 F (36.6 C) (Oral)   Resp 16   Ht 5' 3"  (1.6 m)   Wt 297 lb 3.2 oz (134.8 kg)   SpO2 97%   BMI 52.65 kg/m    Vitals:   04/14/17 1018  BP: 140/90  Pulse: 72  Resp: 16  Temp: 97.8 F (36.6 C)  TempSrc: Oral  SpO2: 97%  Weight: 297 lb 3.2 oz (134.8 kg)  Height: 5' 3"  (1.6 m)     Physical Exam  Constitutional: She is oriented to person, place, and time. She appears well-developed and well-nourished. No distress.  HENT:  Head: Normocephalic and atraumatic.  Right Ear: Hearing, tympanic membrane, external ear and ear canal normal.  Left Ear: Hearing, tympanic membrane, external ear and ear canal normal.  Nose: Nose normal.  Mouth/Throat: Uvula is midline, oropharynx is clear and moist and mucous membranes are normal. No oropharyngeal exudate.  Eyes: Conjunctivae and EOM are normal. Pupils are  equal, round, and reactive to light. Right eye exhibits no discharge. Left eye exhibits no discharge. No scleral icterus.  Neck: Normal range of motion. Neck supple. No JVD present. Carotid bruit is not present. No tracheal deviation present. No thyromegaly present.  Cardiovascular: Normal rate, regular rhythm, normal heart sounds and intact distal pulses. Exam reveals no gallop and no friction rub.  No murmur heard. Pulmonary/Chest: Effort normal and breath sounds normal. No respiratory distress. She has no wheezes. She has no rales. She exhibits no tenderness.  Abdominal: Soft. Bowel sounds are normal. She exhibits no distension and no mass. There is no tenderness. There is no rebound and  no guarding.  Genitourinary:  Genitourinary Comments: Deferred per patient  Musculoskeletal: Normal range of motion. She exhibits no edema or tenderness.  Lymphadenopathy:    She has no cervical adenopathy.  Neurological: She is alert and oriented to person, place, and time.  Skin: Skin is warm and dry. No rash noted. She is not diaphoretic.  Psychiatric: She has a normal mood and affect. Her behavior is normal. Judgment and thought content normal.  Vitals reviewed.    Depression Screen PHQ 2/9 Scores 04/14/2017 03/12/2016 02/07/2015  PHQ - 2 Score 0 0 0  PHQ- 9 Score 1 - -      Assessment & Plan:     Routine Health Maintenance and Physical Exam  Exercise Activities and Dietary recommendations Goals    . Reduce portion size       Immunization History  Administered Date(s) Administered  . MMR 12/24/1998  . Td 03/04/2005    Health Maintenance  Topic Date Due  . TETANUS/TDAP  03/05/2015  . INFLUENZA VACCINE  01/05/2017  . PAP SMEAR  01/18/2017  . MAMMOGRAM  04/22/2018  . COLONOSCOPY  04/12/2024  . Hepatitis C Screening  Completed  . HIV Screening  Discontinued     Discussed health benefits of physical activity, and encouraged her to engage in regular exercise appropriate for her age  and condition.    1. Annual physical exam Normal physical exam today. Will check labs as below and f/u pending lab results. If labs are stable and WNL she will not need to have these rechecked for one year at her next annual physical exam. She is to call the office in the meantime if she has any acute issue, questions or concerns. - CBC w/Diff/Platelet - COMPLETE METABOLIC PANEL WITH GFR - TSH - Lipid Profile - HgB A1c  2. Breast cancer screening Mammogram scheduled next week.   3. Cervical cancer screening Patient defers until next year. No issues.  4. Essential hypertension Elevated today. Continue maxzide 37.5-25mg. Will check labs as below and f/u pending results. - CBC w/Diff/Platelet - COMPLETE METABOLIC PANEL WITH GFR - Lipid Profile - HgB A1c  5. Morbid obesity ([place]) Counseled patient on healthy lifestyle modifications including dieting and exercise.  - COMPLETE METABOLIC PANEL WITH GFR - Lipid Profile - HgB A1c  6. Family history of abdominal aortic aneurysm (AAA) History in her mother requiring repair. Patient has never had screening. Patient declines screening at this time due to cost.   7. Vitamin D deficiency Will check labs as below and f/u pending results. - Vitamin D (25 hydroxy)  8. Hypercholesterolemia Stable on atorvastatin 12m. Will check labs as below and f/u pending results. - CBC w/Diff/Platelet - COMPLETE METABOLIC PANEL WITH GFR - Lipid Profile - HgB A1c  9. Seasonal allergic rhinitis due to pollen Suspect cough due to post nasal drainage over night, as morning is worst time and improves through day. Flonase sent in for patient to restart and use at bedtime to help nighttime drainage.  - fluticasone (FLONASE) 50 MCG/ACT nasal spray; Place 2 sprays daily into both nostrils.  Dispense: 48 g; Refill: 1  10. Plantar fasciitis of right foot Suspect plantar fasciitis. No pain on exam. Tenderness only noted with moving toes/pushing off. Most likely  due to weight and wearing memory foam insoles. Discussed plantar fasciitis insoles, icing with frozen water bottle, stretching. She is to call if symptoms worsen.   11. Need for Tdap vaccination Tdap given to patient without complications. Patient sat for  15 minutes after administration and was tolerated well without adverse effects. - Tdap vaccine greater than or equal to 7yo IM  --------------------------------------------------------------------    Mar Daring, PA-C  [facility]

## 2017-04-15 ENCOUNTER — Telehealth: Payer: Self-pay

## 2017-04-15 ENCOUNTER — Other Ambulatory Visit: Payer: Self-pay | Admitting: Physician Assistant

## 2017-04-15 DIAGNOSIS — E7800 Pure hypercholesterolemia, unspecified: Secondary | ICD-10-CM

## 2017-04-15 LAB — COMPLETE METABOLIC PANEL WITH GFR
AG RATIO: 1.4 (calc) (ref 1.0–2.5)
ALBUMIN MSPROF: 4.2 g/dL (ref 3.6–5.1)
ALT: 30 U/L — AB (ref 6–29)
AST: 22 U/L (ref 10–35)
Alkaline phosphatase (APISO): 80 U/L (ref 33–130)
BILIRUBIN TOTAL: 0.5 mg/dL (ref 0.2–1.2)
BUN: 17 mg/dL (ref 7–25)
CHLORIDE: 104 mmol/L (ref 98–110)
CO2: 28 mmol/L (ref 20–32)
Calcium: 9 mg/dL (ref 8.6–10.4)
Creat: 0.53 mg/dL (ref 0.50–1.05)
GFR, EST AFRICAN AMERICAN: 125 mL/min/{1.73_m2} (ref >=60)
GFR, Est Non African American: 108 mL/min/{1.73_m2} (ref >=60)
GLOBULIN: 2.9 g/dL (ref 1.9–3.7)
Glucose, Bld: 98 mg/dL (ref 65–99)
POTASSIUM: 3.8 mmol/L (ref 3.5–5.3)
SODIUM: 140 mmol/L (ref 135–146)
TOTAL PROTEIN: 7.1 g/dL (ref 6.1–8.1)

## 2017-04-15 LAB — CBC WITH DIFFERENTIAL/PLATELET
BASOS PCT: 0.5 %
Basophils Absolute: 41 cells/uL (ref 0–200)
EOS ABS: 89 {cells}/uL (ref 15–500)
Eosinophils Relative: 1.1 %
HCT: 44.3 % (ref 35.0–45.0)
Hemoglobin: 15.3 g/dL (ref 11.7–15.5)
Lymphs Abs: 2487 cells/uL (ref 850–3900)
MCH: 30.2 pg (ref 27.0–33.0)
MCHC: 34.5 g/dL (ref 32.0–36.0)
MCV: 87.4 fL (ref 80.0–100.0)
MPV: 10.8 fL (ref 7.5–12.5)
Monocytes Relative: 6.8 %
Neutro Abs: 4933 cells/uL (ref 1500–7800)
Neutrophils Relative %: 60.9 %
PLATELETS: 249 10*3/uL (ref 140–400)
RBC: 5.07 10*6/uL (ref 3.80–5.10)
RDW: 12.8 % (ref 11.0–15.0)
TOTAL LYMPHOCYTE: 30.7 %
WBC: 8.1 10*3/uL (ref 3.8–10.8)
WBCMIX: 551 {cells}/uL (ref 200–950)

## 2017-04-15 LAB — HEMOGLOBIN A1C
HEMOGLOBIN A1C: 5.1 %{Hb} (ref <5.7)
Mean Plasma Glucose: 100 (calc)
eAG (mmol/L): 5.5 (calc)

## 2017-04-15 LAB — LIPID PANEL
Cholesterol: 156 mg/dL (ref <200)
HDL: 60 mg/dL (ref >50)
LDL Cholesterol (Calc): 76 mg/dL (calc)
NON-HDL CHOLESTEROL (CALC): 96 mg/dL (ref <130)
Total CHOL/HDL Ratio: 2.6 (calc) (ref <5.0)
Triglycerides: 113 mg/dL (ref <150)

## 2017-04-15 LAB — VITAMIN D 25 HYDROXY (VIT D DEFICIENCY, FRACTURES): Vit D, 25-Hydroxy: 52 ng/mL (ref 30–100)

## 2017-04-15 LAB — TSH: TSH: 0.65 mIU/L

## 2017-04-15 MED ORDER — ATORVASTATIN CALCIUM 10 MG PO TABS: 10.0000 mg | ORAL_TABLET | Freq: Every day | ORAL | 1 refills | Status: DC

## 2017-04-15 NOTE — Telephone Encounter (Signed)
-----   Message from Margaretann LovelessJennifer M Burnette, PA-C sent at 04/15/2017  8:14 AM EST ----- All labs are within normal limits and stable.  Thanks! -JB

## 2017-04-15 NOTE — Telephone Encounter (Signed)
Pt would like to have a refill of atorvastatin 10MG  sent into the [facility] on [address].

## 2017-04-15 NOTE — Telephone Encounter (Signed)
Patient saw results on mychart  

## 2017-04-18 ENCOUNTER — Telehealth: Payer: Self-pay | Admitting: Physician Assistant

## 2017-04-18 NOTE — Telephone Encounter (Signed)
Error/MW °

## 2017-04-25 ENCOUNTER — Ambulatory Visit
Admission: RE | Admit: 2017-04-25 | Discharge: 2017-04-25 | Disposition: A | Discharge status: Home / Self Care | Payer: 59 | Source: Ambulatory Visit | Attending: Physician Assistant | Admitting: Physician Assistant

## 2017-04-25 DIAGNOSIS — Z1231 Encounter for screening mammogram for malignant neoplasm of breast: Secondary | ICD-10-CM | POA: Diagnosis present

## 2017-04-27 ENCOUNTER — Other Ambulatory Visit: Payer: Self-pay | Admitting: Physician Assistant

## 2017-04-27 ENCOUNTER — Telehealth: Payer: Self-pay

## 2017-04-27 DIAGNOSIS — R928 Other abnormal and inconclusive findings on diagnostic imaging of breast: Secondary | ICD-10-CM

## 2017-04-27 DIAGNOSIS — N6310 Unspecified lump in the right breast, unspecified quadrant: Secondary | ICD-10-CM

## 2017-04-27 IMAGING — MG 2D DIGITAL SCREENING BILATERAL MAMMOGRAM WITH CAD AND ADJUNCT TO
8 of 14 series · 8 of 30 positions shown · non-contrast
Comparison: Previous exam(s).

ACR Breast Density Category a: The breast tissue is almost entirely
fatty.

CLINICAL DATA: Screening.

EXAM:
2D DIGITAL SCREENING BILATERAL MAMMOGRAM WITH CAD AND ADJUNCT TOMO

[L CV]
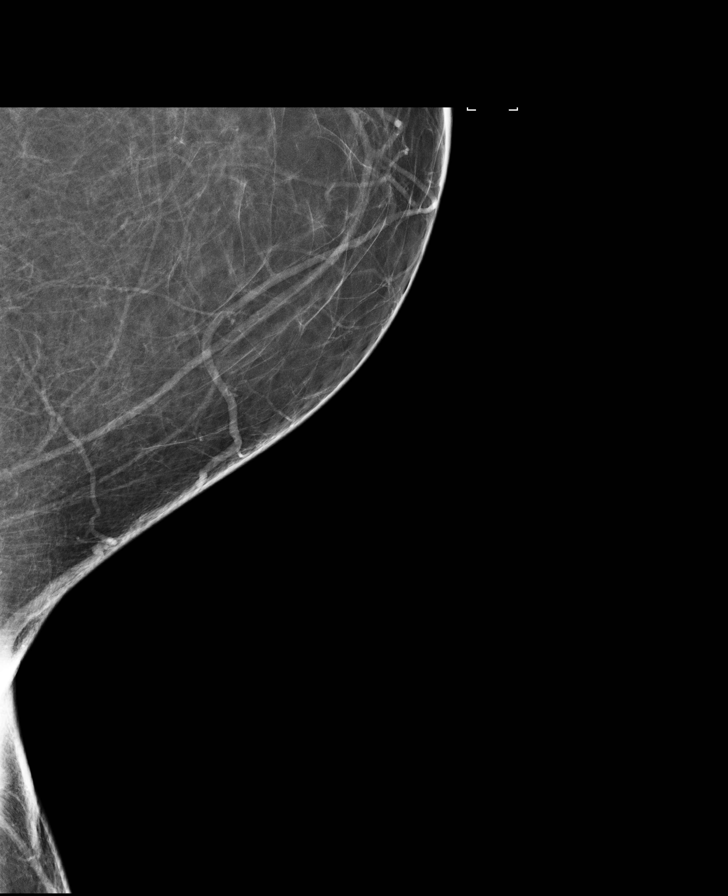

[R XCCL]
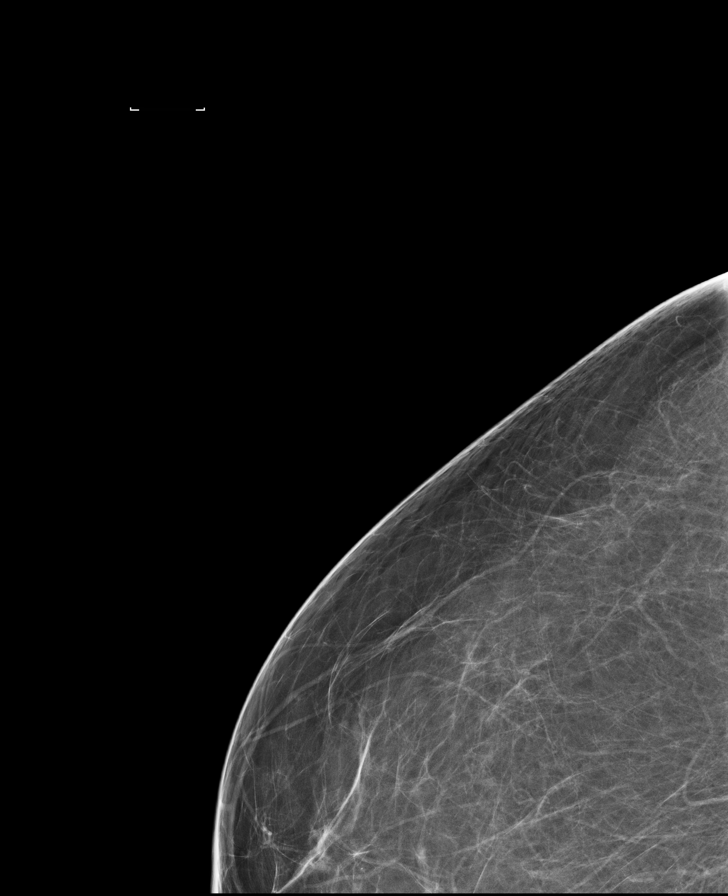

[R MLO synth-2D]
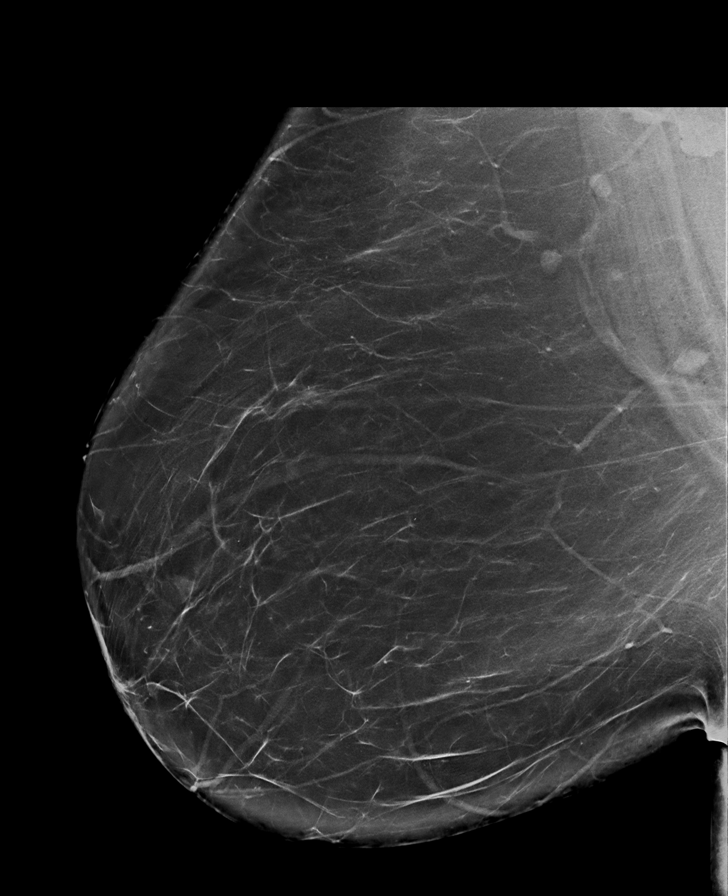

[R CC synth-2D]
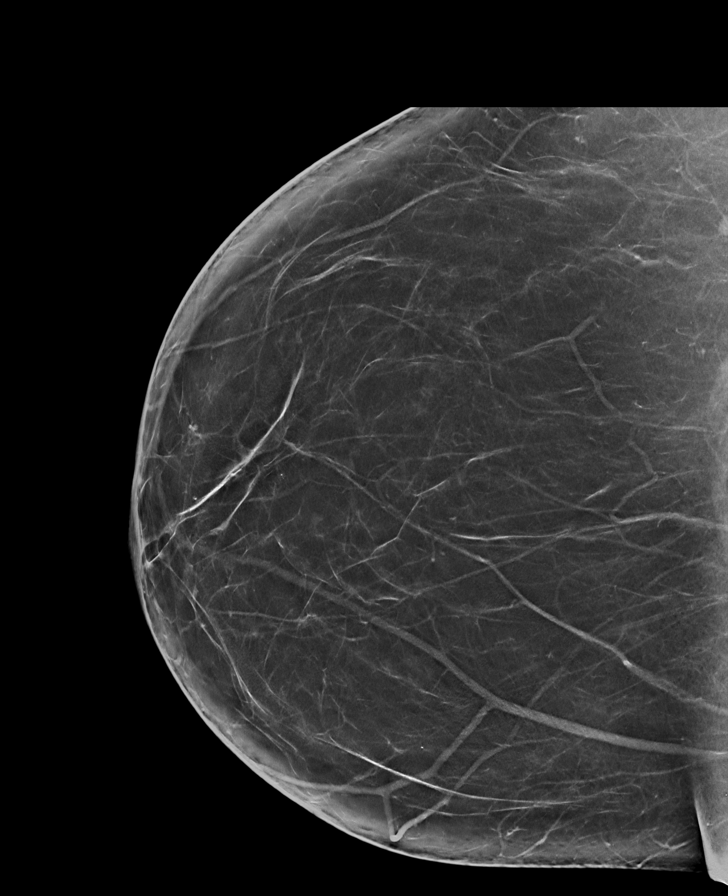

[R MLO]
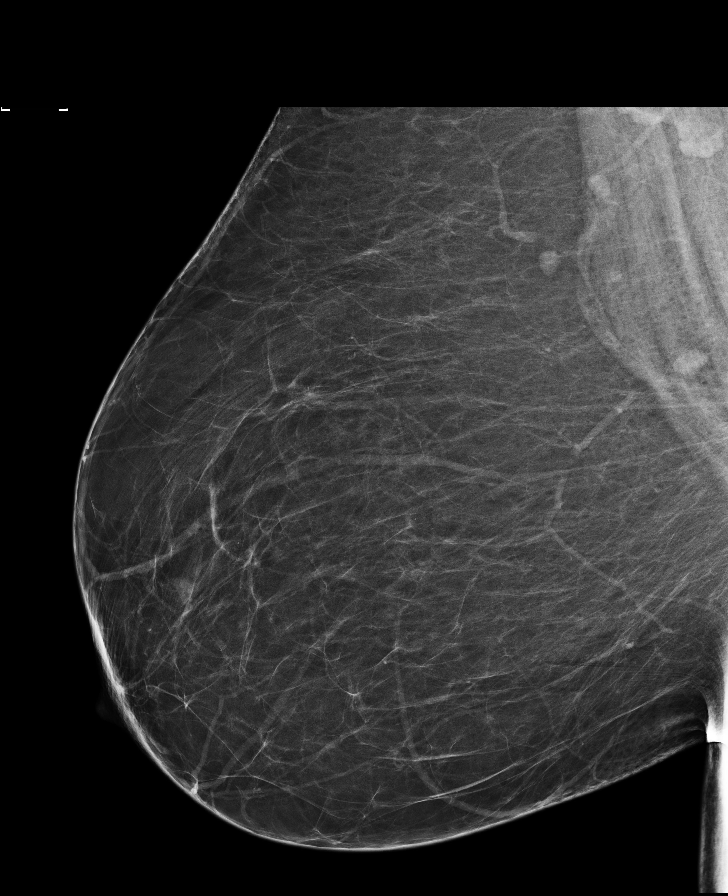

[L MLO]
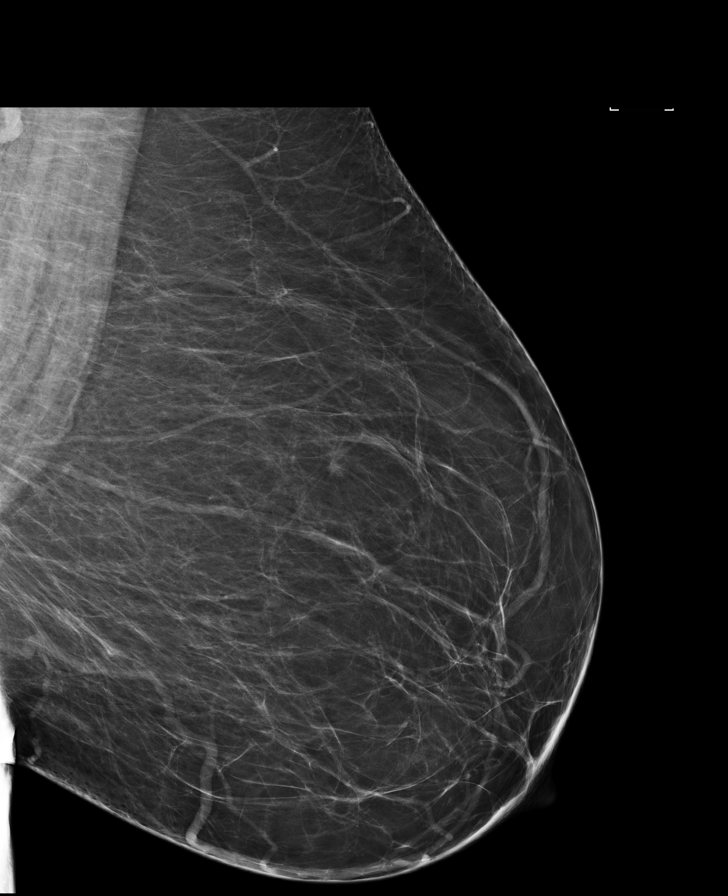

[L CC synth-2D]
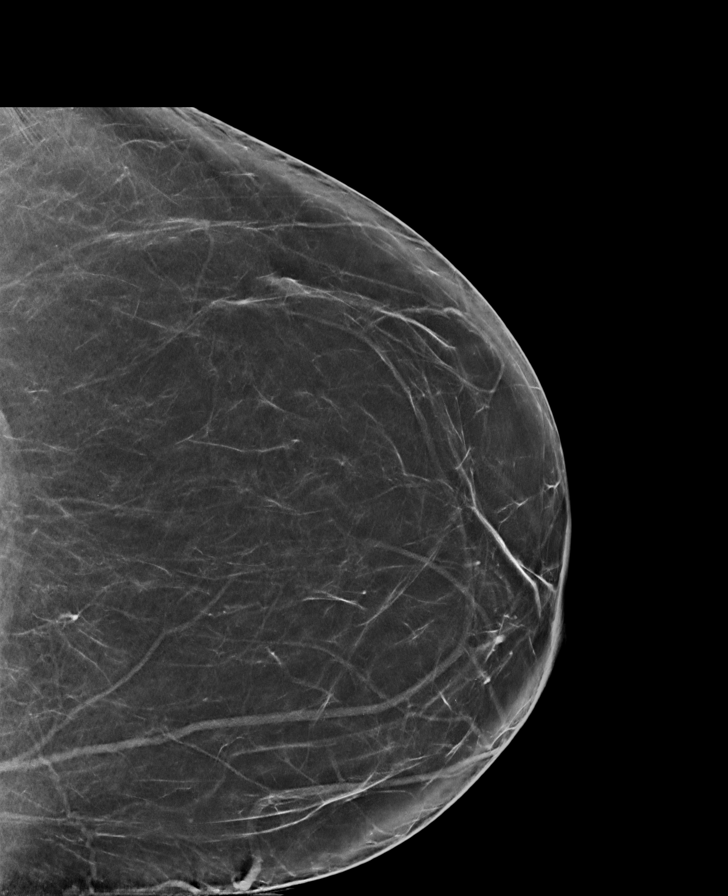

[R CC]
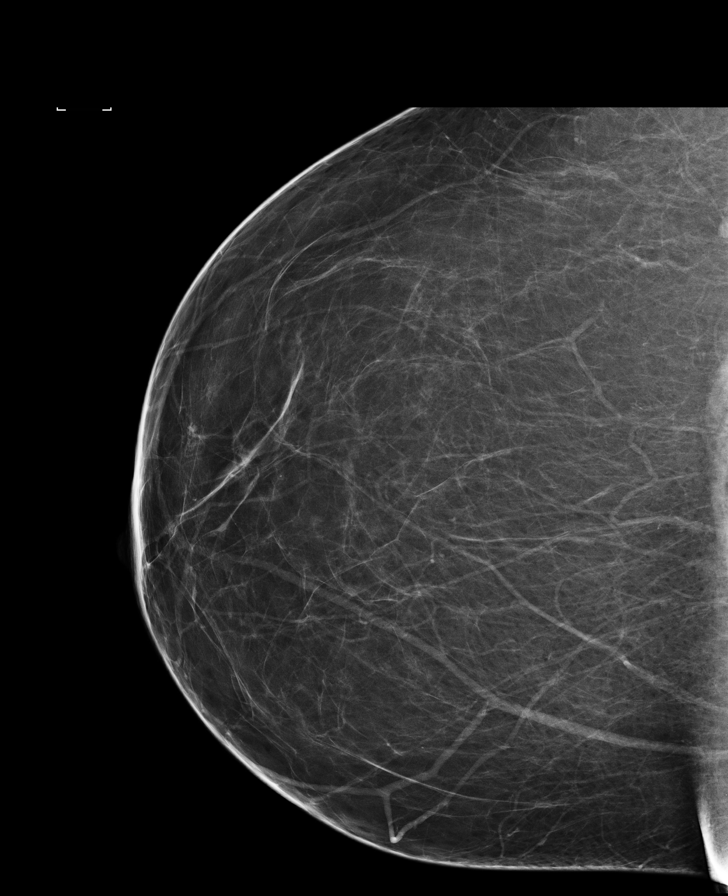

[8 of 30 positions shown; findings below may reference images not displayed]

FINDINGS: There are no findings suspicious for malignancy. Images were
processed with CAD.
IMPRESSION: No mammographic evidence of malignancy. A result letter of this
screening mammogram will be mailed directly to the patient.

RECOMMENDATION:
Screening mammogram in one year. (Code:1B-X-8P0)

BI-RADS CATEGORY  1: Negative.

## 2017-04-27 NOTE — Telephone Encounter (Signed)
-----   Message from Margaretann LovelessJennifer M Burnette, [place]PA-C sent at 04/27/2017  8:37 AM EST ----- Abnormality noted in right breast on mammogram. [facility] should be contacting patient in the coming days to schedule a diagnostic mammogram and US of the right breast for further evaluation of the abnormal area. Being that this is a holiday week, it is possible she may not hear anything until Monday or so next week.

## 2017-04-27 NOTE — Telephone Encounter (Signed)
Patient advised as directed below.  Thanks,  -Burnie Therien 

## 2017-05-03 ENCOUNTER — Ambulatory Visit
Admission: RE | Admit: 2017-05-03 | Discharge: 2017-05-03 | Disposition: A | Discharge status: Home / Self Care | Payer: 59 | Source: Ambulatory Visit | Attending: Physician Assistant | Admitting: Physician Assistant

## 2017-05-03 DIAGNOSIS — N6489 Other specified disorders of breast: Secondary | ICD-10-CM | POA: Diagnosis not present

## 2017-05-03 DIAGNOSIS — N6310 Unspecified lump in the right breast, unspecified quadrant: Secondary | ICD-10-CM

## 2017-05-03 DIAGNOSIS — N6001 Solitary cyst of right breast: Secondary | ICD-10-CM | POA: Diagnosis not present

## 2017-05-03 DIAGNOSIS — R928 Other abnormal and inconclusive findings on diagnostic imaging of breast: Secondary | ICD-10-CM

## 2017-05-03 DIAGNOSIS — N6312 Unspecified lump in the right breast, upper inner quadrant: Secondary | ICD-10-CM | POA: Diagnosis not present

## 2017-05-04 ENCOUNTER — Telehealth: Payer: Self-pay

## 2017-05-04 NOTE — Telephone Encounter (Signed)
lmtcb

## 2017-05-04 NOTE — Telephone Encounter (Signed)
Patient advised as below.  

## 2017-05-04 NOTE — Telephone Encounter (Signed)
-----   Message from Margaretann LovelessJennifer M Burnette, [place]PA-C sent at 05/04/2017  8:22 AM EST ----- Benign and unchanged cyst in right breast. Recommend to return to normal annual screenings.

## 2017-05-05 ENCOUNTER — Telehealth: Payer: Self-pay

## 2017-05-05 NOTE — Telephone Encounter (Signed)
Patient advised as directed below.  Thanks,  -Kemani Demarais 

## 2017-05-05 NOTE — Telephone Encounter (Signed)
-----   Message from Margaretann LovelessJennifer M Burnette, [place]PA-C sent at 05/05/2017 11:26 AM EST ----- Benign cyst noted as abnormality in right breast. Recommended to return to annual mammograms.

## 2017-05-28 ENCOUNTER — Other Ambulatory Visit: Payer: Self-pay | Admitting: Physician Assistant

## 2017-05-28 DIAGNOSIS — M26609 Unspecified temporomandibular joint disorder, unspecified side: Secondary | ICD-10-CM

## 2017-06-30 ENCOUNTER — Encounter: Payer: Self-pay | Admitting: Physician Assistant

## 2017-06-30 ENCOUNTER — Telehealth: Payer: Self-pay

## 2017-06-30 ENCOUNTER — Ambulatory Visit (INDEPENDENT_AMBULATORY_CARE_PROVIDER_SITE_OTHER): Payer: 59 | Admitting: Physician Assistant

## 2017-06-30 VITALS — BP 134/82 | HR 92 | Temp 98.7°F | Resp 16 | Wt 293.0 lb

## 2017-06-30 DIAGNOSIS — A084 Viral intestinal infection, unspecified: Secondary | ICD-10-CM | POA: Diagnosis not present

## 2017-06-30 MED ORDER — PROMETHAZINE HCL 12.5 MG PO TABS: 12.5000 mg | ORAL_TABLET | Freq: Three times a day (TID) | ORAL | 0 refills | Status: DC | PRN

## 2017-06-30 NOTE — Patient Instructions (Signed)

## 2017-06-30 NOTE — Telephone Encounter (Signed)
Noted, thank you

## 2017-06-30 NOTE — Telephone Encounter (Signed)
Patient called this morning stating that she is having burning in her chest which started yesterday 06/29/17 off and on all day, started vomiting last night as she went to lay down to sleep. She has diarrhea, chills this morning, temp this morning was 98.9. Some tingling in her left shoulder. No blood in stool but states she had a little bit of blood when she was vomiting. She states she ate fish last night for dinner with her husband and her husband is not feeling sick. Patient denies chest pain, no chest pressure or tightness, no dyspnea, no blurred vision or dizziness. Consulted with Joycelyn ManJennifer Burnette, PA-patient's provider. Per provider this is most likely a GI bug going around and she also has history of gastritis Nand GERD and is probably a mixture of all of this. Appointment available this afternoon with Mandy Baldwin but patient was advised she can come in the morning and see someone else. Patient is coming in this morning at 11 am to see Mandy AngstAdriana Pollak, PA. Patient advised if she got worse or developed symptoms mentioned above to go to the ER. Patient understood-Idabelle Mcpeters Ander PurpuraV Kharter Brew, RMA

## 2017-06-30 NOTE — Progress Notes (Signed)
Patient: Mandy Baldwin Female    DOB: 1960-07-08   57 y.o.   MRN: [id number] Visit Date: 06/30/2017  Today's Provider: Trey SailorsAdriana M Narmeen Kerper, PA-C   Chief Complaint  Patient presents with  . Emesis    Started Yesterday  . Diarrhea    Started yesterday   Subjective:    Mandy Baldwin is a 57 y/o woman presenting with the below symptoms. Also has mild grade headache. Has had tingling in her left shoulder for the past several days and wants to know if this is stroke.  Emesis   This is a new problem. The current episode started yesterday. The problem occurs less than 2 times per day. The problem has been unchanged. Associated symptoms include chills, diarrhea and headaches. Pertinent negatives include no abdominal pain, arthralgias, coughing, dizziness, fever, myalgias, sweats, URI or weight loss.  Diarrhea   This is a new problem. The current episode started yesterday. The problem occurs less than 2 times per day. The patient states that diarrhea does not awaken her from sleep. Associated symptoms include chills, headaches and vomiting. Pertinent negatives include no abdominal pain, arthralgias, bloating, coughing, fever, increased  flatus, myalgias, sweats, URI or weight loss. There are no known risk factors.       Allergies  Allergen Reactions  . Tape Rash    Some bandaids cause rash     Current Outpatient Medications:  .  aspirin (ASPIRIN LOW DOSE) 81 MG tablet, Take 81 mg by mouth daily., Disp: , Rfl:  .  atorvastatin (LIPITOR) 10 MG tablet, Take 1 tablet (10 mg total) daily by mouth., Disp: 90 tablet, Rfl: 1 .  cholecalciferol (VITAMIN D) 1000 UNITS tablet, Take 2,000 Units by mouth daily., Disp: , Rfl:  .  etodolac (LODINE) 400 MG tablet, TAKE 1 TABLET BY MOUTH TWICE DAILY AS NEEDED, Disp: 90 tablet, Rfl: 1 .  fluticasone (FLONASE) 50 MCG/ACT nasal spray, Place 2 sprays daily into both nostrils., Disp: 48 g, Rfl: 1 .  magnesium oxide (MAG-OX) 400 MG tablet,  Take 400 mg by mouth daily., Disp: , Rfl:  .  Multiple Vitamins-Minerals (VISION-VITE PRESERVE PO), Take by mouth., Disp: , Rfl:  .  pantoprazole (PROTONIX) 40 MG tablet, Take 1 tablet (40 mg total) by mouth daily., Disp: 30 tablet, Rfl: 11 .  triamterene-hydrochlorothiazide (MAXZIDE-25) 37.5-25 MG tablet, Take 1 tablet by mouth daily., Disp: 90 tablet, Rfl: 3 .  gabapentin (NEURONTIN) 300 MG capsule, Take 2 capsules (600 mg total) by mouth 2 (two) times daily., Disp: 90 capsule, Rfl: 0 .  triamcinolone cream (KENALOG) 0.1 %, Apply 1 application topically 2 (two) times daily. (Patient not taking: Reported on 06/30/2017), Disp: 30 g, Rfl: 0  Review of Systems  Constitutional: Positive for appetite change, chills and fatigue. Negative for activity change, diaphoresis, fever, unexpected weight change and weight loss.  HENT: Positive for sinus pressure and sinus pain.   Respiratory: Negative for apnea, cough, choking, chest tightness, shortness of breath, wheezing and stridor.   Cardiovascular: Negative for leg swelling.  Gastrointestinal: Positive for diarrhea, nausea and vomiting. Negative for abdominal distention, abdominal pain, anal bleeding, bloating, blood in stool, constipation, flatus and rectal pain.  Musculoskeletal: Negative for arthralgias and myalgias.  Neurological: Positive for headaches. Negative for dizziness and light-headedness.  Psychiatric/Behavioral: Positive for agitation.    Social History   Tobacco Use  . Smoking status: Never Smoker  . Smokeless tobacco: Never Used  Substance Use Topics  . Alcohol  use: No   Objective:   BP 134/82 (BP Location: Left Arm, Patient Position: Sitting, Cuff Size: Large)   Pulse 92   Temp 98.7 F (37.1 C) (Oral)   Resp 16   Wt 293 lb (132.9 kg)   SpO2 99%   BMI 51.90 kg/m  Vitals:   06/30/17 1108  BP: 134/82  Pulse: 92  Resp: 16  Temp: 98.7 F (37.1 C)  TempSrc: Oral  SpO2: 99%  Weight: 293 lb (132.9 kg)     Physical  Exam  Constitutional: She is oriented to person, place, and time. She appears well-developed and well-nourished.  Cardiovascular: Normal rate and regular rhythm.  Pulmonary/Chest: Effort normal and breath sounds normal.  Abdominal: Soft. Bowel sounds are normal. There is no tenderness.  Neurological: She is alert and oriented to person, place, and time.  Skin: Skin is warm and dry.  Psychiatric: She has a normal mood and affect. Her behavior is normal.        Assessment & Plan:     1. Viral gastroenteritis  Do not think this represents stroke. Treat with antiemetics and advance diet as tolerated.  - promethazine (PHENERGAN) 12.5 MG tablet; Take 1 tablet (12.5 mg total) by mouth every 8 (eight) hours as needed for nausea or vomiting.  Dispense: 20 tablet; Refill: 0  No Follow-up on file.  The entirety of the information documented in the History of Present Illness, Review of Systems and Physical Exam were personally obtained by me. Portions of this information were initially documented by Kavin Leech, CMA and reviewed by me for thoroughness and accuracy.        Trey Sailors, PA-C  [facility]

## 2017-07-25 ENCOUNTER — Encounter: Payer: Self-pay | Admitting: Physician Assistant

## 2017-07-27 ENCOUNTER — Encounter: Payer: Self-pay | Admitting: Emergency Medicine

## 2017-07-27 ENCOUNTER — Other Ambulatory Visit: Payer: Self-pay

## 2017-07-27 ENCOUNTER — Emergency Department
Admission: EM | Admit: 2017-07-27 | Discharge: 2017-07-27 | Disposition: A | Discharge status: Home / Self Care | Payer: 59 | Attending: Emergency Medicine | Admitting: Emergency Medicine

## 2017-07-27 ENCOUNTER — Emergency Department: Payer: 59

## 2017-07-27 DIAGNOSIS — R05 Cough: Secondary | ICD-10-CM | POA: Diagnosis not present

## 2017-07-27 DIAGNOSIS — R002 Palpitations: Secondary | ICD-10-CM | POA: Diagnosis present

## 2017-07-27 DIAGNOSIS — R000 Tachycardia, unspecified: Secondary | ICD-10-CM | POA: Diagnosis not present

## 2017-07-27 DIAGNOSIS — M542 Cervicalgia: Secondary | ICD-10-CM | POA: Diagnosis not present

## 2017-07-27 DIAGNOSIS — E876 Hypokalemia: Secondary | ICD-10-CM | POA: Insufficient documentation

## 2017-07-27 DIAGNOSIS — I10 Essential (primary) hypertension: Secondary | ICD-10-CM | POA: Insufficient documentation

## 2017-07-27 LAB — URINALYSIS, COMPLETE (UACMP) WITH MICROSCOPIC
BACTERIA UA: NONE SEEN
Bilirubin Urine: NEGATIVE
Glucose, UA: NEGATIVE mg/dL
Hgb urine dipstick: NEGATIVE
Ketones, ur: 5 mg/dL — AB
Leukocytes, UA: NEGATIVE
Nitrite: NEGATIVE
PROTEIN: NEGATIVE mg/dL
SQUAMOUS EPITHELIAL / LPF: NONE SEEN
Specific Gravity, Urine: 1.015 (ref 1.005–1.030)
pH: 5 (ref 5.0–8.0)

## 2017-07-27 LAB — CBC
HCT: 48.3 % — ABNORMAL HIGH (ref 35.0–47.0)
Hemoglobin: 16.3 g/dL — ABNORMAL HIGH (ref 12.0–16.0)
MCH: 30.1 pg (ref 26.0–34.0)
MCHC: 33.8 g/dL (ref 32.0–36.0)
MCV: 89.2 fL (ref 80.0–100.0)
PLATELETS: 245 10*3/uL (ref 150–440)
RBC: 5.41 MIL/uL — ABNORMAL HIGH (ref 3.80–5.20)
RDW: 13.3 % (ref 11.5–14.5)
WBC: 12.1 10*3/uL — ABNORMAL HIGH (ref 3.6–11.0)

## 2017-07-27 LAB — BASIC METABOLIC PANEL
Anion gap: 12 (ref 5–15)
BUN: 17 mg/dL (ref 6–20)
CALCIUM: 9.3 mg/dL (ref 8.9–10.3)
CO2: 26 mmol/L (ref 22–32)
CREATININE: 0.59 mg/dL (ref 0.44–1.00)
Chloride: 101 mmol/L (ref 101–111)
GFR calc Af Amer: >60 mL/min (ref >60)
GLUCOSE: 130 mg/dL — AB (ref 65–99)
Potassium: 3.4 mmol/L — ABNORMAL LOW (ref 3.5–5.1)
Sodium: 139 mmol/L (ref 135–145)

## 2017-07-27 LAB — TROPONIN I

## 2017-07-27 LAB — TSH: TSH: 1.146 u[IU]/mL (ref 0.350–4.500)

## 2017-07-27 MED ORDER — ONDANSETRON 4 MG PO TBDP: 4.0000 mg | ORAL_TABLET | Freq: Three times a day (TID) | ORAL | 0 refills | Status: DC | PRN

## 2017-07-27 MED ORDER — ONDANSETRON 4 MG PO TBDP
4.0000 mg | ORAL_TABLET | Freq: Once | ORAL | Status: AC
  Administered 2017-07-27: 4 mg via ORAL
  Filled 2017-07-27: qty 1

## 2017-07-27 MED ORDER — ONDANSETRON 4 MG PO TBDP
ORAL_TABLET | ORAL | Status: AC
  Administered 2017-07-27: 4 mg via ORAL
  Filled 2017-07-27: qty 1

## 2017-07-27 MED ORDER — ONDANSETRON 4 MG PO TBDP
4.0000 mg | ORAL_TABLET | Freq: Once | ORAL | Status: AC
  Administered 2017-07-27: 4 mg via ORAL

## 2017-07-27 MED ORDER — ASPIRIN 81 MG PO CHEW
324.0000 mg | CHEWABLE_TABLET | Freq: Once | ORAL | Status: AC
  Administered 2017-07-27: 243 mg via ORAL
  Filled 2017-07-27: qty 4

## 2017-07-27 MED ORDER — POTASSIUM CHLORIDE CRYS ER 20 MEQ PO TBCR
40.0000 meq | EXTENDED_RELEASE_TABLET | Freq: Once | ORAL | Status: AC
  Administered 2017-07-27: 40 meq via ORAL
  Filled 2017-07-27: qty 2

## 2017-07-27 MED ORDER — ONDANSETRON HCL 4 MG/2ML IJ SOLN: 4.0000 mg | Freq: Once | INTRAMUSCULAR | Status: DC

## 2017-07-27 NOTE — ED Notes (Addendum)
Pt returns from xray; c/o dizziness; vs retaken and pt updated on wait time

## 2017-07-27 NOTE — Discharge Instructions (Signed)
Please drink plenty of fluids to stay well-hydrated.  Make a follow-up appointment with your primary care physician for reevaluation.  Return to the emergency department if you develop severe pain, lightheadedness or fainting, fever, inability to keep down fluids, or any other symptoms concerning to you.

## 2017-07-27 NOTE — ED Provider Notes (Signed)
[facility] Emergency Department Provider Note  ____________________________________________  Time seen: Approximately 9:07 PM  I have reviewed the triage vital signs and the nursing notes.   HISTORY  Chief Complaint Hypertension and Neck Pain    HPI Mandy Baldwin is a 57 y.o. female with a history of HTN, HL, GERD, presenting with palpitations, left neck pain, cough.  The patient reports that for the past week she has had a cough that is productive of phlegm without any fevers or chills, congestion or rhinorrhea, ear pain or sore throat.  Tonight, she was sitting at home prior to dinner when she developed a quick heart beating sensation, palpitations.  She noted that her heart rate was quick, and that she was hypertensive this was not associated with lightheadedness or syncope, chest pain or discomfort, shortness of breath.  In route to the emergency department, the patient did have a significant left lateral neck cramp that radiated down into her shoulder and gave her tingling in her left hand.  The symptoms have completely resolved.  The patient additionally notes nausea without vomiting.  She took a low-dose aspirin at home for her symptoms.  Past Medical History:  Diagnosis Date  . GERD (gastroesophageal reflux disease)   . Hyperlipidemia   . Hypertension   . Motion sickness    [place]  . Osteoarthritis of both knees   . Shingles 12/14/2014  . TMJ (dislocation of temporomandibular joint)     Patient Active Problem List   Diagnosis Date Noted  . History of shingles 04/14/2017  . Family history of abdominal aortic aneurysm (AAA) 04/14/2017  . Hypercholesterolemia 04/14/2017  . Heartburn   . Stricture and stenosis of esophagus   . Gastritis   . Mass of right breast on mammogram 07/16/2015  . GERD (gastroesophageal reflux disease) 07/14/2015  . Hypertension 07/14/2015  . Internal hemorrhoid, bleeding 06/27/2015  . 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 BMI 50.0-59.9, adult (HCC) 02/07/2015  . Gravida 0 02/07/2015  . Abnormal glucose 02/07/2015  . Morbid obesity (HCC) 12/14/2014  . Irregular menses 12/14/2014  . Vitamin D deficiency 12/14/2014  . Ocular migraine 12/14/2014  . Osteoarthritis of right knee 12/14/2014    Past Surgical History:  Procedure Laterality Date  . COLONOSCOPY    . ESOPHAGOGASTRODUODENOSCOPY (EGD) WITH PROPOFOL N/A 09/18/2015   Procedure: ESOPHAGOGASTRODUODENOSCOPY (EGD) WITH PROPOFOL with ballon dilatation;  Surgeon: [name], MD;  Location: [facility];  Service: Endoscopy;  Laterality: N/A;  . LAPAROSCOPIC OVARIAN      Current Outpatient Rx  . Order #: [id number] Class: Historical Med  . Order #: [id number] Class: Normal  . Order #: [id number] Class: Historical Med  . Order #: [id number] Class: Normal  . Order #: [id number] Class: Normal  . Order #: [id number] Class: Normal  . Order #: [id number] Class: Historical Med  . Order #: [id number] Class: Historical Med  . Order #: [id number] Class: Print  . Order #: [id number] Class: Normal  . Order #: [id number] Class: Normal  . Order #: [id number] Class: Normal  . Order #: [id number] Class: Normal    Allergies Tape  Family History  Problem Relation Age of Onset  . Dementia Mother   . Aortic aneurysm Mother   . COPD Father   . Breast cancer Sister 68  . COPD Brother     Social History Social History   Tobacco Use  . Smoking status: Never Smoker  . Smokeless tobacco: Never Used  Substance Use Topics  . Alcohol use: No  . Drug use: No  Review of Systems Constitutional: No fever/chills.  P.  Positive general malaise. Eyes: No visual changes. ENT: No sore throat. No congestion or rhinorrhea. Cardiovascular: Denies chest pain.  Positive palpitations. Respiratory: Denies shortness of breath.  Positive cough. Gastrointestinal: No abdominal pain.  Positive nausea, no vomiting.  No diarrhea.  No constipation. Genitourinary:  Negative for dysuria. Musculoskeletal: Negative for back pain. Skin: Negative for rash. Neurological: Negative for headaches. No focal numbness, tingling or weakness.  No difficulty walking.    ____________________________________________   PHYSICAL EXAM:  VITAL SIGNS: ED Triage Vitals  Enc Vitals Group     BP 07/27/17 1926 (!) 176/114     Pulse Rate 07/27/17 1926 (!) 129     Resp 07/27/17 1926 20     Temp 07/27/17 1926 98.3 F (36.8 C)     Temp Source 07/27/17 1926 Oral     SpO2 07/27/17 1926 98 %     Weight 07/27/17 1927 290 lb (131.5 kg)     Height 07/27/17 1927 5\' 4"  (1.626 m)     Head Circumference --      Peak Flow --      Pain Score --      Pain Loc --      Pain Edu? --      Excl. in GC? --     Constitutional: Alert and oriented. Answers questions appropriately.  Chronically ill-appearing but nontoxic. Eyes: Conjunctivae are normal.  EOMI. No scleral icterus. Head: Atraumatic. Nose: No congestion/rhinnorhea. Mouth/Throat: Mucous membranes are mildly dry.  Neck: No stridor.  Supple.  No JVD.  No meningismus. Cardiovascular: Normal rate, regular rhythm. No murmurs, rubs or gallops.  Respiratory: Normal respiratory effort.  No accessory muscle use or retractions. Lungs CTAB.  No wheezes, rales or ronchi. Gastrointestinal: Obese.  Soft, nontender and nondistended.  No guarding or rebound.  No peritoneal signs. Musculoskeletal: No LE edema. No ttp in the calves or palpable cords.  Negative Homan's sign. Neurologic:  A&Ox3.  Speech is clear.  Face and smile are symmetric.  EOMI.  Moves all extremities well. Skin:  Skin is warm, dry and intact. No rash noted. Psychiatric: Affect is anxious.  Mood is depressed.  ____________________________________________   LABS (all labs ordered are listed, but only abnormal results are displayed)  Labs Reviewed  BASIC METABOLIC PANEL - Abnormal; Notable for the following components:      Result Value   Potassium 3.4 (*)     Glucose, Bld 130 (*)    All other components within normal limits  CBC - Abnormal; Notable for the following components:   WBC 12.1 (*)    RBC 5.41 (*)    Hemoglobin 16.3 (*)    HCT 48.3 (*)    All other components within normal limits  URINALYSIS, COMPLETE (UACMP) WITH MICROSCOPIC - Abnormal; Notable for the following components:   Color, Urine YELLOW (*)    APPearance CLEAR (*)    Ketones, ur 5 (*)    All other components within normal limits  TROPONIN I  TROPONIN I  TSH   ____________________________________________  EKG  ED ECG REPORT I, Rockne Menghini, the attending physician, personally viewed and interpreted this ECG.   Date: 07/27/2017  EKG Time: 1926  Rate: 120  Rhythm: sinus tachycardia  Axis: leftward  Intervals:none  ST&T Change: No STEMI  ____________________________________________  RADIOLOGY  Dg Chest 2 View  Result Date: 07/27/2017 CLINICAL DATA:  Left-sided chest and neck pain beginning today. Productive cough for 1 week. Hypertension. EXAM:  CHEST  2 VIEW COMPARISON:  07/13/2015 FINDINGS: The heart size and mediastinal contours are within normal limits. Both lungs are clear. Mild thoracic dextroscoliosis again noted. IMPRESSION: Stable exam.  No active cardiopulmonary disease. Electronically Signed   By: Myles RosenthalJohn  Stahl M.D.   On: 07/27/2017 19:56    ____________________________________________   PROCEDURES  Procedure(s) performed: None  Procedures  Critical Care performed: No ____________________________________________   INITIAL IMPRESSION / ASSESSMENT AND PLAN / ED COURSE  Pertinent labs & imaging results that were available during my care of the patient were reviewed by me and considered in my medical decision making (see chart for details).  57 y.o. female presenting with several days of cough symptoms, now with an episode of palpitations without any chest pain or shortness of breath.  Overall, the patient initially was hypertensive  and tachycardic, but her without intervention her heart rate has normalized.  She is afebrile.  He is mildly hypokalemic with a potassium of 3.4 and I will give her oral supplementation and encouraged her to have this rechecked by her primary care physician.  Her laboratory studies also appear to be hemoconcentrated, and she does have dry mucous membranes so it is possible that her symptoms come from dehydration especially in the setting of this recent URI symptoms.  The patient does not have any evidence of pneumonia.  Her chest x-ray is otherwise without any acute cardiopulmonary process.  I will plan to check orthostatics on the patient, rule out UTI, and treat her for her nausea.  Plan reevaluation for final disposition.  ----------------------------------------- 10:41 PM on 07/27/2017 -----------------------------------------  The patient's workup in the emergency department is reassuring.  Her TSH is within normal limits.  Her EKG does not show any ischemia and her troponin is negative.  She is mildly hypokalemic but we have supplemented that and she will follow that up with her primary care physician.  The patient's chest x-ray does not show any acute cardiopulmonary process.  Her heart rate has normalized and her blood pressure has improved.  She is feeling better at this time and has been tolerating liquids without vomiting.  At this time, the patient is stable for discharge home.  She will follow-up with her primary care physician.  Return precautions were discussed.  ____________________________________________  FINAL CLINICAL IMPRESSION(S) / ED DIAGNOSES  Final diagnoses:  Hypokalemia  Neck pain on left side  Palpitations  Sinus tachycardia         NEW MEDICATIONS STARTED DURING THIS VISIT:  New Prescriptions   ONDANSETRON (ZOFRAN ODT) 4 MG DISINTEGRATING TABLET    Take 1 tablet (4 mg total) by mouth every 8 (eight) hours as needed for nausea or vomiting.      Rockne MenghiniNorman,  Anne-Caroline, MD 07/27/17 2242

## 2017-07-27 NOTE — ED Notes (Signed)
Patient vomiting at this time after drinking fluids.

## 2017-07-27 NOTE — ED Triage Notes (Signed)
Patient arrives to ER from home via POV for c/o left neck pain with HTN (160/108). Patient currently on HTN and cholesterol medications. Patient very anxious upon arrival. Patient denies shortness of breath, chest pain, nausea, dizziness, weakness or diaphoresis. States she did feel like she had "flushing" sensation to chest with "cramping" sensation to left side of neck. States symptoms began at approx 1845. Patient took 81mg  ASA prior to arrival. States she has had chest congestion with coughing up phlegm for approx one week.

## 2017-07-28 ENCOUNTER — Telehealth: Payer: Self-pay | Admitting: Physician Assistant

## 2017-07-28 NOTE — Telephone Encounter (Signed)
Mucus can be present due to her being dehydrated. Can also be caused if early UTI or from kidney stones. We can always recheck to make sure it is cleared if she wishes.

## 2017-07-28 NOTE — Telephone Encounter (Signed)
Patient advised as directed below. Per patient she was told she was dehydrated if anything changes she will give us a call.  Thanks,  -Lessly Stigler

## 2017-07-28 NOTE — Telephone Encounter (Signed)
Please Review

## 2017-07-28 NOTE — Telephone Encounter (Signed)
Pt went to ER last night at [facility] for increased heart rate, nausea.   ER determined she was dehydrated.   When they did urine, mucous showed up in her urine.  Should she be concerned about that??

## 2017-07-29 ENCOUNTER — Ambulatory Visit (INDEPENDENT_AMBULATORY_CARE_PROVIDER_SITE_OTHER): Payer: 59 | Admitting: Family Medicine

## 2017-07-29 ENCOUNTER — Encounter: Payer: Self-pay | Admitting: Family Medicine

## 2017-07-29 VITALS — BP 170/104 | HR 93 | Temp 98.8°F | Wt 296.8 lb

## 2017-07-29 DIAGNOSIS — J0100 Acute maxillary sinusitis, unspecified: Secondary | ICD-10-CM | POA: Diagnosis not present

## 2017-07-29 DIAGNOSIS — R059 Cough, unspecified: Secondary | ICD-10-CM

## 2017-07-29 DIAGNOSIS — R05 Cough: Secondary | ICD-10-CM | POA: Diagnosis not present

## 2017-07-29 DIAGNOSIS — E876 Hypokalemia: Secondary | ICD-10-CM

## 2017-07-29 DIAGNOSIS — I10 Essential (primary) hypertension: Secondary | ICD-10-CM | POA: Diagnosis not present

## 2017-07-29 MED ORDER — METOPROLOL SUCCINATE ER 25 MG PO TB24: 25.0000 mg | ORAL_TABLET | Freq: Every day | ORAL | 3 refills | Status: DC

## 2017-07-29 MED ORDER — AZITHROMYCIN 250 MG PO TABS: ORAL_TABLET | ORAL | 0 refills | Status: DC

## 2017-07-29 NOTE — Progress Notes (Signed)
Patient: Mandy Baldwin Female    DOB: 25-Feb-1961   57 y.o.   MRN: [id number] Visit Date: 07/29/2017  Today's Provider: Dortha Kernennis Dexton Zwilling, PA   Chief Complaint  Patient presents with  . Cough   Subjective:    Cough  This is a new problem. Episode onset: 1 week. The problem has been unchanged. The problem occurs every few minutes. The cough is productive of sputum.  Patient was seen on 07/27/17 at [facility] ER for elevated blood pressure and palpitations. She was diagnosed with Hypokalemia, Palpitations, and Tachycardia. She was prescribed Phenergan for nausea.  Patient Active Problem List   Diagnosis Date Noted  . History of shingles 04/14/2017  . Family history of abdominal aortic aneurysm (AAA) 04/14/2017  . Hypercholesterolemia 04/14/2017  . Heartburn   . Stricture and stenosis of esophagus   . Gastritis   . Mass of right breast on mammogram 07/16/2015  . GERD (gastroesophageal reflux disease) 07/14/2015  . Hypertension 07/14/2015  . Internal hemorrhoid, bleeding 06/27/2015  . 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 BMI 50.0-59.9, adult (HCC) 02/07/2015  . Gravida 0 02/07/2015  . Abnormal glucose 02/07/2015  . Morbid obesity (HCC) 12/14/2014  . Irregular menses 12/14/2014  . Vitamin D deficiency 12/14/2014  . Ocular migraine 12/14/2014  . Osteoarthritis of right knee 12/14/2014   Past Surgical History:  Procedure Laterality Date  . COLONOSCOPY    . ESOPHAGOGASTRODUODENOSCOPY (EGD) WITH PROPOFOL N/A 09/18/2015   Procedure: ESOPHAGOGASTRODUODENOSCOPY (EGD) WITH PROPOFOL with ballon dilatation;  Surgeon: Midge Miniumarren Wohl, MD;  Location: [facility];  Service: Endoscopy;  Laterality: N/A;  . LAPAROSCOPIC OVARIAN     Family History  Problem Relation Age of Onset  . Dementia Mother   . Aortic aneurysm Mother   . COPD Father   . Breast cancer Sister 1340  . COPD Brother    Allergies  Allergen Reactions  . Tape Rash    Some bandaids cause rash     Current Outpatient Medications:  .  aspirin (ASPIRIN LOW DOSE) 81 MG tablet, Take 81 mg by mouth daily., Disp: , Rfl:  .  atorvastatin (LIPITOR) 10 MG tablet, Take 1 tablet (10 mg total) daily by mouth., Disp: 90 tablet, Rfl: 1 .  cholecalciferol (VITAMIN D) 1000 UNITS tablet, Take 2,000 Units by mouth daily., Disp: , Rfl:  .  etodolac (LODINE) 400 MG tablet, TAKE 1 TABLET BY MOUTH TWICE DAILY AS NEEDED, Disp: 90 tablet, Rfl: 1 .  fluticasone (FLONASE) 50 MCG/ACT nasal spray, Place 2 sprays daily into both nostrils., Disp: 48 g, Rfl: 1 .  magnesium oxide (MAG-OX) 400 MG tablet, Take 400 mg by mouth daily., Disp: , Rfl:  .  Multiple Vitamins-Minerals (VISION-VITE PRESERVE PO), Take by mouth., Disp: , Rfl:  .  ondansetron (ZOFRAN ODT) 4 MG disintegrating tablet, Take 1 tablet (4 mg total) by mouth every 8 (eight) hours as needed for nausea or vomiting., Disp: 20 tablet, Rfl: 0 .  pantoprazole (PROTONIX) 40 MG tablet, Take 1 tablet (40 mg total) by mouth daily., Disp: 30 tablet, Rfl: 11 .  promethazine (PHENERGAN) 12.5 MG tablet, Take 1 tablet (12.5 mg total) by mouth every 8 (eight) hours as needed for nausea or vomiting., Disp: 20 tablet, Rfl: 0 .  triamterene-hydrochlorothiazide (MAXZIDE-25) 37.5-25 MG tablet, Take 1 tablet by mouth daily., Disp: 90 tablet, Rfl: 3 .  gabapentin (NEURONTIN) 300 MG capsule, Take 2 capsules (600 mg total) by mouth 2 (two) times daily., Disp: 90  capsule, Rfl: 0 .  triamcinolone cream (KENALOG) 0.1 %, Apply 1 application topically 2 (two) times daily. (Patient not taking: Reported on 06/30/2017), Disp: 30 g, Rfl: 0   Review of Systems  Constitutional: Negative.   HENT: Positive for congestion.   Respiratory: Positive for cough.   Cardiovascular: Negative.    Social History   Tobacco Use  . Smoking status: Never Smoker  . Smokeless tobacco: Never Used  Substance Use Topics  . Alcohol use: No   Objective:   BP (!) 170/104 (BP Location: Left Arm, Patient  Position: Sitting, Cuff Size: Normal)   Pulse 93   Temp 98.8 F (37.1 C) (Oral)   Wt 296 lb 12.8 oz (134.6 kg)   SpO2 96%   BMI 50.95 kg/m   Physical Exam  Constitutional: She is oriented to person, place, and time. She appears well-developed and well-nourished. No distress.  HENT:  Head: Normocephalic and atraumatic.  Right Ear: Hearing and external ear normal.  Left Ear: Hearing and external ear normal.  Nose: Nose normal.  Slightly tender sinuses with poor transillumination of maxillary sinuses.  Eyes: Conjunctivae and lids are normal. Right eye exhibits no discharge. Left eye exhibits no discharge. No scleral icterus.  Pulmonary/Chest: Effort normal. No respiratory distress.  Musculoskeletal: Normal range of motion.  Neurological: She is alert and oriented to person, place, and time.  Skin: Skin is intact. No lesion and no rash noted.  Psychiatric: She has a normal mood and affect. Her speech is normal and behavior is normal. Thought content normal.      Assessment & Plan:     1. Subacute maxillary sinusitis Onset with cough, PND and sinus discomfort the past week. No fever. Poor to no transillumination of maxillary sinuses. Treat with Z-pak and OTC cough/congestion medications. Recheck labs and follow up prn. - azithromycin (ZITHROMAX) 250 MG tablet; Take 2 tablets by mouth today then one daily for 4 days.  Dispense: 6 tablet; Refill: 0 - CBC with Differential/Platelet  2. Cough Onset over the past week without fever. Some yellowish sputum and scratchy throat. Suspect secondary to sinusitis with PND. WBC count in the ER was 12,100. Treat with Z-pak and may continue Delsym. Recheck prn. - CBC with Differential/Platelet  3. Essential hypertension BP up to 176/114 and pulse 129 in the ER on 07-27-17. Better pulse and BP still high. Has had some palpitations a couple times at night. Will add Toprol-XL at bedtime and continue Maxzide-25 qd. Recheck CBC and CMP. Follow BP at home  and recheck pending lab reports. - metoprolol succinate (TOPROL-XL) 25 MG 24 hr tablet; Take 1 tablet (25 mg total) by mouth daily.  Dispense: 30 tablet; Refill: 3 - CBC with Differential/Platelet - Comprehensive metabolic panel  4. Hypokalemia Potassium level was 3.4 in the ER on 07-27-17 with high BP and tachycardia. Was given KCL tablet. Recommend recheck labs and increase fluids with potassium rich foods. Recheck pending lab reports. - Comprehensive metabolic panel       Mandy Kern, PA  [facility]

## 2017-07-30 LAB — COMPREHENSIVE METABOLIC PANEL
A/G RATIO: 1.3 (ref 1.2–2.2)
ALT: 23 IU/L (ref 0–32)
AST: 17 IU/L (ref 0–40)
Albumin: 4.3 g/dL (ref 3.5–5.5)
Alkaline Phosphatase: 101 IU/L (ref 39–117)
BILIRUBIN TOTAL: 0.3 mg/dL (ref 0.0–1.2)
BUN / CREAT RATIO: 24 — AB (ref 9–23)
BUN: 18 mg/dL (ref 6–24)
CHLORIDE: 102 mmol/L (ref 96–106)
CO2: 23 mmol/L (ref 20–29)
Calcium: 9.7 mg/dL (ref 8.7–10.2)
Creatinine, Ser: 0.74 mg/dL (ref 0.57–1.00)
GFR calc non Af Amer: 92 mL/min/{1.73_m2} (ref >59)
GFR, EST AFRICAN AMERICAN: 106 mL/min/{1.73_m2} (ref >59)
GLOBULIN, TOTAL: 3.3 g/dL (ref 1.5–4.5)
Glucose: 106 mg/dL — ABNORMAL HIGH (ref 65–99)
POTASSIUM: 4.2 mmol/L (ref 3.5–5.2)
SODIUM: 140 mmol/L (ref 134–144)
TOTAL PROTEIN: 7.6 g/dL (ref 6.0–8.5)

## 2017-07-30 LAB — CBC WITH DIFFERENTIAL/PLATELET
BASOS: 0 %
Basophils Absolute: 0 10*3/uL (ref 0.0–0.2)
EOS (ABSOLUTE): 0.1 10*3/uL (ref 0.0–0.4)
Eos: 1 %
Hematocrit: 46.7 % — ABNORMAL HIGH (ref 34.0–46.6)
Hemoglobin: 15.7 g/dL (ref 11.1–15.9)
IMMATURE GRANS (ABS): 0 10*3/uL (ref 0.0–0.1)
Immature Granulocytes: 0 %
Lymphocytes Absolute: 3.1 10*3/uL (ref 0.7–3.1)
Lymphs: 25 %
MCH: 31 pg (ref 26.6–33.0)
MCHC: 33.6 g/dL (ref 31.5–35.7)
MCV: 92 fL (ref 79–97)
Monocytes Absolute: 1 10*3/uL — ABNORMAL HIGH (ref 0.1–0.9)
Monocytes: 8 %
NEUTROS ABS: 8.5 10*3/uL — AB (ref 1.4–7.0)
Neutrophils: 66 %
PLATELETS: 293 10*3/uL (ref 150–379)
RBC: 5.06 x10E6/uL (ref 3.77–5.28)
RDW: 13.8 % (ref 12.3–15.4)
WBC: 12.8 10*3/uL — ABNORMAL HIGH (ref 3.4–10.8)

## 2017-08-04 ENCOUNTER — Ambulatory Visit (INDEPENDENT_AMBULATORY_CARE_PROVIDER_SITE_OTHER): Payer: 59 | Admitting: Physician Assistant

## 2017-08-04 ENCOUNTER — Encounter: Payer: Self-pay | Admitting: Physician Assistant

## 2017-08-04 VITALS — BP 148/100 | HR 92 | Temp 98.4°F | Resp 16 | Wt 289.0 lb

## 2017-08-04 DIAGNOSIS — I10 Essential (primary) hypertension: Secondary | ICD-10-CM

## 2017-08-04 DIAGNOSIS — D72829 Elevated white blood cell count, unspecified: Secondary | ICD-10-CM | POA: Diagnosis not present

## 2017-08-04 DIAGNOSIS — R000 Tachycardia, unspecified: Secondary | ICD-10-CM | POA: Diagnosis not present

## 2017-08-04 DIAGNOSIS — R002 Palpitations: Secondary | ICD-10-CM | POA: Diagnosis not present

## 2017-08-04 MED ORDER — METOPROLOL SUCCINATE ER 25 MG PO TB24: 50.0000 mg | ORAL_TABLET | Freq: Every day | ORAL | 3 refills | Status: DC

## 2017-08-04 NOTE — Progress Notes (Signed)
Patient: Mandy Baldwin Female    DOB: March 12, 1961   57 y.o.   MRN: [id number] Visit Date: 08/04/2017  Today's Provider: Margaretann Loveless, PA-C   Chief Complaint  Patient presents with  . Follow-up   Subjective:    HPI  Follow up for Sinusitis and Hypertension  The patient was last seen for this 1 weeks ago. Changes made at last visit include start Z pak and Metoprolol.  She reports excellent compliance with treatment. She feels that condition is Improved. She is not having side effects.   Patient reports she had another episode of rapid heart rate last night. Patient reports heart rate was 111, and BP was 155/91. Patient denise chest pain, shortness of breath, head ache, nausea or vomiting last night. Patient reports she has been drinking Crystal Light Green tea for about one year. Patient reports she did review side effects and palpitations is a side effect of green tea.  ------------------------------------------------------------------------------------     Allergies  Allergen Reactions  . Tape Rash    Some bandaids cause rash     Current Outpatient Medications:  .  aspirin (ASPIRIN LOW DOSE) 81 MG tablet, Take 81 mg by mouth daily., Disp: , Rfl:  .  atorvastatin (LIPITOR) 10 MG tablet, Take 1 tablet (10 mg total) daily by mouth., Disp: 90 tablet, Rfl: 1 .  cholecalciferol (VITAMIN D) 1000 UNITS tablet, Take 2,000 Units by mouth daily., Disp: , Rfl:  .  etodolac (LODINE) 400 MG tablet, TAKE 1 TABLET BY MOUTH TWICE DAILY AS NEEDED, Disp: 90 tablet, Rfl: 1 .  fluticasone (FLONASE) 50 MCG/ACT nasal spray, Place 2 sprays daily into both nostrils., Disp: 48 g, Rfl: 1 .  magnesium oxide (MAG-OX) 400 MG tablet, Take 400 mg by mouth daily., Disp: , Rfl:  .  metoprolol succinate (TOPROL-XL) 25 MG 24 hr tablet, Take 1 tablet (25 mg total) by mouth daily., Disp: 30 tablet, Rfl: 3 .  Multiple Vitamins-Minerals (MULTIVITAL PO), Take 1 tablet by mouth daily., Disp: ,  Rfl:  .  ondansetron (ZOFRAN ODT) 4 MG disintegrating tablet, Take 1 tablet (4 mg total) by mouth every 8 (eight) hours as needed for nausea or vomiting., Disp: 20 tablet, Rfl: 0 .  pantoprazole (PROTONIX) 40 MG tablet, Take 1 tablet (40 mg total) by mouth daily., Disp: 30 tablet, Rfl: 11 .  promethazine (PHENERGAN) 12.5 MG tablet, Take 1 tablet (12.5 mg total) by mouth every 8 (eight) hours as needed for nausea or vomiting., Disp: 20 tablet, Rfl: 0 .  triamterene-hydrochlorothiazide (MAXZIDE-25) 37.5-25 MG tablet, Take 1 tablet by mouth daily., Disp: 90 tablet, Rfl: 3  Review of Systems  Constitutional: Negative.   Respiratory: Negative.   Cardiovascular: Positive for palpitations. Negative for chest pain and leg swelling.  Gastrointestinal: Negative.   Neurological: Negative.     Social History   Tobacco Use  . Smoking status: Never Smoker  . Smokeless tobacco: Never Used  Substance Use Topics  . Alcohol use: No   Objective:   BP (!) 148/100 (BP Location: Left Arm, Patient Position: Sitting, Cuff Size: Large)   Pulse 92   Temp 98.4 F (36.9 C)   Resp 16   Wt 289 lb (131.1 kg)   SpO2 96%   BMI 49.61 kg/m  Vitals:   08/04/17 1602  BP: (!) 148/100  Pulse: 92  Resp: 16  Temp: 98.4 F (36.9 C)  SpO2: 96%  Weight: 289 lb (131.1 kg)  Physical Exam  Constitutional: She appears well-developed and well-nourished. No distress.  Neck: Normal range of motion. Neck supple. No JVD present. No tracheal deviation present. No thyromegaly present.  Cardiovascular: Normal rate, regular rhythm and normal heart sounds. Exam reveals no gallop and no friction rub.  No murmur heard. Pulmonary/Chest: Effort normal and breath sounds normal. No respiratory distress. She has no wheezes. She has no rales.  Musculoskeletal: She exhibits no edema.  Lymphadenopathy:    She has no cervical adenopathy.  Skin: She is not diaphoretic.  Vitals reviewed.      Assessment & Plan:     1.  Essential hypertension Referral placed to cardiology for further evaluation for palpitations and sinus tachycardia. Will increase metoprolol to 50mg  XR as below. Once she runs out of the old Rx if BP and HR are better we will refill at 50mg . If still not to goal will increase to 75mg . Patient in agreement. I will see her back in 4 weeks for recheck of BP and HR.  - Ambulatory referral to Cardiology - metoprolol succinate (TOPROL-XL) 25 MG 24 hr tablet; Take 2 tablets (50 mg total) by mouth daily.  Dispense: 30 tablet; Refill: 3  2. Sinus tachycardia Felt to be source of palpitations. No previous cardiac eval. No holter monitors or stress testing. Referral placed for further cardiology evaluation. Consult appreciated.   3. Palpitations See above medical treatment plan. - Ambulatory referral to Cardiology  4. Morbid obesity (HCC) Counseled patient on healthy lifestyle modifications including dieting and exercise.  - Ambulatory referral to Cardiology  5. Leukocytosis, unspecified type Will recheck labs to make sure improving since sinus infection is resolving. Will f/u pending results.  - CBC w/Diff/Platelet       Margaretann LovelessJennifer M Lewi Drost, PA-C  [facility]

## 2017-08-04 NOTE — Patient Instructions (Signed)
Sinus Tachycardia Sinus tachycardia is a kind of fast heartbeat. In sinus tachycardia, the heart beats more than 100 times a minute. Sinus tachycardia starts in a part of the heart called the sinus node. Sinus tachycardia may be harmless, or it may be a sign of a serious condition. What are the causes? This condition may be caused by:  Exercise or exertion.  A fever.  Pain.  Loss of body fluids (dehydration).  Severe bleeding (hemorrhage).  Anxiety and stress.  Certain substances, including: ? Alcohol. ? Caffeine. ? Tobacco and nicotine products. ? Diet pills. ? Illegal drugs.  Medical conditions including: ? Heart disease. ? An infection. ? An overactive thyroid (hyperthyroidism). ? A lack of red blood cells (anemia).  What are the signs or symptoms? Symptoms of this condition include:  A feeling that the heart is beating quickly (palpitations).  Suddenly noticing your heartbeat (cardiac awareness).  Dizziness.  Tiredness (fatigue).  Shortness of breath.  Chest pain.  Nausea.  Fainting.  How is this diagnosed? This condition is diagnosed with:  A physical exam.  Other tests, such as: ? Blood tests. ? An electrocardiogram (ECG). This test measures the electrical activity of the heart. ? Holter monitoring. For this test, you wear a device that records your heartbeat for one or more days.  You may be referred to a heart specialist (cardiologist). How is this treated? Treatment for this condition depends on the cause or underlying condition. Treatment may involve:  Treating the underlying condition.  Taking new medicines or changing your current medicines as told by your health care provider.  Making changes to your diet or lifestyle.  Practicing relaxation methods.  Follow these instructions at home: Lifestyle  Do not use any products that contain nicotine or tobacco, such as cigarettes and e-cigarettes. If you need help quitting, ask your  health care provider.  Learn relaxation methods, like deep breathing, to help you when you get stressed or anxious.  Do not use illegal drugs, such as cocaine.  Do not abuse alcohol. Limit alcohol intake to no more than 1 drink a day for non-pregnant women and 2 drinks a day for men. One drink equals 12 oz of beer, 5 oz of wine, or 1 oz of hard liquor.  Find time to rest and relax often. This reduces stress.  Avoid: ? Caffeine. ? Stimulants such as over-the-counter diet pills or pills that help you to stay awake. ? Situations that cause anxiety or stress. General instructions  Drink enough fluids to keep your urine clear or pale yellow.  Take over-the-counter and prescription medicines only as told by your health care provider.  Keep all follow-up visits as told by your health care provider. This is important. Contact a health care provider if:  You have a fever.  You have vomiting or diarrhea that keeps happening (is persistent). Get help right away if:  You have pain in your chest, upper arms, jaw, or neck.  You become weak or dizzy.  You feel faint.  You have palpitations that do not go away. This information is not intended to replace advice given to you by your health care provider. Make sure you discuss any questions you have with your health care provider. Document Released: 07/01/2004 Document Revised: 12/20/2015 Document Reviewed: 12/06/2014 Elsevier Interactive Patient Education  2018 [employer] Palpitations A palpitation is the feeling that your heart:  Has an uneven (irregular) heartbeat.  Is beating faster than normal.  Is fluttering.  Is skipping a beat.  This is usually not a serious problem. In some cases, you may need more medical tests. Follow these instructions at home:  Avoid: ? Caffeine in coffee, tea, soft drinks, diet pills, and energy drinks. ? Chocolate. ? Alcohol.  Do not use any tobacco products. These include cigarettes,  chewing tobacco, and e-cigarettes. If you need help quitting, ask your doctor.  Try to reduce your stress. These things may help: ? Yoga. ? Meditation. ? Physical activity. Swimming, jogging, and walking are good choices. ? A method that helps you use your mind to control things in your body, like heartbeats (biofeedback).  Get plenty of rest and sleep.  Take over-the-counter and prescription medicines only as told by your doctor.  Keep all follow-up visits as told by your doctor. This is important. Contact a doctor if:  Your heartbeat is still fast or uneven after 24 hours.  Your palpitations occur more often. Get help right away if:  You have chest pain.  You feel short of breath.  You have a very bad headache.  You feel dizzy.  You pass out (faint). This information is not intended to replace advice given to you by your health care provider. Make sure you discuss any questions you have with your health care provider. Document Released: 03/02/2008 Document Revised: 10/30/2015 Document Reviewed: 02/06/2015 Elsevier Interactive Patient Education  [employer].

## 2017-08-05 ENCOUNTER — Telehealth: Payer: Self-pay

## 2017-08-05 LAB — CBC WITH DIFFERENTIAL/PLATELET
BASOS: 0 %
Basophils Absolute: 0 10*3/uL (ref 0.0–0.2)
EOS (ABSOLUTE): 0 10*3/uL (ref 0.0–0.4)
EOS: 0 %
HEMATOCRIT: 46.6 % (ref 34.0–46.6)
HEMOGLOBIN: 15.7 g/dL (ref 11.1–15.9)
IMMATURE GRANULOCYTES: 0 %
Immature Grans (Abs): 0 10*3/uL (ref 0.0–0.1)
LYMPHS ABS: 3 10*3/uL (ref 0.7–3.1)
Lymphs: 27 %
MCH: 30.4 pg (ref 26.6–33.0)
MCHC: 33.7 g/dL (ref 31.5–35.7)
MCV: 90 fL (ref 79–97)
MONOCYTES: 9 %
Monocytes Absolute: 1 10*3/uL — ABNORMAL HIGH (ref 0.1–0.9)
Neutrophils Absolute: 7.1 10*3/uL — ABNORMAL HIGH (ref 1.4–7.0)
Neutrophils: 64 %
Platelets: 284 10*3/uL (ref 150–379)
RBC: 5.16 x10E6/uL (ref 3.77–5.28)
RDW: 13.5 % (ref 12.3–15.4)
WBC: 11.1 10*3/uL — AB (ref 3.4–10.8)

## 2017-08-05 NOTE — Telephone Encounter (Signed)
Patient advised as directed below. Per patient she is coming into see you in 4 weeks for BP follow up.  Thanks,  -Joseline

## 2017-08-05 NOTE — Telephone Encounter (Signed)
-----   Message from Margaretann LovelessJennifer M Burnette, PA-C sent at 08/05/2017  9:30 AM EST ----- WBC count is coming down as expected. We can recheck in 4 weeks to make sure it has returned to normal if desired.

## 2017-08-08 ENCOUNTER — Ambulatory Visit: Payer: 59 | Admitting: Physician Assistant

## 2017-08-10 ENCOUNTER — Ambulatory Visit: Payer: 59 | Admitting: Physician Assistant

## 2017-08-10 DIAGNOSIS — I10 Essential (primary) hypertension: Secondary | ICD-10-CM | POA: Insufficient documentation

## 2017-08-10 DIAGNOSIS — E782 Mixed hyperlipidemia: Secondary | ICD-10-CM | POA: Diagnosis not present

## 2017-08-10 DIAGNOSIS — R002 Palpitations: Secondary | ICD-10-CM | POA: Insufficient documentation

## 2017-08-10 DIAGNOSIS — R9431 Abnormal electrocardiogram [ECG] [EKG]: Secondary | ICD-10-CM | POA: Insufficient documentation

## 2017-08-15 ENCOUNTER — Other Ambulatory Visit: Payer: Self-pay | Admitting: Physician Assistant

## 2017-08-15 DIAGNOSIS — I10 Essential (primary) hypertension: Secondary | ICD-10-CM

## 2017-08-15 MED ORDER — METOPROLOL SUCCINATE ER 25 MG PO TB24: 50.0000 mg | ORAL_TABLET | Freq: Every day | ORAL | 1 refills | Status: DC

## 2017-08-15 NOTE — Telephone Encounter (Signed)
Patient needs RX for Metoprolol 50 mg. Sent to [facility] on [address].

## 2017-08-17 DIAGNOSIS — R002 Palpitations: Secondary | ICD-10-CM | POA: Diagnosis not present

## 2017-08-18 DIAGNOSIS — R002 Palpitations: Secondary | ICD-10-CM | POA: Diagnosis not present

## 2017-08-18 DIAGNOSIS — I10 Essential (primary) hypertension: Secondary | ICD-10-CM | POA: Diagnosis not present

## 2017-08-18 DIAGNOSIS — R9431 Abnormal electrocardiogram [ECG] [EKG]: Secondary | ICD-10-CM | POA: Diagnosis not present

## 2017-08-19 ENCOUNTER — Telehealth: Payer: Self-pay | Admitting: Physician Assistant

## 2017-08-19 NOTE — Telephone Encounter (Signed)
Please Review

## 2017-08-19 NOTE — Telephone Encounter (Signed)
Patient states that she had to wear a holter monitor and where one of the leads were placed on her chest she has a softball size red raised rash and it pulled off the skin a little bit when they took the lead off.  She states that she put cortisone cream on it and now it is about the size of a softball.  She states that she went to the cardiologist yesterday and showed him and he said she did not need to put anything on it.  She is wanting to know if you suggest putting anything on it.

## 2017-08-19 NOTE — Telephone Encounter (Signed)
Patient advised as directed below.  Thanks,  -Tristy Udovich 

## 2017-08-19 NOTE — Telephone Encounter (Signed)
Sounds like allergic reaction. May use benadryl cream on it. Warm compress can help also

## 2017-08-28 ENCOUNTER — Other Ambulatory Visit: Payer: Self-pay | Admitting: Physician Assistant

## 2017-08-28 DIAGNOSIS — M26609 Unspecified temporomandibular joint disorder, unspecified side: Secondary | ICD-10-CM

## 2017-09-01 ENCOUNTER — Ambulatory Visit (INDEPENDENT_AMBULATORY_CARE_PROVIDER_SITE_OTHER): Payer: 59 | Admitting: Physician Assistant

## 2017-09-01 ENCOUNTER — Encounter: Payer: Self-pay | Admitting: Physician Assistant

## 2017-09-01 VITALS — BP 120/72 | HR 72 | Temp 98.4°F | Resp 16 | Ht 63.0 in | Wt 282.4 lb

## 2017-09-01 DIAGNOSIS — I10 Essential (primary) hypertension: Secondary | ICD-10-CM | POA: Diagnosis not present

## 2017-09-01 NOTE — Patient Instructions (Signed)
DASH Eating Plan DASH stands for "Dietary Approaches to Stop Hypertension." The DASH eating plan is a healthy eating plan that has been shown to reduce high blood pressure (hypertension). It may also reduce your risk for type 2 diabetes, heart disease, and stroke. The DASH eating plan may also help with weight loss. What are tips for following this plan? General guidelines  Avoid eating more than 2,300 mg (milligrams) of salt (sodium) a day. If you have hypertension, you may need to reduce your sodium intake to 1,500 mg a day.  Limit alcohol intake to no more than 1 drink a day for nonpregnant women and 2 drinks a day for men. One drink equals 12 oz of beer, 5 oz of wine, or 1 oz of hard liquor.  Work with your health care provider to maintain a healthy body weight or to lose weight. Ask what an ideal weight is for you.  Get at least 30 minutes of exercise that causes your heart to beat faster (aerobic exercise) most days of the week. Activities may include walking, swimming, or biking.  Work with your health care provider or diet and nutrition specialist (dietitian) to adjust your eating plan to your individual calorie needs. Reading food labels  Check food labels for the amount of sodium per serving. Choose foods with less than 5 percent of the Daily Value of sodium. Generally, foods with less than 300 mg of sodium per serving fit into this eating plan.  To find whole grains, look for the word "whole" as the first word in the ingredient list. Shopping  Buy products labeled as "low-sodium" or "no salt added."  Buy fresh foods. Avoid canned foods and premade or frozen meals. Cooking  Avoid adding salt when cooking. Use salt-free seasonings or herbs instead of table salt or sea salt. Check with your health care provider or pharmacist before using salt substitutes.  Do not fry foods. Cook foods using healthy methods such as baking, boiling, grilling, and broiling instead.  Cook with  heart-healthy oils, such as olive, canola, soybean, or sunflower oil. Meal planning   Eat a balanced diet that includes: ? 5 or more servings of fruits and vegetables each day. At each meal, try to fill half of your plate with fruits and vegetables. ? Up to 6-8 servings of whole grains each day. ? Less than 6 oz of lean meat, poultry, or fish each day. A 3-oz serving of meat is about the same size as a deck of cards. One egg equals 1 oz. ? 2 servings of low-fat dairy each day. ? A serving of nuts, seeds, or beans 5 times each week. ? Heart-healthy fats. Healthy fats called Omega-3 fatty acids are found in foods such as flaxseeds and coldwater fish, like sardines, salmon, and mackerel.  Limit how much you eat of the following: ? Canned or prepackaged foods. ? Food that is high in trans fat, such as fried foods. ? Food that is high in saturated fat, such as fatty meat. ? Sweets, desserts, sugary drinks, and other foods with added sugar. ? Full-fat dairy products.  Do not salt foods before eating.  Try to eat at least 2 vegetarian meals each week.  Eat more home-cooked food and less restaurant, buffet, and fast food.  When eating at a restaurant, ask that your food be prepared with less salt or no salt, if possible. What foods are recommended? The items listed may not be a complete list. Talk with your dietitian about what   dietary choices are best for you. Grains Whole-grain or whole-wheat bread. Whole-grain or whole-wheat pasta. Brown rice. Oatmeal. Quinoa. Bulgur. Whole-grain and low-sodium cereals. Pita bread. Low-fat, low-sodium crackers. Whole-wheat flour tortillas. Vegetables Fresh or frozen vegetables (raw, steamed, roasted, or grilled). Low-sodium or reduced-sodium tomato and vegetable juice. Low-sodium or reduced-sodium tomato sauce and tomato paste. Low-sodium or reduced-sodium canned vegetables. Fruits All fresh, dried, or frozen fruit. Canned fruit in natural juice (without  added sugar). Meat and other protein foods Skinless chicken or turkey. Ground chicken or turkey. Pork with fat trimmed off. Fish and seafood. Egg whites. Dried beans, peas, or lentils. Unsalted nuts, nut butters, and seeds. Unsalted canned beans. Lean cuts of beef with fat trimmed off. Low-sodium, lean deli meat. Dairy Low-fat (1%) or fat-free (skim) milk. Fat-free, low-fat, or reduced-fat cheeses. Nonfat, low-sodium ricotta or cottage cheese. Low-fat or nonfat yogurt. Low-fat, low-sodium cheese. Fats and oils Soft margarine without trans fats. Vegetable oil. Low-fat, reduced-fat, or light mayonnaise and salad dressings (reduced-sodium). Canola, safflower, olive, soybean, and sunflower oils. Avocado. Seasoning and other foods Herbs. Spices. Seasoning mixes without salt. Unsalted popcorn and pretzels. Fat-free sweets. What foods are not recommended? The items listed may not be a complete list. Talk with your dietitian about what dietary choices are best for you. Grains Baked goods made with fat, such as croissants, muffins, or some breads. Dry pasta or rice meal packs. Vegetables Creamed or fried vegetables. Vegetables in a cheese sauce. Regular canned vegetables (not low-sodium or reduced-sodium). Regular canned tomato sauce and paste (not low-sodium or reduced-sodium). Regular tomato and vegetable juice (not low-sodium or reduced-sodium). Pickles. Olives. Fruits Canned fruit in a light or heavy syrup. Fried fruit. Fruit in cream or butter sauce. Meat and other protein foods Fatty cuts of meat. Ribs. Fried meat. Bacon. Sausage. Bologna and other processed lunch meats. Salami. Fatback. Hotdogs. Bratwurst. Salted nuts and seeds. Canned beans with added salt. Canned or smoked fish. Whole eggs or egg yolks. Chicken or turkey with skin. Dairy Whole or 2% milk, cream, and half-and-half. Whole or full-fat cream cheese. Whole-fat or sweetened yogurt. Full-fat cheese. Nondairy creamers. Whipped toppings.  Processed cheese and cheese spreads. Fats and oils Butter. Stick margarine. Lard. Shortening. Ghee. Bacon fat. Tropical oils, such as coconut, palm kernel, or palm oil. Seasoning and other foods Salted popcorn and pretzels. Onion salt, garlic salt, seasoned salt, table salt, and sea salt. Worcestershire sauce. Tartar sauce. Barbecue sauce. Teriyaki sauce. Soy sauce, including reduced-sodium. Steak sauce. Canned and packaged gravies. Fish sauce. Oyster sauce. Cocktail sauce. Horseradish that you find on the shelf. Ketchup. Mustard. Meat flavorings and tenderizers. Bouillon cubes. Hot sauce and Tabasco sauce. Premade or packaged marinades. Premade or packaged taco seasonings. Relishes. Regular salad dressings. Where to find more information:  National Heart, Lung, and Blood Institute: www.nhlbi.nih.gov  American Heart Association: www.heart.org Summary  The DASH eating plan is a healthy eating plan that has been shown to reduce high blood pressure (hypertension). It may also reduce your risk for type 2 diabetes, heart disease, and stroke.  With the DASH eating plan, you should limit salt (sodium) intake to 2,300 mg a day. If you have hypertension, you may need to reduce your sodium intake to 1,500 mg a day.  When on the DASH eating plan, aim to eat more fresh fruits and vegetables, whole grains, lean proteins, low-fat dairy, and heart-healthy fats.  Work with your health care provider or diet and nutrition specialist (dietitian) to adjust your eating plan to your individual   calorie needs. This information is not intended to replace advice given to you by your health care provider. Make sure you discuss any questions you have with your health care provider. Document Released: 05/13/2011 Document Revised: 05/17/2016 Document Reviewed: 05/17/2016 Elsevier Interactive Patient Education  2018 Elsevier Inc.  

## 2017-09-01 NOTE — Progress Notes (Signed)
Patient: Mandy Baldwin Female    DOB: 1960-12-03   57 y.o.   MRN: [id number] Visit Date: 09/01/2017  Today's Provider: Margaretann Loveless, PA-C   Chief Complaint  Patient presents with  . Hypertension   Subjective:    HPI  Hypertension, follow-up:  BP Readings from Last 3 Encounters:  09/01/17 120/72  08/04/17 (!) 148/100  07/29/17 (!) 170/104    She was last seen for hypertension 1 months ago.  BP at that visit was 148/100. Management changes since that visit include increase Metoprolol to 50 mg daily. She reports excellent compliance with treatment. Patient reports taking MAXZIDE 37.5-2.5 MG daily. Patient reports that medication has been changing vendors and reports that medication caused her to be nauseated. She is not having side effects.  She is exercising. She is adherent to low salt diet.   Outside blood pressures are stable 120's/70's . She is experiencing palpitations.  Patient denies chest pain.   Cardiovascular risk factors include hypertension and obesity (BMI >= 30 kg/m2).  Use of agents associated with hypertension: none.    Weight trend: stable Wt Readings from Last 3 Encounters:  09/01/17 282 lb 6.4 oz (128.1 kg)  08/04/17 289 lb (131.1 kg)  07/29/17 296 lb 12.8 oz (134.6 kg)   Current diet: in general, a "healthy" diet     ------------------------------------------------------------------------  Patient is due to have CBC checked, last lab done on 08/05/17 WBC elevated.    Allergies  Allergen Reactions  . Tape Rash    Some bandaids cause rash     Current Outpatient Medications:  .  aspirin (ASPIRIN LOW DOSE) 81 MG tablet, Take 81 mg by mouth daily., Disp: , Rfl:  .  atorvastatin (LIPITOR) 10 MG tablet, Take 1 tablet (10 mg total) daily by mouth., Disp: 90 tablet, Rfl: 1 .  cholecalciferol (VITAMIN D) 1000 UNITS tablet, Take 2,000 Units by mouth daily., Disp: , Rfl:  .  etodolac (LODINE) 400 MG tablet, TAKE 1 TABLET BY MOUTH  TWICE DAILY AS NEEDED, Disp: 90 tablet, Rfl: 1 .  fluticasone (FLONASE) 50 MCG/ACT nasal spray, Place 2 sprays daily into both nostrils., Disp: 48 g, Rfl: 1 .  magnesium oxide (MAG-OX) 400 MG tablet, Take 400 mg by mouth daily., Disp: , Rfl:  .  metoprolol succinate (TOPROL-XL) 25 MG 24 hr tablet, Take 2 tablets (50 mg total) by mouth daily., Disp: 180 tablet, Rfl: 1 .  Multiple Vitamins-Minerals (MULTIVITAL PO), Take 1 tablet by mouth daily., Disp: , Rfl:  .  ondansetron (ZOFRAN ODT) 4 MG disintegrating tablet, Take 1 tablet (4 mg total) by mouth every 8 (eight) hours as needed for nausea or vomiting., Disp: 20 tablet, Rfl: 0 .  pantoprazole (PROTONIX) 40 MG tablet, Take 1 tablet (40 mg total) by mouth daily., Disp: 30 tablet, Rfl: 11 .  promethazine (PHENERGAN) 12.5 MG tablet, Take 1 tablet (12.5 mg total) by mouth every 8 (eight) hours as needed for nausea or vomiting., Disp: 20 tablet, Rfl: 0 .  triamterene-hydrochlorothiazide (MAXZIDE-25) 37.5-25 MG tablet, Take 1 tablet by mouth daily., Disp: 90 tablet, Rfl: 3  Review of Systems  Constitutional: Negative.   Respiratory: Negative.   Cardiovascular: Positive for palpitations (last night; much less frequent than previous).  Gastrointestinal: Negative.   Neurological: Negative.     Social History   Tobacco Use  . Smoking status: Never Smoker  . Smokeless tobacco: Never Used  Substance Use Topics  . Alcohol use: No  Objective:   BP 120/72 (BP Location: Left Arm, Patient Position: Sitting, Cuff Size: Large)   Pulse 72   Temp 98.4 F (36.9 C) (Oral)   Resp 16   Ht 5\' 3"  (1.6 m)   Wt 282 lb 6.4 oz (128.1 kg)   SpO2 97%   BMI 50.02 kg/m    Physical Exam  Constitutional: She appears well-developed and well-nourished. No distress.  Neck: Normal range of motion. Neck supple. No JVD present. No tracheal deviation present. No thyromegaly present.  Cardiovascular: Normal rate, regular rhythm and normal heart sounds. Exam reveals no  gallop and no friction rub.  No murmur heard. Pulmonary/Chest: Effort normal and breath sounds normal. No respiratory distress. She has no wheezes. She has no rales.  Lymphadenopathy:    She has no cervical adenopathy.  Skin: She is not diaphoretic.  Vitals reviewed.       Assessment & Plan:     1. Essential hypertension Improved. Continue Maxzide 37.5-12.5mg   and Metoprolol 50mg . Will check labs as below and f/u pending results.  - CBC with Differential/Platelet - Basic Metabolic Panel (BMET)       Margaretann LovelessJennifer M Burnette, PA-C  [facility]

## 2017-09-02 ENCOUNTER — Telehealth: Payer: Self-pay

## 2017-09-02 ENCOUNTER — Encounter: Payer: Self-pay | Admitting: Physician Assistant

## 2017-09-02 LAB — CBC WITH DIFFERENTIAL/PLATELET
Basophils Absolute: 0 10*3/uL (ref 0.0–0.2)
Basos: 0 %
EOS (ABSOLUTE): 0 10*3/uL (ref 0.0–0.4)
EOS: 0 %
HEMATOCRIT: 44 % (ref 34.0–46.6)
Hemoglobin: 15.2 g/dL (ref 11.1–15.9)
IMMATURE GRANS (ABS): 0 10*3/uL (ref 0.0–0.1)
Immature Granulocytes: 0 %
LYMPHS ABS: 2.6 10*3/uL (ref 0.7–3.1)
LYMPHS: 29 %
MCH: 30.4 pg (ref 26.6–33.0)
MCHC: 34.5 g/dL (ref 31.5–35.7)
MCV: 88 fL (ref 79–97)
MONOCYTES: 10 %
Monocytes Absolute: 0.9 10*3/uL (ref 0.1–0.9)
NEUTROS ABS: 5.5 10*3/uL (ref 1.4–7.0)
Neutrophils: 61 %
Platelets: 242 10*3/uL (ref 150–379)
RBC: 5 x10E6/uL (ref 3.77–5.28)
RDW: 13.5 % (ref 12.3–15.4)
WBC: 8.9 10*3/uL (ref 3.4–10.8)

## 2017-09-02 LAB — BASIC METABOLIC PANEL
BUN/Creatinine Ratio: 35 — ABNORMAL HIGH (ref 9–23)
BUN: 22 mg/dL (ref 6–24)
CO2: 26 mmol/L (ref 20–29)
CREATININE: 0.63 mg/dL (ref 0.57–1.00)
Calcium: 9.7 mg/dL (ref 8.7–10.2)
Chloride: 99 mmol/L (ref 96–106)
GFR calc Af Amer: 118 mL/min/{1.73_m2} (ref >59)
GFR, EST NON AFRICAN AMERICAN: 102 mL/min/{1.73_m2} (ref >59)
GLUCOSE: 91 mg/dL (ref 65–99)
Potassium: 4 mmol/L (ref 3.5–5.2)
SODIUM: 139 mmol/L (ref 134–144)

## 2017-09-02 NOTE — Telephone Encounter (Signed)
-----   Message from Margaretann LovelessJennifer M Burnette, PA-C sent at 09/02/2017  8:32 AM EDT ----- Labs are now normal.

## 2017-09-02 NOTE — Telephone Encounter (Signed)
lmtcb

## 2017-09-02 NOTE — Telephone Encounter (Signed)
Patient advised as directed below.  Thanks,  -Jennfer Gassen 

## 2017-09-16 ENCOUNTER — Other Ambulatory Visit: Payer: Self-pay | Admitting: Physician Assistant

## 2017-09-16 DIAGNOSIS — I10 Essential (primary) hypertension: Secondary | ICD-10-CM

## 2017-09-19 ENCOUNTER — Encounter: Payer: Self-pay | Admitting: Physician Assistant

## 2017-09-25 ENCOUNTER — Other Ambulatory Visit: Payer: Self-pay | Admitting: Gastroenterology

## 2017-09-28 ENCOUNTER — Encounter: Payer: Self-pay | Admitting: Physician Assistant

## 2017-09-28 ENCOUNTER — Telehealth: Payer: Self-pay | Admitting: Physician Assistant

## 2017-09-28 ENCOUNTER — Other Ambulatory Visit: Payer: Self-pay | Admitting: *Deleted

## 2017-09-28 MED ORDER — PANTOPRAZOLE SODIUM 40 MG PO TBEC: 40.0000 mg | DELAYED_RELEASE_TABLET | Freq: Every day | ORAL | 11 refills | Status: DC

## 2017-09-28 NOTE — Telephone Encounter (Signed)
[facility] pharmacy faxed a refill request for the following medication. Thanks CC  pantoprazole (PROTONIX) 40 MG tablet

## 2017-10-06 ENCOUNTER — Other Ambulatory Visit: Payer: Self-pay | Admitting: Physician Assistant

## 2017-10-06 DIAGNOSIS — E7800 Pure hypercholesterolemia, unspecified: Secondary | ICD-10-CM

## 2017-12-01 ENCOUNTER — Other Ambulatory Visit: Payer: Self-pay | Admitting: Physician Assistant

## 2017-12-01 DIAGNOSIS — M26609 Unspecified temporomandibular joint disorder, unspecified side: Secondary | ICD-10-CM

## 2018-01-03 ENCOUNTER — Other Ambulatory Visit: Payer: Self-pay | Admitting: Physician Assistant

## 2018-01-03 ENCOUNTER — Ambulatory Visit (INDEPENDENT_AMBULATORY_CARE_PROVIDER_SITE_OTHER): Payer: 59 | Admitting: Physician Assistant

## 2018-01-03 ENCOUNTER — Encounter: Payer: Self-pay | Admitting: Physician Assistant

## 2018-01-03 VITALS — BP 138/94 | HR 88 | Temp 97.5°F | Resp 16 | Wt 285.0 lb

## 2018-01-03 DIAGNOSIS — M797 Fibromyalgia: Secondary | ICD-10-CM

## 2018-01-03 DIAGNOSIS — A084 Viral intestinal infection, unspecified: Secondary | ICD-10-CM | POA: Diagnosis not present

## 2018-01-03 NOTE — Patient Instructions (Signed)
Myofascial Pain Syndrome and Fibromyalgia Myofascial pain syndrome and fibromyalgia are both pain disorders. This pain may be felt mainly in your muscles.  Myofascial pain syndrome: ? Always has trigger points or tender points in the muscle that will cause pain when pressed. The pain may come and go. ? Usually affects your neck, upper back, and shoulder areas. The pain often radiates into your arms and hands.  Fibromyalgia: ? Has muscle pains and tenderness that come and go. ? Is often associated with fatigue and sleep disturbances. ? Has trigger points. ? Tends to be long-lasting (chronic), but is not life-threatening.  Fibromyalgia and myofascial pain are not the same. However, they often occur together. If you have both conditions, each can make the other worse. Both are common and can cause enough pain and fatigue to make day-to-day activities difficult. What are the causes? The exact causes of fibromyalgia and myofascial pain are not known. People with certain gene types may be more likely to develop fibromyalgia. Some factors can be triggers for both conditions, such as:  Spine disorders.  Arthritis.  Severe injury (trauma) and other physical stressors.  Being under a lot of stress.  A medical illness.  What are the signs or symptoms? Fibromyalgia The main symptom of fibromyalgia is widespread pain and tenderness in your muscles. This can vary over time. Pain is sometimes described as stabbing, shooting, or burning. You may have tingling or numbness, too. You may also have sleep problems and fatigue. You may wake up feeling tired and groggy (fibro fog). Other symptoms may include:  Bowel and bladder problems.  Headaches.  Visual problems.  Problems with odors and noises.  Depression or mood changes.  Painful menstrual periods (dysmenorrhea).  Dry skin or eyes.  Myofascial pain syndrome Symptoms of myofascial pain syndrome include:  Tight, ropy bands of  muscle.  Uncomfortable sensations in muscular areas, such as: ? Aching. ? Cramping. ? Burning. ? Numbness. ? Tingling. ? Muscle weakness.  Trouble moving certain muscles freely (range of motion).  How is this diagnosed? There are no specific tests to diagnose fibromyalgia or myofascial pain syndrome. Both can be hard to diagnose because their symptoms are common in many other conditions. Your health care provider may suspect one or both of these conditions based on your symptoms and medical history. Your health care provider will also do a physical exam. The key to diagnosing fibromyalgia is having pain, fatigue, and other symptoms for more than three months that cannot be explained by another condition. The key to diagnosing myofascial pain syndrome is finding trigger points in muscles that are tender and cause pain elsewhere in your body (referred pain). How is this treated? Treating fibromyalgia and myofascial pain often requires a team of health care providers. This usually starts with your primary provider and a physical therapist. You may also find it helpful to work with alternative health care providers, such as massage therapists or acupuncturists. Treatment for fibromyalgia may include medicines. This may include nonsteroidal anti-inflammatory drugs (NSAIDs), along with other medicines. Treatment for myofascial pain may also include:  NSAIDs.  Cooling and stretching of muscles.  Trigger point injections.  Sound wave (ultrasound) treatments to stimulate muscles.  Follow these instructions at home:  Take medicines only as directed by your health care provider.  Exercise as directed by your health care provider or physical therapist.  Try to avoid stressful situations.  Practice relaxation techniques to control your stress. You may want to try: ? Biofeedback. ? Visual   imagery. ? Hypnosis. ? Muscle relaxation. ? Yoga. ? Meditation.  Talk to your health care provider  about alternative treatments, such as acupuncture or massage treatment.  Maintain a healthy lifestyle. This includes eating a healthy diet and getting enough sleep.  Consider joining a support group.  Do not do activities that stress or strain your muscles. That includes repetitive motions and heavy lifting. Where to find more information:  National Fibromyalgia Association: www.fmaware.org  Arthritis Foundation: www.arthritis.org  American Chronic Pain Association: www.theacpa.org/condition/myofascial-pain Contact a health care provider if:  You have new symptoms.  Your symptoms get worse.  You have side effects from your medicines.  You have trouble sleeping.  Your condition is causing depression or anxiety. This information is not intended to replace advice given to you by your health care provider. Make sure you discuss any questions you have with your health care provider. Document Released: 05/24/2005 Document Revised: 10/30/2015 Document Reviewed: 02/27/2014 Elsevier Interactive Patient Education  2018 Elsevier Inc.  

## 2018-01-03 NOTE — Progress Notes (Signed)
Patient: Mandy Baldwin Female    DOB: May 12, 1961   57 y.o.   MRN: [id number] Visit Date: 01/03/2018  Today's Provider: Margaretann Loveless, PA-C   I, Joslyn Hy, CMA, am acting as scribe for [employer], PA-C.  Chief Complaint  Patient presents with  . Emesis   Subjective:    Emesis   This is a new problem. The current episode started yesterday. Episode frequency: 5 times. Progression since onset: still nauseous, no vomiting today. There has been no fever. Associated symptoms include chills, headaches, myalgias and sweats. Pertinent negatives include no abdominal pain, chest pain, coughing, diarrhea, dizziness or fever. Associated symptoms comments: Pt states she noticed a sharp, shooting pain in the right side of her neck. Treatments tried: Promethazine. The treatment provided moderate relief.      Allergies  Allergen Reactions  . Tape Rash    Some bandaids cause rash     Current Outpatient Medications:  .  aspirin (ASPIRIN LOW DOSE) 81 MG tablet, Take 81 mg by mouth daily., Disp: , Rfl:  .  atorvastatin (LIPITOR) 10 MG tablet, TAKE 1 TABLET BY MOUTH ONCE DAILY, Disp: 90 tablet, Rfl: 1 .  cholecalciferol (VITAMIN D) 1000 UNITS tablet, Take 2,000 Units by mouth daily., Disp: , Rfl:  .  etodolac (LODINE) 400 MG tablet, TAKE 1 TABLET BY MOUTH TWICE DAILY AS NEEDED, Disp: 180 tablet, Rfl: 1 .  famotidine (PEPCID) 20 MG tablet, Take 20 mg by mouth at bedtime., Disp: , Rfl:  .  fluticasone (FLONASE) 50 MCG/ACT nasal spray, Place 2 sprays daily into both nostrils., Disp: 48 g, Rfl: 1 .  magnesium oxide (MAG-OX) 400 MG tablet, Take 400 mg by mouth daily., Disp: , Rfl:  .  metoprolol succinate (TOPROL-XL) 25 MG 24 hr tablet, Take 2 tablets (50 mg total) by mouth daily., Disp: 180 tablet, Rfl: 1 .  Multiple Vitamins-Minerals (MULTIVITAL PO), Take 1 tablet by mouth daily., Disp: , Rfl:  .  pantoprazole (PROTONIX) 40 MG tablet, Take 1 tablet (40 mg total) by mouth  daily., Disp: 30 tablet, Rfl: 11 .  promethazine (PHENERGAN) 12.5 MG tablet, TAKE 1 TABLET BY MOUTH EVERY 8 HOURS AS NEEDED FOR NAUSEA AND VOMITING, Disp: 20 tablet, Rfl: 0 .  triamterene-hydrochlorothiazide (MAXZIDE-25) 37.5-25 MG tablet, TAKE 1 TABLET BY MOUTH ONCE DAILY, Disp: 90 tablet, Rfl: 3  Review of Systems  Constitutional: Positive for chills. Negative for fever.  Respiratory: Negative for cough.   Cardiovascular: Negative for chest pain.  Gastrointestinal: Positive for vomiting. Negative for abdominal pain and diarrhea.  Musculoskeletal: Positive for myalgias and neck pain.  Neurological: Positive for headaches. Negative for dizziness.    Social History   Tobacco Use  . Smoking status: Never Smoker  . Smokeless tobacco: Never Used  Substance Use Topics  . Alcohol use: No   Objective:   BP (!) 138/94 (BP Location: Left Arm, Patient Position: Sitting, Cuff Size: Large)   Pulse 88   Temp (!) 97.5 F (36.4 C) (Oral)   Resp 16   Wt 285 lb (129.3 kg)   SpO2 96%   BMI 50.49 kg/m  Vitals:   01/03/18 1322  BP: (!) 138/94  Pulse: 88  Resp: 16  Temp: (!) 97.5 F (36.4 C)  TempSrc: Oral  SpO2: 96%  Weight: 285 lb (129.3 kg)     Physical Exam  Constitutional: She is oriented to person, place, and time. She appears well-developed and well-nourished. No distress.  Cardiovascular:  Normal rate, regular rhythm and normal heart sounds. Exam reveals no gallop and no friction rub.  No murmur heard. Pulmonary/Chest: Effort normal and breath sounds normal. No respiratory distress. She has no wheezes. She has no rales.  Abdominal: Soft. Normal appearance and bowel sounds are normal. She exhibits no distension and no mass. There is no hepatosplenomegaly. There is no tenderness. There is no rebound, no guarding and no CVA tenderness.  Musculoskeletal:       Cervical back: She exhibits tenderness. She exhibits normal range of motion, no bony tenderness, no pain, no spasm and normal  pulse.       Thoracic back: Normal.  Neurological: She is alert and oriented to person, place, and time.  Skin: Skin is warm and dry. She is not diaphoretic.       Assessment & Plan:     1. Viral gastroenteritis Suspect viral gastroenteritis vs GERD (patient had large meal of creamy chicken pasta and laid down within 30-60 minutes of eating). Continue conservative treatment with phenergan prn, pushing fluids, bland diet and increase diet as tolerated. Call if no improvements.   2. Fibromyalgia Patient has had general tenderness and sensitivity to even light touch across the upper back, neck and shoulders. Reports it has been that way for over 10 years. Suspect fibromyalgia as cause. Patient declines medications at this time, feeling she is coping well. She does report she can do massage therapy and that helps it. Also discussed slowly increasing physical activity. Call if symptoms worsen or change.

## 2018-01-05 ENCOUNTER — Encounter: Payer: Self-pay | Admitting: Physician Assistant

## 2018-01-13 ENCOUNTER — Encounter: Payer: Self-pay | Admitting: Physician Assistant

## 2018-01-27 ENCOUNTER — Encounter: Payer: Self-pay | Admitting: Physician Assistant

## 2018-01-27 DIAGNOSIS — K219 Gastro-esophageal reflux disease without esophagitis: Secondary | ICD-10-CM

## 2018-01-27 MED ORDER — FAMOTIDINE 20 MG PO TABS: 20.0000 mg | ORAL_TABLET | Freq: Every day | ORAL | 1 refills | Status: DC

## 2018-02-01 ENCOUNTER — Encounter: Payer: Self-pay | Admitting: Physician Assistant

## 2018-02-01 DIAGNOSIS — R21 Rash and other nonspecific skin eruption: Secondary | ICD-10-CM

## 2018-02-01 MED ORDER — TRIAMCINOLONE ACETONIDE 0.1 % EX CREA: 1.0000 "application " | TOPICAL_CREAM | Freq: Two times a day (BID) | CUTANEOUS | 0 refills | Status: DC

## 2018-02-02 ENCOUNTER — Other Ambulatory Visit: Payer: Self-pay | Admitting: Physician Assistant

## 2018-02-02 DIAGNOSIS — I10 Essential (primary) hypertension: Secondary | ICD-10-CM

## 2018-04-08 ENCOUNTER — Other Ambulatory Visit: Payer: Self-pay | Admitting: Physician Assistant

## 2018-04-08 DIAGNOSIS — E7800 Pure hypercholesterolemia, unspecified: Secondary | ICD-10-CM

## 2018-04-10 ENCOUNTER — Ambulatory Visit (INDEPENDENT_AMBULATORY_CARE_PROVIDER_SITE_OTHER): Payer: 59 | Admitting: Physician Assistant

## 2018-04-10 ENCOUNTER — Encounter: Payer: Self-pay | Admitting: Physician Assistant

## 2018-04-10 VITALS — BP 122/80 | HR 85 | Temp 97.6°F | Resp 16 | Wt 285.0 lb

## 2018-04-10 DIAGNOSIS — M545 Low back pain, unspecified: Secondary | ICD-10-CM

## 2018-04-10 DIAGNOSIS — M62838 Other muscle spasm: Secondary | ICD-10-CM

## 2018-04-10 LAB — POCT URINALYSIS DIPSTICK
GLUCOSE UA: NEGATIVE
Ketones, UA: NEGATIVE
LEUKOCYTES UA: NEGATIVE
NITRITE UA: NEGATIVE
PH UA: 6 (ref 5.0–8.0)
PROTEIN UA: NEGATIVE
RBC UA: NEGATIVE
SPEC GRAV UA: 1.02 (ref 1.010–1.025)
Urobilinogen, UA: 0.2 E.U./dL

## 2018-04-10 MED ORDER — METHYLPREDNISOLONE 4 MG PO TBPK: ORAL_TABLET | ORAL | 0 refills | Status: DC

## 2018-04-10 MED ORDER — CYCLOBENZAPRINE HCL 5 MG PO TABS: 5.0000 mg | ORAL_TABLET | Freq: Three times a day (TID) | ORAL | 1 refills | Status: DC | PRN

## 2018-04-10 NOTE — Patient Instructions (Addendum)
Muscle Cramps and Spasms Muscle cramps and spasms are when muscles tighten by themselves. They usually get better within minutes. Muscle cramps are painful. They are usually stronger and last longer than muscle spasms. Muscle spasms may or may not be painful. They can last a few seconds or much longer. Follow these instructions at home:  Drink enough fluid to keep your pee (urine) clear or pale yellow.  Massage, stretch, and relax the muscle.  If directed, apply heat to tight or tense muscles as often as told by your doctor. Use the heat source that your doctor recommends. ? Place a towel between your skin and the heat source. ? Leave the heat on for 20-30 minutes. ? Take off the heat if your skin turns bright red. This is especially important if you are unable to feel pain, heat, or cold. You may have a greater risk of getting burned.  If directed, put ice on the affected area. This may help if you are sore or have pain after a cramp or spasm. ? Put ice in a plastic bag. ? Place a towel between your skin and the bag. ? Leave the ice on for 20 minutes, 2-3 times a day.  Take over-the-counter and prescription medicines only as told by your doctor.  Pay attention to any changes in your symptoms. Contact a doctor if:  Your cramps or spasms get worse or happen more often.  Your cramps or spasms do not get better with time. This information is not intended to replace advice given to you by your health care provider. Make sure you discuss any questions you have with your health care provider. Document Released: 05/06/2008 Document Revised: 06/25/2015 Document Reviewed: 02/25/2015 Elsevier Interactive Patient Education  2018 [employer]   Sciatica Sciatica is pain, numbness, weakness, or tingling along the path of the sciatic nerve. The sciatic nerve starts in the lower back and runs down the back of each leg. The nerve controls the muscles in the lower leg and in the back of the knee. It  also provides feeling (sensation) to the back of the thigh, the lower leg, and the sole of the foot. Sciatica is a symptom of another medical condition that pinches or puts pressure on the sciatic nerve. Generally, sciatica only affects one side of the body. Sciatica usually goes away on its own or with treatment. In some cases, sciatica may keep coming back (recur). What are the causes? This condition is caused by pressure on the sciatic nerve, or pinching of the sciatic nerve. This may be the result of: A disk in between the bones of the spine (vertebrae) bulging out too far (herniated disk). Age-related changes in the spinal disks (degenerative disk disease). A pain disorder that affects a muscle in the buttock (piriformis syndrome). Extra bone growth (bone spur) near the sciatic nerve. An injury or break (fracture) of the pelvis. Pregnancy. Tumor (rare).  What increases the risk? The following factors may make you more likely to develop this condition: Playing sports that place pressure or stress on the spine, such as football or weight lifting. Having poor strength and flexibility. A history of back injury. A history of back surgery. Sitting for long periods of time. Doing activities that involve repetitive bending or lifting. Obesity.  What are the signs or symptoms? Symptoms can vary from mild to very severe, and they may include: Any of these problems in the lower back, leg, hip, or buttock: Mild tingling or dull aches. Burning sensations. Lambert Mody  pains. Numbness in the back of the calf or the sole of the foot. Leg weakness. Severe back pain that makes movement difficult.  These symptoms may get worse when you cough, sneeze, or laugh, or when you sit or stand for long periods of time. Being overweight may also make symptoms worse. In some cases, symptoms may recur over time. How is this diagnosed? This condition may be diagnosed based on: Your symptoms. A physical exam. Your  health care provider may ask you to do certain movements to check whether those movements trigger your symptoms. You may have tests, including: Blood tests. X-rays. MRI. CT scan.  How is this treated? In many cases, this condition improves on its own, without any treatment. However, treatment may include: Reducing or modifying physical activity during periods of pain. Exercising and stretching to strengthen your abdomen and improve the flexibility of your spine. Icing and applying heat to the affected area. Medicines that help: To relieve pain and swelling. To relax your muscles. Injections of medicines that help to relieve pain, irritation, and inflammation around the sciatic nerve (steroids). Surgery.  Follow these instructions at home: Medicines Take over-the-counter and prescription medicines only as told by your health care provider. Do not drive or operate heavy machinery while taking prescription pain medicine. Managing pain If directed, apply ice to the affected area. Put ice in a plastic bag. Place a towel between your skin and the bag. Leave the ice on for 20 minutes, 2-3 times a day. After icing, apply heat to the affected area before you exercise or as often as told by your health care provider. Use the heat source that your health care provider recommends, such as a moist heat pack or a heating pad. Place a towel between your skin and the heat source. Leave the heat on for 20-30 minutes. Remove the heat if your skin turns bright red. This is especially important if you are unable to feel pain, heat, or cold. You may have a greater risk of getting burned. Activity Return to your normal activities as told by your health care provider. Ask your health care provider what activities are safe for you. Avoid activities that make your symptoms worse. Take brief periods of rest throughout the day. Resting in a lying or standing position is usually better than sitting to  rest. When you rest for longer periods, mix in some mild activity or stretching between periods of rest. This will help to prevent stiffness and pain. Avoid sitting for long periods of time without moving. Get up and move around at least one time each hour. Exercise and stretch regularly, as told by your health care provider. Do not lift anything that is heavier than 10 lb (4.5 kg) while you have symptoms of sciatica. When you do not have symptoms, you should still avoid heavy lifting, especially repetitive heavy lifting. When you lift objects, always use proper lifting technique, which includes: Bending your knees. Keeping the load close to your body. Avoiding twisting. General instructions Use good posture. Avoid leaning forward while sitting. Avoid hunching over while standing. Maintain a healthy weight. Excess weight puts extra stress on your back and makes it difficult to maintain good posture. Wear supportive, comfortable shoes. Avoid wearing high heels. Avoid sleeping on a mattress that is too soft or too hard. A mattress that is firm enough to support your back when you sleep may help to reduce your pain. Keep all follow-up visits as told by your health care provider. This  is important. Contact a health care provider if: You have pain that wakes you up when you are sleeping. You have pain that gets worse when you lie down. Your pain is worse than you have experienced in the past. Your pain lasts longer than 4 weeks. You experience unexplained weight loss. Get help right away if: You lose control of your bowel or bladder (incontinence). You have: Weakness in your lower back, pelvis, buttocks, or legs that gets worse. Redness or swelling of your back. A burning sensation when you urinate. This information is not intended to replace advice given to you by your health care provider. Make sure you discuss any questions you have with your health care provider. Document Released:  05/18/2001 Document Revised: 10/28/2015 Document Reviewed: 01/31/2015 Elsevier Interactive Patient Education  [employer].

## 2018-04-10 NOTE — Progress Notes (Signed)
Patient: Mandy Baldwin Female    DOB: Dec 13, 1960   57 y.o.   MRN: [id number] Visit Date: 04/10/2018  Today's Provider: Margaretann Loveless, PA-C   Chief Complaint  Patient presents with  . Flank Pain   Subjective:    Flank Pain  This is a new problem. The current episode started yesterday. The problem has been gradually worsening since onset. The pain is present in the lumbar spine (Right side). The quality of the pain is described as shooting, stabbing and aching. The pain does not radiate. The pain is mild. Associated symptoms include headaches ("a little") and numbness. Pertinent negatives include no bladder incontinence, bowel incontinence, chest pain, dysuria, fever, leg pain, pelvic pain or tingling. She has tried muscle relaxant (muscle biofreeze) for the symptoms. The treatment provided no relief.      Allergies  Allergen Reactions  . Tape Rash    Some bandaids cause rash     Current Outpatient Medications:  .  aspirin (ASPIRIN LOW DOSE) 81 MG tablet, Take 81 mg by mouth daily., Disp: , Rfl:  .  atorvastatin (LIPITOR) 10 MG tablet, TAKE 1 TABLET BY MOUTH ONCE DAILY, Disp: 90 tablet, Rfl: 1 .  cholecalciferol (VITAMIN D) 1000 UNITS tablet, Take 2,000 Units by mouth daily., Disp: , Rfl:  .  etodolac (LODINE) 400 MG tablet, TAKE 1 TABLET BY MOUTH TWICE DAILY AS NEEDED, Disp: 180 tablet, Rfl: 1 .  famotidine (PEPCID) 20 MG tablet, Take 1 tablet (20 mg total) by mouth at bedtime., Disp: 90 tablet, Rfl: 1 .  fluticasone (FLONASE) 50 MCG/ACT nasal spray, Place 2 sprays daily into both nostrils., Disp: 48 g, Rfl: 1 .  magnesium oxide (MAG-OX) 400 MG tablet, Take 400 mg by mouth daily., Disp: , Rfl:  .  metoprolol succinate (TOPROL-XL) 25 MG 24 hr tablet, TAKE 2 TABLETS BY MOUTH DAILY, Disp: 180 tablet, Rfl: 1 .  Multiple Vitamins-Minerals (MULTIVITAL PO), Take 1 tablet by mouth daily., Disp: , Rfl:  .  pantoprazole (PROTONIX) 40 MG tablet, Take 1 tablet (40 mg total)  by mouth daily., Disp: 30 tablet, Rfl: 11 .  promethazine (PHENERGAN) 12.5 MG tablet, TAKE 1 TABLET BY MOUTH EVERY 8 HOURS AS NEEDED FOR NAUSEA AND VOMITING, Disp: 20 tablet, Rfl: 0 .  triamcinolone cream (KENALOG) 0.1 %, Apply 1 application topically 2 (two) times daily., Disp: 30 g, Rfl: 0 .  triamterene-hydrochlorothiazide (MAXZIDE-25) 37.5-25 MG tablet, TAKE 1 TABLET BY MOUTH ONCE DAILY, Disp: 90 tablet, Rfl: 3  Review of Systems  Constitutional: Negative for fever.  Cardiovascular: Negative for chest pain, palpitations and leg swelling.  Gastrointestinal: Negative for bowel incontinence.  Endocrine: Negative for polyuria.  Genitourinary: Positive for flank pain. Negative for bladder incontinence, dysuria, frequency, hematuria, pelvic pain and vaginal pain.  Neurological: Positive for numbness and headaches ("a little"). Negative for tingling.    Social History   Tobacco Use  . Smoking status: Never Smoker  . Smokeless tobacco: Never Used  Substance Use Topics  . Alcohol use: No   Objective:   BP 122/80 (BP Location: Left Arm, Patient Position: Sitting, Cuff Size: Large)   Pulse 85   Temp 97.6 F (36.4 C) (Oral)   Resp 16   Wt 285 lb (129.3 kg)   BMI 50.49 kg/m  Vitals:   04/10/18 1728  BP: 122/80  Pulse: 85  Resp: 16  Temp: 97.6 F (36.4 C)  TempSrc: Oral  Weight: 285 lb (129.3 kg)  Physical Exam  Constitutional: She appears well-developed and well-nourished. No distress.  Neck: Normal range of motion. Neck supple.  Cardiovascular: Normal rate, regular rhythm and normal heart sounds. Exam reveals no gallop and no friction rub.  No murmur heard. Pulmonary/Chest: Effort normal and breath sounds normal. No respiratory distress. She has no wheezes. She has no rales.  Musculoskeletal:       Lumbar back: She exhibits decreased range of motion, tenderness, pain and spasm. She exhibits no bony tenderness.       Back:  Skin: She is not diaphoretic.  Vitals  reviewed.       Assessment & Plan:     1. Right low back pain, unspecified chronicity, unspecified whether sciatica present UA was normal today. Will treat muscle spasm and inflammation with muscle relaxer and steroid taper as noted below. Advised to use heat and start light stretches in a few days once spasm starts to release. She is to call if symptoms fail to improve.  - POCT urinalysis dipstick - methylPREDNISolone (MEDROL) 4 MG TBPK tablet; 6 day taper as directed on package instructions  Dispense: 21 tablet; Refill: 0 - cyclobenzaprine (FLEXERIL) 5 MG tablet; Take 1 tablet (5 mg total) by mouth 3 (three) times daily as needed for muscle spasms.  Dispense: 30 tablet; Refill: 1  2. Muscle spasm See above medical treatment plan. - methylPREDNISolone (MEDROL) 4 MG TBPK tablet; 6 day taper as directed on package instructions  Dispense: 21 tablet; Refill: 0 - cyclobenzaprine (FLEXERIL) 5 MG tablet; Take 1 tablet (5 mg total) by mouth 3 (three) times daily as needed for muscle spasms.  Dispense: 30 tablet; Refill: 1       Margaretann Loveless, PA-C  [facility]

## 2018-04-14 ENCOUNTER — Other Ambulatory Visit: Payer: Self-pay | Admitting: Physician Assistant

## 2018-04-14 DIAGNOSIS — Z1231 Encounter for screening mammogram for malignant neoplasm of breast: Secondary | ICD-10-CM

## 2018-04-21 ENCOUNTER — Encounter: Payer: 59 | Admitting: Physician Assistant

## 2018-04-27 ENCOUNTER — Ambulatory Visit
Admission: RE | Admit: 2018-04-27 | Discharge: 2018-04-27 | Disposition: A | Discharge status: Home / Self Care | Payer: 59 | Source: Ambulatory Visit | Attending: Physician Assistant | Admitting: Physician Assistant

## 2018-04-27 DIAGNOSIS — Z1231 Encounter for screening mammogram for malignant neoplasm of breast: Secondary | ICD-10-CM | POA: Diagnosis not present

## 2018-04-28 ENCOUNTER — Telehealth: Payer: Self-pay

## 2018-04-28 NOTE — Telephone Encounter (Signed)
-----   Message from Margaretann LovelessJennifer M Burnette, PA-C sent at 04/28/2018  8:20 AM EST ----- Normal mammogram. Repeat screening in one year.

## 2018-04-28 NOTE — Telephone Encounter (Signed)
Patient was advised.  

## 2018-04-28 NOTE — Telephone Encounter (Signed)
LVM[facility] 

## 2018-05-03 ENCOUNTER — Encounter: Payer: Self-pay | Admitting: Physician Assistant

## 2018-05-03 ENCOUNTER — Ambulatory Visit (INDEPENDENT_AMBULATORY_CARE_PROVIDER_SITE_OTHER): Payer: 59 | Admitting: Physician Assistant

## 2018-05-03 ENCOUNTER — Other Ambulatory Visit (HOSPITAL_COMMUNITY)
Admission: RE | Admit: 2018-05-03 | Discharge: 2018-05-03 | Disposition: A | Discharge status: Home / Self Care | Payer: 59 | Source: Ambulatory Visit | Attending: Physician Assistant | Admitting: Physician Assistant

## 2018-05-03 VITALS — BP 140/90 | HR 75 | Temp 98.0°F | Resp 16 | Ht 63.0 in | Wt 280.0 lb

## 2018-05-03 DIAGNOSIS — Z124 Encounter for screening for malignant neoplasm of cervix: Secondary | ICD-10-CM | POA: Diagnosis present

## 2018-05-03 DIAGNOSIS — Z0000 Encounter for general adult medical examination without abnormal findings: Secondary | ICD-10-CM

## 2018-05-03 DIAGNOSIS — I10 Essential (primary) hypertension: Secondary | ICD-10-CM

## 2018-05-03 DIAGNOSIS — R7309 Other abnormal glucose: Secondary | ICD-10-CM

## 2018-05-03 DIAGNOSIS — E7800 Pure hypercholesterolemia, unspecified: Secondary | ICD-10-CM | POA: Diagnosis not present

## 2018-05-03 DIAGNOSIS — Z6841 Body Mass Index (BMI) 40.0 and over, adult: Secondary | ICD-10-CM

## 2018-05-03 DIAGNOSIS — E559 Vitamin D deficiency, unspecified: Secondary | ICD-10-CM

## 2018-05-03 DIAGNOSIS — Z23 Encounter for immunization: Secondary | ICD-10-CM | POA: Diagnosis not present

## 2018-05-03 NOTE — Progress Notes (Signed)
Patient: Mandy Baldwin, Female    DOB: [birth year], 57 y.o.   MRN: [id number] Visit Date: 05/03/2018  Today's Provider: Mar Daring, PA-C   Chief Complaint  Patient presents with  . Annual Exam   Subjective:    Annual physical exam Mandy Baldwin is a 57 y.o. female who presents today for health maintenance and complete physical. She feels well. She reports exercising some. She reports she is sleeping fairly well.  -----------------------------------------------------------------  Per patient she had a sudden episode yesterday evening after work. She reports that she was talking with one of her coworkers and started to feel flush on her face and feeling dizzy. She reports that when she got home she took her blood pressure and it was 150/92 Pulse:101 at 6:10 pm.She reports that she is feeling better today with some sinus pressure only. She does report not drinking a lot yesterday like she normally does.  Per patient she saw her Cardiologist Dr. Nehemiah Massed on 08/18/17 and everything was normal.  Review of Systems  Constitutional: Negative.   HENT: Positive for rhinorrhea and sinus pressure. Negative for congestion, ear pain, nosebleeds, postnasal drip, sinus pain, sneezing and sore throat.   Eyes: Negative.   Respiratory: Negative.   Cardiovascular: Negative.   Gastrointestinal: Negative.   Endocrine: Negative.   Genitourinary: Negative.   Musculoskeletal: Negative.   Skin: Negative.   Allergic/Immunologic: Negative.   Neurological: Positive for light-headedness (yesterday; no other times). Negative for dizziness, weakness, numbness and headaches.  Hematological: Negative.   Psychiatric/Behavioral: Positive for sleep disturbance (trouble falling asleep).    Social History      She  reports that she has never smoked. She has never used smokeless tobacco. She reports that she does not drink alcohol or use drugs.       Social History   Socioeconomic  History  . Marital status: Married    Spouse name: Kaena Santori  . Number of children: 0  . Years of education: 62  . Highest education level: Not on file  Occupational History  . Occupation: Therapist, art    Comment: Research scientist (medical): [employer]  Social Needs  . Financial resource strain: Not on file  . Food insecurity:    Worry: Not on file    Inability: Not on file  . Transportation needs:    Medical: Not on file    Non-medical: Not on file  Tobacco Use  . Smoking status: Never Smoker  . Smokeless tobacco: Never Used  Substance and Sexual Activity  . Alcohol use: No  . Drug use: No  . Sexual activity: Not on file  Lifestyle  . Physical activity:    Days per week: 0 days    Minutes per session: Not on file  . Stress: Not on file  Relationships  . Social connections:    Talks on phone: Not on file    Gets together: Not on file    Attends religious service: Not on file    Active member of club or organization: Not on file    Attends meetings of clubs or organizations: Not on file    Relationship status: Not on file  Other Topics Concern  . Not on file  Social History Narrative  . Not on file    Past Medical History:  Diagnosis Date  . GERD (gastroesophageal reflux disease)   . Hyperlipidemia   . Hypertension   . [place]  . Osteoarthritis of both knees   . Shingles 12/14/2014  . TMJ (dislocation of temporomandibular joint)      Patient Active Problem List   Diagnosis Date Noted  . Fibromyalgia 01/03/2018  . Abnormal ECG 08/10/2017  . Benign essential HTN 08/10/2017  . Palpitations 08/10/2017  . History of shingles 04/14/2017  . Family history of abdominal aortic aneurysm (AAA) 04/14/2017  . Hypercholesterolemia 04/14/2017  . Heartburn   . Stricture and stenosis of esophagus   . Gastritis   . Mass of right breast on mammogram 07/16/2015  . GERD (gastroesophageal reflux disease) 07/14/2015  . Hypertension  07/14/2015  . Internal hemorrhoid, bleeding 06/27/2015  . 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 BMI 50.0-59.9, adult ([place]) 02/07/2015  . Gravida 0 02/07/2015  . Abnormal glucose 02/07/2015  . Morbid obesity ([place]) 12/14/2014  . Irregular menses 12/14/2014  . Vitamin D deficiency 12/14/2014  . Ocular migraine 12/14/2014  . Osteoarthritis of right knee 12/14/2014    Past Surgical History:  Procedure Laterality Date  . COLONOSCOPY    . ESOPHAGOGASTRODUODENOSCOPY (EGD) WITH PROPOFOL N/A 09/18/2015   Procedure: ESOPHAGOGASTRODUODENOSCOPY (EGD) WITH PROPOFOL with ballon dilatation;  Surgeon: Lucilla Lame, MD;  Location: [facility];  Service: Endoscopy;  Laterality: N/A;  . LAPAROSCOPIC OVARIAN      Family History        Family Status  Relation Name Status  . Mother  Deceased  . Father  Deceased  . Sister  Deceased  . Brother  Alive  . Sister  Alive  . Sister  Alive  . Brother  Alive        Her family history includes Aortic aneurysm in her mother; Breast cancer (age of onset: 64) in her sister; COPD in her brother and father; Dementia in her mother.      Allergies  Allergen Reactions  . Tape Rash    Some bandaids cause rash     Current Outpatient Medications:  .  aspirin (ASPIRIN LOW DOSE) 81 MG tablet, Take 81 mg by mouth daily., Disp: , Rfl:  .  atorvastatin (LIPITOR) 10 MG tablet, TAKE 1 TABLET BY MOUTH ONCE DAILY, Disp: 90 tablet, Rfl: 1 .  cholecalciferol (VITAMIN D) 1000 UNITS tablet, Take 2,000 Units by mouth daily., Disp: , Rfl:  .  cyclobenzaprine (FLEXERIL) 5 MG tablet, Take 1 tablet (5 mg total) by mouth 3 (three) times daily as needed for muscle spasms., Disp: 30 tablet, Rfl: 1 .  etodolac (LODINE) 400 MG tablet, TAKE 1 TABLET BY MOUTH TWICE DAILY AS NEEDED, Disp: 180 tablet, Rfl: 1 .  famotidine (PEPCID) 20 MG tablet, Take 1 tablet (20 mg total) by mouth at bedtime., Disp: 90 tablet, Rfl: 1 .  fluticasone (FLONASE) 50 MCG/ACT nasal spray,  Place 2 sprays daily into both nostrils., Disp: 48 g, Rfl: 1 .  magnesium oxide (MAG-OX) 400 MG tablet, Take 400 mg by mouth daily., Disp: , Rfl:  .  metoprolol succinate (TOPROL-XL) 25 MG 24 hr tablet, TAKE 2 TABLETS BY MOUTH DAILY, Disp: 180 tablet, Rfl: 1 .  Multiple Vitamins-Minerals (MULTIVITAL PO), Take 1 tablet by mouth daily., Disp: , Rfl:  .  pantoprazole (PROTONIX) 40 MG tablet, Take 1 tablet (40 mg total) by mouth daily., Disp: 30 tablet, Rfl: 11 .  promethazine (PHENERGAN) 12.5 MG tablet, TAKE 1 TABLET BY MOUTH EVERY 8 HOURS AS NEEDED FOR NAUSEA AND VOMITING, Disp: 20 tablet, Rfl: 0 .  triamcinolone cream (KENALOG) 0.1 %, Apply  1 application topically 2 (two) times daily., Disp: 30 g, Rfl: 0 .  triamterene-hydrochlorothiazide (MAXZIDE-25) 37.5-25 MG tablet, TAKE 1 TABLET BY MOUTH ONCE DAILY, Disp: 90 tablet, Rfl: 3   Patient Care Team: Mar Daring, PA-C as PCP - General (Family Medicine)      Objective:   Vitals: BP 140/90 (BP Location: Left Arm, Patient Position: Sitting, Cuff Size: Large)   Pulse 75   Temp 98 F (36.7 C) (Oral)   Resp 16   Ht 5' 3"  (1.6 m)   Wt 280 lb (127 kg)   SpO2 98%   BMI 49.60 kg/m    Vitals:   05/03/18 0810  BP: 140/90  Pulse: 75  Resp: 16  Temp: 98 F (36.7 C)  TempSrc: Oral  SpO2: 98%  Weight: 280 lb (127 kg)  Height: 5' 3"  (1.6 m)     Physical Exam  Constitutional: She is oriented to person, place, and time. She appears well-developed and well-nourished. No distress.  HENT:  Head: Normocephalic and atraumatic.  Right Ear: Hearing, tympanic membrane, external ear and ear canal normal.  Left Ear: Hearing, tympanic membrane, external ear and ear canal normal.  Nose: Nose normal.  Mouth/Throat: Uvula is midline, oropharynx is clear and moist and mucous membranes are normal. No oropharyngeal exudate.  Eyes: Pupils are equal, round, and reactive to light. Conjunctivae and EOM are normal. Right eye exhibits no discharge. Left  eye exhibits no discharge. No scleral icterus.  Neck: Normal range of motion. Neck supple. No JVD present. Carotid bruit is not present. No tracheal deviation present. No thyromegaly present.  Cardiovascular: Normal rate, regular rhythm, normal heart sounds and intact distal pulses. Exam reveals no gallop and no friction rub.  No murmur heard. Pulmonary/Chest: Effort normal and breath sounds normal. No respiratory distress. She has no wheezes. She has no rales. She exhibits no tenderness.  Abdominal: Soft. Bowel sounds are normal. She exhibits no distension and no mass. There is no tenderness. There is no rebound and no guarding. Hernia confirmed negative in the right inguinal area and confirmed negative in the left inguinal area.  Genitourinary: Rectum normal, vagina normal and uterus normal. No breast tenderness, discharge or bleeding. Pelvic exam was performed with patient supine. There is no rash, tenderness, lesion or injury on the right labia. There is no rash, tenderness, lesion or injury on the left labia. Cervix exhibits no motion tenderness, no discharge and no friability. Right adnexum displays no mass, no tenderness and no fullness. Left adnexum displays no mass, no tenderness and no fullness. No erythema, tenderness or bleeding in the vagina. No signs of injury around the vagina. No vaginal discharge found.  Musculoskeletal: Normal range of motion. She exhibits no edema or tenderness.  Lymphadenopathy:    She has no cervical adenopathy.       Right: No inguinal adenopathy present.       Left: No inguinal adenopathy present.  Neurological: She is alert and oriented to person, place, and time. She has normal reflexes. No cranial nerve deficit. Coordination normal.  Skin: Skin is warm and dry. No rash noted. She is not diaphoretic.  Psychiatric: She has a normal mood and affect. Her behavior is normal. Judgment and thought content normal.  Vitals reviewed.    Depression Screen PHQ 2/9  Scores 05/03/2018 04/14/2017 03/12/2016 02/07/2015  PHQ - 2 Score 0 0 0 0  PHQ- 9 Score - 1 - -      Assessment & Plan:  Routine Health Maintenance and Physical Exam  Exercise Activities and Dietary recommendations Goals    . Reduce portion size       Immunization History  Administered Date(s) Administered  . MMR 12/24/1998  . Td 03/04/2005  . Tdap 04/14/2017    Health Maintenance  Topic Date Due  . PAP SMEAR  01/18/2017  . INFLUENZA VACCINE  01/05/2018  . MAMMOGRAM  04/27/2020  . COLONOSCOPY  04/12/2024  . TETANUS/TDAP  04/15/2027  . Hepatitis C Screening  Completed  . HIV Screening  Discontinued     Discussed health benefits of physical activity, and encouraged her to engage in regular exercise appropriate for her age and condition.    1. Annual physical exam Normal physical exam today. Will check labs as below and f/u pending lab results. If labs are stable and WNL she will not need to have these rechecked for one year at her next annual physical exam. She is to call the office in the meantime if she has any acute issue, questions or concerns. - CBC with Differential/Platelet - Comprehensive metabolic panel - Hemoglobin A1c - Lipid panel - TSH  2. Cervical cancer screening Pap collected today. Will send as below and f/u pending results. - Cytology - PAP  3. Hypercholesterolemia Stable. Continue atorvastatin 26m. Will check labs as below and f/u pending results. - CBC with Differential/Platelet - Comprehensive metabolic panel - Hemoglobin A1c - Lipid panel - TSH  4. Benign essential HTN Stable. Continue metoprolol 220mXR and maxzide 37.5-25mg. Will check labs as below and f/u pending results. - CBC with Differential/Platelet - Comprehensive metabolic panel - Hemoglobin A1c - Lipid panel - TSH  5. Abnormal glucose Diet controlled. Will check labs as below and f/u pending results. - CBC with Differential/Platelet - Comprehensive metabolic  panel - Hemoglobin A1c - Lipid panel - TSH  6. Class 3 severe obesity due to excess calories with serious comorbidity and body mass index (BMI) of 45.0 to 49.9 in adult [facility] healthy lifestyle modifications including dieting and exercise.  - CBC with Differential/Platelet - Comprehensive metabolic panel - Hemoglobin A1c - Lipid panel - TSH  7. Vitamin D deficiency H/O this. Not currently on treatment. Will check labs as below and f/u pending results. - CBC with Differential/Platelet - Comprehensive metabolic panel - Vitamin D (25 hydroxy)  8. Need for influenza vaccination Flu vaccine given today without complication. Patient sat upright for 15 minutes to check for adverse reaction before being released. - Flu Vaccine QUAD 36+ mos IM  --------------------------------------------------------------------    JeMar DaringPA-C  [employer]

## 2018-05-03 NOTE — Patient Instructions (Signed)
Health Maintenance for Postmenopausal Women Menopause is a normal process in which your reproductive ability comes to an end. This process happens gradually over a span of months to years, usually between the ages of 22 and 9. Menopause is complete when you have missed 12 consecutive menstrual periods. It is important to talk with your health care provider about some of the most common conditions that affect postmenopausal women, such as heart disease, cancer, and bone loss (osteoporosis). Adopting a healthy lifestyle and getting preventive care can help to promote your health and wellness. Those actions can also lower your chances of developing some of these common conditions. What should I know about menopause? During menopause, you may experience a number of symptoms, such as:  Moderate-to-severe hot flashes.  Night sweats.  Decrease in sex drive.  Mood swings.  Headaches.  Tiredness.  Irritability.  Memory problems.  Insomnia.  Choosing to treat or not to treat menopausal changes is an individual decision that you make with your health care provider. What should I know about hormone replacement therapy and supplements? Hormone therapy products are effective for treating symptoms that are associated with menopause, such as hot flashes and night sweats. Hormone replacement carries certain risks, especially as you become older. If you are thinking about using estrogen or estrogen with progestin treatments, discuss the benefits and risks with your health care provider. What should I know about heart disease and stroke? Heart disease, heart attack, and stroke become more likely as you age. This may be due, in part, to the hormonal changes that your body experiences during menopause. These can affect how your body processes dietary fats, triglycerides, and cholesterol. Heart attack and stroke are both medical emergencies. There are many things that you can do to help prevent heart disease  and stroke:  Have your blood pressure checked at least every 1-2 years. High blood pressure causes heart disease and increases the risk of stroke.  If you are 53-22 years old, ask your health care provider if you should take aspirin to prevent a heart attack or a stroke.  Do not use any tobacco products, including cigarettes, chewing tobacco, or electronic cigarettes. If you need help quitting, ask your health care provider.  It is important to eat a healthy diet and maintain a healthy weight. ? Be sure to include plenty of vegetables, fruits, low-fat dairy products, and lean protein. ? Avoid eating foods that are high in solid fats, added sugars, or salt (sodium).  Get regular exercise. This is one of the most important things that you can do for your health. ? Try to exercise for at least 150 minutes each week. The type of exercise that you do should increase your heart rate and make you sweat. This is known as moderate-intensity exercise. ? Try to do strengthening exercises at least twice each week. Do these in addition to the moderate-intensity exercise.  Know your numbers.Ask your health care provider to check your cholesterol and your blood glucose. Continue to have your blood tested as directed by your health care provider.  What should I know about cancer screening? There are several types of cancer. Take the following steps to reduce your risk and to catch any cancer development as early as possible. Breast Cancer  Practice breast self-awareness. ? This means understanding how your breasts normally appear and feel. ? It also means doing regular breast self-exams. Let your health care provider know about any changes, no matter how small.  If you are 40  or older, have a clinician do a breast exam (clinical breast exam or CBE) every year. Depending on your age, family history, and medical history, it may be recommended that you also have a yearly breast X-ray (mammogram).  If you  have a family history of breast cancer, talk with your health care provider about genetic screening.  If you are at high risk for breast cancer, talk with your health care provider about having an MRI and a mammogram every year.  Breast cancer (BRCA) gene test is recommended for women who have family members with BRCA-related cancers. Results of the assessment will determine the need for genetic counseling and BRCA1 and for BRCA2 testing. BRCA-related cancers include these types: ? Breast. This occurs in males or females. ? Ovarian. ? Tubal. This may also be called fallopian tube cancer. ? Cancer of the abdominal or pelvic lining (peritoneal cancer). ? Prostate. ? Pancreatic.  Cervical, Uterine, and Ovarian Cancer Your health care provider may recommend that you be screened regularly for cancer of the pelvic organs. These include your ovaries, uterus, and vagina. This screening involves a pelvic exam, which includes checking for microscopic changes to the surface of your cervix (Pap test).  For women ages 21-65, health care providers may recommend a pelvic exam and a Pap test every three years. For women ages 79-65, they may recommend the Pap test and pelvic exam, combined with testing for human papilloma virus (HPV), every five years. Some types of HPV increase your risk of cervical cancer. Testing for HPV may also be done on women of any age who have unclear Pap test results.  Other health care providers may not recommend any screening for nonpregnant women who are considered low risk for pelvic cancer and have no symptoms. Ask your health care provider if a screening pelvic exam is right for you.  If you have had past treatment for cervical cancer or a condition that could lead to cancer, you need Pap tests and screening for cancer for at least 20 years after your treatment. If Pap tests have been discontinued for you, your risk factors (such as having a new sexual partner) need to be  reassessed to determine if you should start having screenings again. Some women have medical problems that increase the chance of getting cervical cancer. In these cases, your health care provider may recommend that you have screening and Pap tests more often.  If you have a family history of uterine cancer or ovarian cancer, talk with your health care provider about genetic screening.  If you have vaginal bleeding after reaching menopause, tell your health care provider.  There are currently no reliable tests available to screen for ovarian cancer.  Lung Cancer Lung cancer screening is recommended for adults 69-62 years old who are at high risk for lung cancer because of a history of smoking. A yearly low-dose CT scan of the lungs is recommended if you:  Currently smoke.  Have a history of at least 30 pack-years of smoking and you currently smoke or have quit within the past 15 years. A pack-year is smoking an average of one pack of cigarettes per day for one year.  Yearly screening should:  Continue until it has been 15 years since you quit.  Stop if you develop a health problem that would prevent you from having lung cancer treatment.  Colorectal Cancer  This type of cancer can be detected and can often be prevented.  Routine colorectal cancer screening usually begins at  age 42 and continues through age 45.  If you have risk factors for colon cancer, your health care provider may recommend that you be screened at an earlier age.  If you have a family history of colorectal cancer, talk with your health care provider about genetic screening.  Your health care provider may also recommend using home test kits to check for hidden blood in your stool.  A small camera at the end of a tube can be used to examine your colon directly (sigmoidoscopy or colonoscopy). This is done to check for the earliest forms of colorectal cancer.  Direct examination of the colon should be repeated every  5-10 years until age 71. However, if early forms of precancerous polyps or small growths are found or if you have a family history or genetic risk for colorectal cancer, you may need to be screened more often.  Skin Cancer  Check your skin from head to toe regularly.  Monitor any moles. Be sure to tell your health care provider: ? About any new moles or changes in moles, especially if there is a change in a mole's shape or color. ? If you have a mole that is larger than the size of a pencil eraser.  If any of your family members has a history of skin cancer, especially at a young age, talk with your health care provider about genetic screening.  Always use sunscreen. Apply sunscreen liberally and repeatedly throughout the day.  Whenever you are outside, protect yourself by wearing long sleeves, pants, a wide-brimmed hat, and sunglasses.  What should I know about osteoporosis? Osteoporosis is a condition in which bone destruction happens more quickly than new bone creation. After menopause, you may be at an increased risk for osteoporosis. To help prevent osteoporosis or the bone fractures that can happen because of osteoporosis, the following is recommended:  If you are 46-71 years old, get at least 1,000 mg of calcium and at least 600 mg of vitamin D per day.  If you are older than age 55 but younger than age 65, get at least 1,200 mg of calcium and at least 600 mg of vitamin D per day.  If you are older than age 54, get at least 1,200 mg of calcium and at least 800 mg of vitamin D per day.  Smoking and excessive alcohol intake increase the risk of osteoporosis. Eat foods that are rich in calcium and vitamin D, and do weight-bearing exercises several times each week as directed by your health care provider. What should I know about how menopause affects my mental health? Depression may occur at any age, but it is more common as you become older. Common symptoms of depression  include:  Low or sad mood.  Changes in sleep patterns.  Changes in appetite or eating patterns.  Feeling an overall lack of motivation or enjoyment of activities that you previously enjoyed.  Frequent crying spells.  Talk with your health care provider if you think that you are experiencing depression. What should I know about immunizations? It is important that you get and maintain your immunizations. These include:  Tetanus, diphtheria, and pertussis (Tdap) booster vaccine.  Influenza every year before the flu season begins.  Pneumonia vaccine.  Shingles vaccine.  Your health care provider may also recommend other immunizations. This information is not intended to replace advice given to you by your health care provider. Make sure you discuss any questions you have with your health care provider. Document Released: 07/16/2005  Document Revised: 12/12/2015 Document Reviewed: 02/25/2015 Elsevier Interactive Patient Education  2018 Elsevier Inc.  

## 2018-05-04 LAB — COMPREHENSIVE METABOLIC PANEL
ALBUMIN: 4.4 g/dL (ref 3.5–5.5)
ALK PHOS: 86 IU/L (ref 39–117)
ALT: 24 IU/L (ref 0–32)
AST: 23 IU/L (ref 0–40)
Albumin/Globulin Ratio: 1.6 (ref 1.2–2.2)
BUN / CREAT RATIO: 21 (ref 9–23)
BUN: 13 mg/dL (ref 6–24)
Bilirubin Total: 0.6 mg/dL (ref 0.0–1.2)
CHLORIDE: 101 mmol/L (ref 96–106)
CO2: 22 mmol/L (ref 20–29)
CREATININE: 0.62 mg/dL (ref 0.57–1.00)
Calcium: 9.6 mg/dL (ref 8.7–10.2)
GFR calc Af Amer: 117 mL/min/{1.73_m2} (ref >59)
GFR calc non Af Amer: 102 mL/min/{1.73_m2} (ref >59)
GLUCOSE: 106 mg/dL — AB (ref 65–99)
Globulin, Total: 2.8 g/dL (ref 1.5–4.5)
Potassium: 4 mmol/L (ref 3.5–5.2)
SODIUM: 142 mmol/L (ref 134–144)
Total Protein: 7.2 g/dL (ref 6.0–8.5)

## 2018-05-04 LAB — CBC WITH DIFFERENTIAL/PLATELET
BASOS ABS: 0 10*3/uL (ref 0.0–0.2)
Basos: 0 %
EOS (ABSOLUTE): 0.1 10*3/uL (ref 0.0–0.4)
Eos: 1 %
HEMOGLOBIN: 15.6 g/dL (ref 11.1–15.9)
Hematocrit: 45.8 % (ref 34.0–46.6)
Immature Grans (Abs): 0 10*3/uL (ref 0.0–0.1)
Immature Granulocytes: 0 %
LYMPHS ABS: 2 10*3/uL (ref 0.7–3.1)
Lymphs: 26 %
MCH: 30.2 pg (ref 26.6–33.0)
MCHC: 34.1 g/dL (ref 31.5–35.7)
MCV: 89 fL (ref 79–97)
MONOCYTES: 8 %
MONOS ABS: 0.6 10*3/uL (ref 0.1–0.9)
NEUTROS ABS: 4.9 10*3/uL (ref 1.4–7.0)
NEUTROS PCT: 65 %
Platelets: 255 10*3/uL (ref 150–450)
RBC: 5.17 x10E6/uL (ref 3.77–5.28)
RDW: 12.9 % (ref 12.3–15.4)
WBC: 7.7 10*3/uL (ref 3.4–10.8)

## 2018-05-04 LAB — LIPID PANEL
CHOL/HDL RATIO: 2.6 ratio (ref 0.0–4.4)
Cholesterol, Total: 166 mg/dL (ref 100–199)
HDL: 64 mg/dL (ref >39)
LDL Calculated: 85 mg/dL (ref 0–99)
TRIGLYCERIDES: 86 mg/dL (ref 0–149)
VLDL Cholesterol Cal: 17 mg/dL (ref 5–40)

## 2018-05-04 LAB — HEMOGLOBIN A1C
ESTIMATED AVERAGE GLUCOSE: 108 mg/dL
HEMOGLOBIN A1C: 5.4 % (ref 4.8–5.6)

## 2018-05-04 LAB — VITAMIN D 25 HYDROXY (VIT D DEFICIENCY, FRACTURES): Vit D, 25-Hydroxy: 55.3 ng/mL (ref 30.0–100.0)

## 2018-05-04 LAB — TSH: TSH: 0.873 u[IU]/mL (ref 0.450–4.500)

## 2018-05-05 ENCOUNTER — Encounter: Payer: Self-pay | Admitting: Physician Assistant

## 2018-05-08 ENCOUNTER — Telehealth: Payer: Self-pay

## 2018-05-08 LAB — CYTOLOGY - PAP
DIAGNOSIS: NEGATIVE
HPV: NOT DETECTED

## 2018-05-08 NOTE — Telephone Encounter (Signed)
Viewed by Delrae Sawyersose Marie Uselman on 05/05/2018 12:01 PM

## 2018-05-08 NOTE — Telephone Encounter (Signed)
-----   Message from Margaretann LovelessJennifer M Burnette, [place] JerseyPA-C sent at 05/08/2018 12:35 PM EST ----- Pap is normal, HPV negative.  Will repeat in 3-5 years.

## 2018-05-10 ENCOUNTER — Encounter: Payer: Self-pay | Admitting: Physician Assistant

## 2018-05-26 ENCOUNTER — Other Ambulatory Visit: Payer: Self-pay | Admitting: Physician Assistant

## 2018-05-26 DIAGNOSIS — M26609 Unspecified temporomandibular joint disorder, unspecified side: Secondary | ICD-10-CM

## 2018-06-13 ENCOUNTER — Other Ambulatory Visit: Payer: Self-pay | Admitting: Physician Assistant

## 2018-06-13 DIAGNOSIS — K219 Gastro-esophageal reflux disease without esophagitis: Secondary | ICD-10-CM

## 2018-07-04 ENCOUNTER — Other Ambulatory Visit: Payer: Self-pay | Admitting: Physician Assistant

## 2018-07-04 DIAGNOSIS — I10 Essential (primary) hypertension: Secondary | ICD-10-CM

## 2018-07-04 MED ORDER — METOPROLOL SUCCINATE ER 25 MG PO TB24: 50.0000 mg | ORAL_TABLET | Freq: Every day | ORAL | 1 refills | Status: DC

## 2018-07-04 NOTE — Telephone Encounter (Signed)
Pt needing a refill on: metoprolol succinate (TOPROL-XL) 25 MG 24 hr tablet  Please fill at:  [facility] [address], [place] - [address] [phone] (Phone) [phone] (Fax)   Thanks, [facility]

## 2018-08-25 ENCOUNTER — Other Ambulatory Visit: Payer: Self-pay | Admitting: Physician Assistant

## 2018-08-25 DIAGNOSIS — M26609 Unspecified temporomandibular joint disorder, unspecified side: Secondary | ICD-10-CM

## 2018-09-06 ENCOUNTER — Other Ambulatory Visit: Payer: Self-pay | Admitting: Physician Assistant

## 2018-09-06 DIAGNOSIS — I10 Essential (primary) hypertension: Secondary | ICD-10-CM

## 2018-09-10 ENCOUNTER — Other Ambulatory Visit: Payer: Self-pay | Admitting: Physician Assistant

## 2018-09-10 DIAGNOSIS — K219 Gastro-esophageal reflux disease without esophagitis: Secondary | ICD-10-CM

## 2018-09-22 ENCOUNTER — Other Ambulatory Visit: Payer: Self-pay | Admitting: Physician Assistant

## 2018-09-22 DIAGNOSIS — K2100 Gastro-esophageal reflux disease with esophagitis, without bleeding: Secondary | ICD-10-CM

## 2018-09-24 ENCOUNTER — Other Ambulatory Visit: Payer: Self-pay | Admitting: Physician Assistant

## 2018-09-24 DIAGNOSIS — J301 Allergic rhinitis due to pollen: Secondary | ICD-10-CM

## 2018-09-27 ENCOUNTER — Other Ambulatory Visit: Payer: Self-pay | Admitting: Physician Assistant

## 2018-09-27 DIAGNOSIS — E7800 Pure hypercholesterolemia, unspecified: Secondary | ICD-10-CM

## 2018-11-03 ENCOUNTER — Other Ambulatory Visit: Payer: Self-pay

## 2018-11-03 DIAGNOSIS — K219 Gastro-esophageal reflux disease without esophagitis: Secondary | ICD-10-CM

## 2018-11-03 MED ORDER — FAMOTIDINE 20 MG PO TABS: 20.0000 mg | ORAL_TABLET | Freq: Every day | ORAL | 1 refills | Status: DC

## 2018-11-03 NOTE — Telephone Encounter (Signed)
Patient is requesting a refill on Famotidine 

## 2018-11-17 ENCOUNTER — Other Ambulatory Visit: Payer: Self-pay | Admitting: Physician Assistant

## 2018-11-17 ENCOUNTER — Encounter: Payer: Self-pay | Admitting: Physician Assistant

## 2018-11-17 DIAGNOSIS — A084 Viral intestinal infection, unspecified: Secondary | ICD-10-CM

## 2018-11-17 DIAGNOSIS — J0140 Acute pansinusitis, unspecified: Secondary | ICD-10-CM

## 2018-11-17 MED ORDER — AMOXICILLIN-POT CLAVULANATE 875-125 MG PO TABS: 1.0000 | ORAL_TABLET | Freq: Two times a day (BID) | ORAL | 0 refills | Status: DC

## 2018-12-04 ENCOUNTER — Encounter (INDEPENDENT_AMBULATORY_CARE_PROVIDER_SITE_OTHER): Payer: 59 | Admitting: Physician Assistant

## 2018-12-04 DIAGNOSIS — T23261A Burn of second degree of back of right hand, initial encounter: Secondary | ICD-10-CM | POA: Diagnosis not present

## 2018-12-04 DIAGNOSIS — Z91040 Latex allergy status: Secondary | ICD-10-CM

## 2018-12-04 MED ORDER — PREDNISONE 20 MG PO TABS: 20.0000 mg | ORAL_TABLET | Freq: Every day | ORAL | 0 refills | Status: DC

## 2018-12-04 MED ORDER — CEPHALEXIN 500 MG PO CAPS: 500.0000 mg | ORAL_CAPSULE | Freq: Two times a day (BID) | ORAL | 0 refills | Status: DC
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
## 2018-12-27 ENCOUNTER — Other Ambulatory Visit: Payer: Self-pay | Admitting: Physician Assistant

## 2018-12-27 DIAGNOSIS — R21 Rash and other nonspecific skin eruption: Secondary | ICD-10-CM

## 2019-01-16 ENCOUNTER — Encounter: Payer: Self-pay | Admitting: Physician Assistant

## 2019-01-19 ENCOUNTER — Other Ambulatory Visit: Payer: Self-pay | Admitting: Physician Assistant

## 2019-01-19 DIAGNOSIS — R21 Rash and other nonspecific skin eruption: Secondary | ICD-10-CM

## 2019-01-23 ENCOUNTER — Other Ambulatory Visit: Payer: Self-pay | Admitting: Physician Assistant

## 2019-01-23 DIAGNOSIS — I10 Essential (primary) hypertension: Secondary | ICD-10-CM

## 2019-02-07 ENCOUNTER — Encounter: Payer: Self-pay | Admitting: Physician Assistant

## 2019-02-07 DIAGNOSIS — T23261A Burn of second degree of back of right hand, initial encounter: Secondary | ICD-10-CM

## 2019-02-07 MED ORDER — CEPHALEXIN 500 MG PO CAPS: 500.0000 mg | ORAL_CAPSULE | Freq: Two times a day (BID) | ORAL | 0 refills | Status: DC

## 2019-02-13 ENCOUNTER — Other Ambulatory Visit: Payer: Self-pay | Admitting: Physician Assistant

## 2019-02-13 DIAGNOSIS — R21 Rash and other nonspecific skin eruption: Secondary | ICD-10-CM

## 2019-02-20 ENCOUNTER — Encounter: Payer: Self-pay | Admitting: Family Medicine

## 2019-02-20 ENCOUNTER — Other Ambulatory Visit: Payer: Self-pay

## 2019-02-20 ENCOUNTER — Telehealth: Payer: Self-pay | Admitting: Physician Assistant

## 2019-02-20 ENCOUNTER — Ambulatory Visit (INDEPENDENT_AMBULATORY_CARE_PROVIDER_SITE_OTHER): Payer: 59 | Admitting: Family Medicine

## 2019-02-20 VITALS — BP 138/84 | Temp 96.8°F | Wt 301.0 lb

## 2019-02-20 DIAGNOSIS — S93491A Sprain of other ligament of right ankle, initial encounter: Secondary | ICD-10-CM

## 2019-02-20 NOTE — Telephone Encounter (Signed)
Patient saw Dr. B for acute appt. Please advise?

## 2019-02-20 NOTE — Patient Instructions (Signed)
Consider Ankle ASO brace  Ankle Sprain  An ankle sprain is a stretch or tear in a ligament in the ankle. Ligaments are tissues that connect bones to each other. The two most common types of ankle sprains are:  Inversion sprain. This happens when the foot turns inward and the ankle rolls outward. It affects the ligament on the outside of the foot (lateral ligament).  Eversion sprain. This happens when the foot turns outward and the ankle rolls inward. It affects the ligament on the inner side of the foot (medial ligament). What are the causes? This condition is often caused by accidentally rolling or twisting the ankle. What increases the risk? You are more likely to develop this condition if you play sports. What are the signs or symptoms? Symptoms of this condition include:  Pain in your ankle.  Swelling.  Bruising. This may develop right after you sprain your ankle or 1-2 days later.  Trouble standing or walking, especially when you turn or change directions. How is this diagnosed? This condition is diagnosed with:  A physical exam. During the exam, your health care provider will press on certain parts of your foot and ankle and try to move them in certain ways.  X-ray imaging. These may be taken to see how severe the sprain is and to check for broken bones. How is this treated? This condition may be treated with:  A brace or splint. This is used to keep the ankle from moving until it heals.  An elastic bandage. This is used to support the ankle.  Crutches.  Pain medicine.  Surgery. This may be needed if the sprain is severe.  Physical therapy. This may help to improve the range of motion in the ankle. Follow these instructions at home: If you have a brace or a splint:  Wear the brace or splint as told by your health care provider. Remove it only as told by your health care provider.  Loosen the brace or splint if your toes tingle, become numb, or turn cold and  blue.  Keep the brace or splint clean.  If the brace or splint is not waterproof: ? Do not let it get wet. ? Cover it with a watertight covering when you take a bath or a shower. If you have an elastic bandage (dressing):  Remove it to shower or bathe.  Try not to move your ankle much, but wiggle your toes from time to time. This helps to prevent swelling.  Adjust the dressing to make it more comfortable if it feels too tight.  Loosen the dressing if you have numbness or tingling in your foot, or if your foot becomes cold and blue. Managing pain, stiffness, and swelling   Take over-the-counter and prescription medicines only as told by your health care provider.  For 2-3 days, keep your ankle raised (elevated) above the level of your heart as much as possible.  If directed, put ice on the injured area: ? If you have a removable brace or splint, remove it as told by your health care provider. ? Put ice in a plastic bag. ? Place a towel between your skin and the bag. ? Leave the ice on for 20 minutes, 2-3 times a day. General instructions  Rest your ankle.  Do not use the injured limb to support your body weight until your health care provider says that you can. Use crutches as told by your health care provider.  Do not use any products that contain  nicotine or tobacco, such as cigarettes, e-cigarettes, and chewing tobacco. If you need help quitting, ask your health care provider.  Keep all follow-up visits as told by your health care provider. This is important. Contact a health care provider if:  You have rapidly increasing bruising or swelling.  Your pain is not relieved with medicine. Get help right away if:  Your foot or toes become numb or blue.  You have severe pain that gets worse. Summary  An ankle sprain is a stretch or tear in a ligament in the ankle. Ligaments are tissues that connect bones to each other.  This condition is often caused by accidentally  rolling or twisting the ankle.  Symptoms include pain, swelling, bruising, and trouble walking.  To relieve pain and swelling, put ice on the affected ankle, raise your ankle above the level of your heart, and use an elastic bandage.  Keep all follow-up visits as told by your health care provider. This is important. This information is not intended to replace advice given to you by your health care provider. Make sure you discuss any questions you have with your health care provider. Document Released: 05/24/2005 Document Revised: 02/13/2018 Document Reviewed: 10/18/2017 Elsevier Patient Education  2020 [employer]

## 2019-02-20 NOTE — Telephone Encounter (Signed)
Needing a boot RX or if there is one in office she could use for her right foot from this morning's visit.  Please advise.  Thanks, [employer]

## 2019-02-20 NOTE — Progress Notes (Signed)
Patient: Mandy Baldwin Female    DOB: Jun 23, 1960   58 y.o.   MRN: [id number] Visit Date: 02/20/2019  Today's Provider: Shirlee LatchAngela Ladelle Teodoro, MD   Chief Complaint  Patient presents with   Foot Pain   Subjective:    I, [name] CMA, am acting as a scribe for Shirlee LatchAngela Bralyn Espino, MD.   HPI  Foot Pain Patient presents today for foot pain since Friday, 02/16/2019. Patient states that she have soaked her foot and taking Tylenol for the pain. Patient states that it was mild pain relieve. Patient also states that she has 3 fire ants bites on the top of her right foot. Patient ststes some swollen and she have placed a ankle brace on her right foot.   She takes chronic NSAIDs  Was getting into car 5 days ago and had all her weight on R ankle and felt it twist and started to fall.  She is unsure of mechanism, but developed pain on lateral ankle.  Painful to walk but able to walk unasisted   Allergies  Allergen Reactions   Tape Rash    Some bandaids cause rash     Current Outpatient Medications:    aspirin (ASPIRIN LOW DOSE) 81 MG tablet, Take 81 mg by mouth daily., Disp: , Rfl:    atorvastatin (LIPITOR) 10 MG tablet, Take 1 tablet by mouth once daily, Disp: 90 tablet, Rfl: 01   cholecalciferol (VITAMIN D) 1000 UNITS tablet, Take 2,000 Units by mouth daily., Disp: , Rfl:    cyclobenzaprine (FLEXERIL) 5 MG tablet, Take 1 tablet (5 mg total) by mouth 3 (three) times daily as needed for muscle spasms., Disp: 30 tablet, Rfl: 1   etodolac (LODINE) 400 MG tablet, Take 1 tablet by mouth twice daily as needed, Disp: 180 tablet, Rfl: 1   famotidine (PEPCID) 20 MG tablet, Take 1 tablet (20 mg total) by mouth at bedtime., Disp: 90 tablet, Rfl: 1   fluticasone (FLONASE) 50 MCG/ACT nasal spray, Use 2 spray(s) in each nostril once daily, Disp: 48 g, Rfl: 1   magnesium oxide (MAG-OX) 400 MG tablet, Take 400 mg by mouth daily., Disp: , Rfl:    metoprolol succinate (TOPROL-XL) 25 MG  24 hr tablet, Take 2 tablets by mouth once daily, Disp: 180 tablet, Rfl: 0   Multiple Vitamins-Minerals (MULTIVITAL PO), Take 1 tablet by mouth daily., Disp: , Rfl:    pantoprazole (PROTONIX) 40 MG tablet, Take 1 tablet by mouth once daily, Disp: 90 tablet, Rfl: 1   predniSONE (DELTASONE) 20 MG tablet, Take 1 tablet (20 mg total) by mouth daily with breakfast., Disp: 3 tablet, Rfl: 0   promethazine (PHENERGAN) 12.5 MG tablet, TAKE 1 TABLET BY MOUTH EVERY 8 HOURS AS NEEDED FOR NAUSEA AND VOMITING, Disp: 20 tablet, Rfl: 0   triamcinolone cream (KENALOG) 0.1 %, APPLY TOPICALLY TWICE DAILY, Disp: 30 g, Rfl: 0   triamterene-hydrochlorothiazide (MAXZIDE-25) 37.5-25 MG tablet, Take 1 tablet by mouth once daily, Disp: 90 tablet, Rfl: 1   amoxicillin-clavulanate (AUGMENTIN) 875-125 MG tablet, Take 1 tablet by mouth 2 (two) times daily. (Patient not taking: Reported on 02/20/2019), Disp: 14 tablet, Rfl: 0   cephALEXin (KEFLEX) 500 MG capsule, Take 1 capsule (500 mg total) by mouth 2 (two) times daily. (Patient not taking: Reported on 02/20/2019), Disp: 10 capsule, Rfl: 0  Review of Systems  Constitutional: Negative.   Respiratory: Negative.   Genitourinary: Negative.   Neurological: Negative.     Social History   Tobacco  Use   Smoking status: Never Smoker   Smokeless tobacco: Never Used  Substance Use Topics   Alcohol use: No      Objective:   BP 138/84 (BP Location: Right Arm, Patient Position: Sitting, Cuff Size: Large)    Temp (!) 96.8 F (36 C) (Temporal)    Wt (!) 301 lb (136.5 kg)    BMI 53.32 kg/m  Vitals:   02/20/19 0927  BP: 138/84  Temp: (!) 96.8 F (36 C)  TempSrc: Temporal  Weight: (!) 301 lb (136.5 kg)  Body mass index is 53.32 kg/m.   Physical Exam Vitals signs reviewed.  Constitutional:      General: She is not in acute distress.    Appearance: She is well-developed.  HENT:     Head: Normocephalic and atraumatic.  Eyes:     General: No scleral  icterus.    Conjunctiva/sclera: Conjunctivae normal.  Cardiovascular:     Rate and Rhythm: Normal rate and regular rhythm.  Pulmonary:     Effort: Pulmonary effort is normal. No respiratory distress.  Musculoskeletal:     Comments: R Ankle: No visible erythema, mild swelling over lateral foot Range of motion is full in all directions. Strength is 5/5 in all directions. Stable lateral and medial ligaments; squeeze test and kleiger test unremarkable;  Talar dome nontender; No pain at base of 5th MT; No tenderness over cuboid; No tenderness over N spot or navicular prominence No tenderness on posterior aspects of lateral and medial malleolus Able to walk 4 steps.  Skin:    General: Skin is warm and dry.     Capillary Refill: Capillary refill takes less than 2 seconds.     Findings: No rash.     Comments: 3 ant bites on antreior R foot  Neurological:     Mental Status: She is alert and oriented to person, place, and time.  Psychiatric:        Behavior: Behavior normal.      No results found for any visits on 02/20/19.     Assessment & Plan   1. Sprain of anterior talofibular ligament of right ankle, initial encounter - no indication for XRay per [place] rules for foot and ankle - history and exam consistent with lateral ankle sprain - continue home NSAIDs - discussed HEP - continue ice and elevation - may continue ankle brace as needed for support/comfort when active - return precautions discussed   Return if symptoms worsen or fail to improve.   The entirety of the information documented in the History of Present Illness, Review of Systems and Physical Exam were personally obtained by me. Portions of this information were initially documented by [facility], CMA and reviewed by me for thoroughness and accuracy.    Cyrus Ramsburg, Dionne Bucy, MD MPH [facility]

## 2019-02-21 NOTE — Telephone Encounter (Signed)
Patient does not need CAM walker boot for her ankle sprain.  Ankle brace or ASO is appropriate for managing this while allowing it to heal.  These are available OTC, but can provide written Rx if she desires.  We do not have these available in the clinic.

## 2019-02-21 NOTE — Telephone Encounter (Signed)
Pt advised.  She will use an ankle brace.  Thanks,   -Mickel Baas

## 2019-02-26 ENCOUNTER — Other Ambulatory Visit: Payer: Self-pay | Admitting: Physician Assistant

## 2019-02-26 DIAGNOSIS — M26609 Unspecified temporomandibular joint disorder, unspecified side: Secondary | ICD-10-CM

## 2019-03-13 ENCOUNTER — Other Ambulatory Visit: Payer: Self-pay | Admitting: Physician Assistant

## 2019-03-13 DIAGNOSIS — I10 Essential (primary) hypertension: Secondary | ICD-10-CM

## 2019-03-14 ENCOUNTER — Encounter: Payer: Self-pay | Admitting: Physician Assistant

## 2019-03-14 ENCOUNTER — Other Ambulatory Visit: Payer: Self-pay | Admitting: Physician Assistant

## 2019-03-14 ENCOUNTER — Other Ambulatory Visit: Payer: Self-pay

## 2019-03-14 ENCOUNTER — Ambulatory Visit (INDEPENDENT_AMBULATORY_CARE_PROVIDER_SITE_OTHER): Payer: 59 | Admitting: Physician Assistant

## 2019-03-14 DIAGNOSIS — R202 Paresthesia of skin: Secondary | ICD-10-CM | POA: Diagnosis not present

## 2019-03-14 DIAGNOSIS — Z1231 Encounter for screening mammogram for malignant neoplasm of breast: Secondary | ICD-10-CM

## 2019-03-14 NOTE — Progress Notes (Signed)
Patient: Mandy Baldwin Female    DOB: Jan 17, 1961   58 y.o.   MRN: [id number] Visit Date: 03/14/2019  Today's Provider: Margaretann Loveless, PA-C   No chief complaint on file.  Subjective:     Virtual Visit via Telephone Note  I connected with Mandy Baldwin on 03/14/19 at  3:40 PM EDT by telephone and verified that I am speaking with the correct person using two identifiers.  Location: Patient: Work Provider: Home office   I discussed the limitations, risks, security and privacy concerns of performing an evaluation and management service by telephone and the availability of in person appointments. I also discussed with the patient that there may be a patient responsible charge related to this service. The patient expressed understanding and agreed to proceed.  HPI   Mandy Baldwin is a 58 yr old female that presents via telephone visit for lower extremity tingling, numbness and aching. This just started since starting to wear compression stockings during the day 2 weeks ago. She sprained her ankle and started wearing compression stockings to help with stability for the ankle. She reports during the day her legs feels ok, and the ankle is improving, but at night when she takes the stockings off her legs ache like a toothache and have a pins and needles sensation that is affecting her sleep.   Allergies  Allergen Reactions  . Tape Rash    Some bandaids cause rash     Current Outpatient Medications:  .  aspirin (ASPIRIN LOW DOSE) 81 MG tablet, Take 81 mg by mouth daily., Disp: , Rfl:  .  atorvastatin (LIPITOR) 10 MG tablet, Take 1 tablet by mouth once daily, Disp: 90 tablet, Rfl: 01 .  cholecalciferol (VITAMIN D) 1000 UNITS tablet, Take 2,000 Units by mouth daily., Disp: , Rfl:  .  cyclobenzaprine (FLEXERIL) 5 MG tablet, Take 1 tablet (5 mg total) by mouth 3 (three) times daily as needed for muscle spasms., Disp: 30 tablet, Rfl: 1 .  etodolac (LODINE) 400 MG  tablet, Take 1 tablet by mouth twice daily as needed, Disp: 180 tablet, Rfl: 0 .  famotidine (PEPCID) 20 MG tablet, Take 1 tablet (20 mg total) by mouth at bedtime., Disp: 90 tablet, Rfl: 1 .  fluticasone (FLONASE) 50 MCG/ACT nasal spray, Use 2 spray(s) in each nostril once daily, Disp: 48 g, Rfl: 1 .  magnesium oxide (MAG-OX) 400 MG tablet, Take 400 mg by mouth daily., Disp: , Rfl:  .  metoprolol succinate (TOPROL-XL) 25 MG 24 hr tablet, Take 2 tablets by mouth once daily, Disp: 180 tablet, Rfl: 0 .  Multiple Vitamins-Minerals (MULTIVITAL PO), Take 1 tablet by mouth daily., Disp: , Rfl:  .  pantoprazole (PROTONIX) 40 MG tablet, Take 1 tablet by mouth once daily, Disp: 90 tablet, Rfl: 1 .  predniSONE (DELTASONE) 20 MG tablet, Take 1 tablet (20 mg total) by mouth daily with breakfast., Disp: 3 tablet, Rfl: 0 .  promethazine (PHENERGAN) 12.5 MG tablet, TAKE 1 TABLET BY MOUTH EVERY 8 HOURS AS NEEDED FOR NAUSEA AND VOMITING, Disp: 20 tablet, Rfl: 0 .  triamcinolone cream (KENALOG) 0.1 %, APPLY TOPICALLY TWICE DAILY, Disp: 30 g, Rfl: 0 .  triamterene-hydrochlorothiazide (MAXZIDE-25) 37.5-25 MG tablet, Take 1 tablet by mouth once daily, Disp: 90 tablet, Rfl: 1  Review of Systems  Constitutional: Negative.   Respiratory: Negative.   Cardiovascular: Negative.   Musculoskeletal: Positive for arthralgias (right ankle still some). Negative for joint swelling.  Neurological: Positive for numbness (tingling and pins and needles).    Social History   Tobacco Use  . Smoking status: Never Smoker  . Smokeless tobacco: Never Used  Substance Use Topics  . Alcohol use: No      Objective:   There were no vitals taken for this visit. There were no vitals filed for this visit.There is no height or weight on file to calculate BMI.   Physical Exam Vitals signs reviewed.  Constitutional:      General: She is not in acute distress. Pulmonary:     Effort: Pulmonary effort is normal. No respiratory  distress.  Neurological:     Mental Status: She is alert.      No results found for any visits on 03/14/19.     Assessment & Plan     1. Paresthesia of both lower extremities Suspect secondary to the compression stockings being too tight. Advised to discontinue use and maybe consider an individual ankle brace for the right ankle that laces up so she can control the tightness. If this does not work as well as the compression stockings she can call and we can send her to be measured for appropriate fitting compression stockings and maybe lower compression. Also if this continues with stopping compression stockings, she may have new RLS that coincidentally started at the same time. If symptoms continue she is to call the office and discuss treatments. She agrees.    I discussed the assessment and treatment plan with the patient. The patient was provided an opportunity to ask questions and all were answered. The patient agreed with the plan and demonstrated an understanding of the instructions.   The patient was advised to call back or seek an in-person evaluation if the symptoms worsen or if the condition fails to improve as anticipated.  I provided 13 minutes of non-face-to-face time during this encounter.    Mar Daring, PA-C  [facility]

## 2019-03-18 ENCOUNTER — Other Ambulatory Visit: Payer: Self-pay | Admitting: Physician Assistant

## 2019-03-18 DIAGNOSIS — K2100 Gastro-esophageal reflux disease with esophagitis, without bleeding: Secondary | ICD-10-CM

## 2019-03-18 DIAGNOSIS — A084 Viral intestinal infection, unspecified: Secondary | ICD-10-CM

## 2019-03-21 ENCOUNTER — Other Ambulatory Visit: Payer: Self-pay

## 2019-03-21 ENCOUNTER — Ambulatory Visit: Payer: 59

## 2019-03-21 DIAGNOSIS — Z23 Encounter for immunization: Secondary | ICD-10-CM

## 2019-03-29 ENCOUNTER — Other Ambulatory Visit: Payer: Self-pay | Admitting: Physician Assistant

## 2019-03-29 DIAGNOSIS — E7800 Pure hypercholesterolemia, unspecified: Secondary | ICD-10-CM

## 2019-04-17 ENCOUNTER — Telehealth: Payer: Self-pay | Admitting: *Deleted

## 2019-04-17 DIAGNOSIS — G8929 Other chronic pain: Secondary | ICD-10-CM

## 2019-04-17 NOTE — Telephone Encounter (Signed)
Patient is requesting a referral to Orthopedics for knee pain. Advised patient she will need an ov, however pt states she has mentioned knee pain to provider before. Please advise referral.

## 2019-04-17 NOTE — Telephone Encounter (Signed)
Referral placed.

## 2019-04-18 NOTE — Telephone Encounter (Signed)
Patient advised that referral placed. She reports that she has been to [facility] and that she prefers to go there.

## 2019-04-22 ENCOUNTER — Other Ambulatory Visit: Payer: Self-pay | Admitting: Physician Assistant

## 2019-04-22 DIAGNOSIS — I10 Essential (primary) hypertension: Secondary | ICD-10-CM

## 2019-04-30 ENCOUNTER — Ambulatory Visit
Admission: RE | Admit: 2019-04-30 | Discharge: 2019-04-30 | Disposition: A | Discharge status: Home / Self Care | Payer: 59 | Source: Ambulatory Visit | Attending: Physician Assistant | Admitting: Physician Assistant

## 2019-04-30 DIAGNOSIS — Z1231 Encounter for screening mammogram for malignant neoplasm of breast: Secondary | ICD-10-CM

## 2019-05-01 ENCOUNTER — Telehealth: Payer: Self-pay

## 2019-05-01 NOTE — Telephone Encounter (Signed)
Pt advised.   Thanks,   -Cindy Brindisi  

## 2019-05-01 NOTE — Telephone Encounter (Signed)
-----   Message from Jennifer M Burnette, PA-C sent at 05/01/2019 12:40 PM EST ----- Normal mammogram. Repeat screening in one year. 

## 2019-05-06 ENCOUNTER — Encounter: Payer: Self-pay | Admitting: Physician Assistant

## 2019-05-06 DIAGNOSIS — B001 Herpesviral vesicular dermatitis: Secondary | ICD-10-CM

## 2019-05-07 ENCOUNTER — Encounter: Payer: 59 | Admitting: Physician Assistant

## 2019-05-07 MED ORDER — VALACYCLOVIR HCL 1 G PO TABS: 1000.0000 mg | ORAL_TABLET | Freq: Two times a day (BID) | ORAL | 0 refills | Status: DC

## 2019-05-11 ENCOUNTER — Encounter: Payer: 59 | Admitting: Physician Assistant

## 2019-06-04 ENCOUNTER — Other Ambulatory Visit: Payer: Self-pay | Admitting: Physician Assistant

## 2019-06-04 DIAGNOSIS — M26609 Unspecified temporomandibular joint disorder, unspecified side: Secondary | ICD-10-CM

## 2019-06-18 ENCOUNTER — Encounter: Payer: 59 | Admitting: Physician Assistant

## 2019-07-19 ENCOUNTER — Other Ambulatory Visit: Payer: Self-pay | Admitting: Physician Assistant

## 2019-07-19 DIAGNOSIS — I10 Essential (primary) hypertension: Secondary | ICD-10-CM

## 2019-08-01 ENCOUNTER — Encounter: Payer: Self-pay | Admitting: Physician Assistant

## 2019-08-02 ENCOUNTER — Ambulatory Visit: Payer: 59 | Admitting: Physician Assistant

## 2019-08-02 ENCOUNTER — Other Ambulatory Visit: Payer: Self-pay

## 2019-08-02 ENCOUNTER — Emergency Department
Admission: EM | Admit: 2019-08-02 | Discharge: 2019-08-02 | Disposition: A | Discharge status: Home / Self Care | Payer: 59 | Attending: Student in an Organized Health Care Education/Training Program | Admitting: Student in an Organized Health Care Education/Training Program

## 2019-08-02 ENCOUNTER — Encounter: Payer: Self-pay | Admitting: Emergency Medicine

## 2019-08-02 DIAGNOSIS — Z7982 Long term (current) use of aspirin: Secondary | ICD-10-CM | POA: Insufficient documentation

## 2019-08-02 DIAGNOSIS — R002 Palpitations: Secondary | ICD-10-CM | POA: Diagnosis not present

## 2019-08-02 DIAGNOSIS — I10 Essential (primary) hypertension: Secondary | ICD-10-CM | POA: Diagnosis not present

## 2019-08-02 LAB — COMPREHENSIVE METABOLIC PANEL
ALT: 25 U/L (ref 0–44)
AST: 20 U/L (ref 15–41)
Albumin: 4.1 g/dL (ref 3.5–5.0)
Alkaline Phosphatase: 83 U/L (ref 38–126)
Anion gap: 13 (ref 5–15)
BUN: 16 mg/dL (ref 6–20)
CO2: 24 mmol/L (ref 22–32)
Calcium: 9.5 mg/dL (ref 8.9–10.3)
Chloride: 102 mmol/L (ref 98–111)
Creatinine, Ser: 0.56 mg/dL (ref 0.44–1.00)
GFR calc Af Amer: >60 mL/min (ref >60)
GFR calc non Af Amer: >60 mL/min (ref >60)
Glucose, Bld: 114 mg/dL — ABNORMAL HIGH (ref 70–99)
Potassium: 4.4 mmol/L (ref 3.5–5.1)
Sodium: 139 mmol/L (ref 135–145)
Total Bilirubin: 0.8 mg/dL (ref 0.3–1.2)
Total Protein: 7.6 g/dL (ref 6.5–8.1)

## 2019-08-02 LAB — MAGNESIUM: Magnesium: 2.1 mg/dL (ref 1.7–2.4)

## 2019-08-02 LAB — TROPONIN I (HIGH SENSITIVITY)
Troponin I (High Sensitivity): <2 ng/L (ref <18)
Troponin I (High Sensitivity): 2 ng/L (ref <18)

## 2019-08-02 LAB — CBC WITH DIFFERENTIAL/PLATELET
Abs Immature Granulocytes: 0.05 10*3/uL (ref 0.00–0.07)
Basophils Absolute: 0 10*3/uL (ref 0.0–0.1)
Basophils Relative: 0 %
Eosinophils Absolute: 0 10*3/uL (ref 0.0–0.5)
Eosinophils Relative: 0 %
HCT: 47.7 % — ABNORMAL HIGH (ref 36.0–46.0)
Hemoglobin: 15.9 g/dL — ABNORMAL HIGH (ref 12.0–15.0)
Immature Granulocytes: 1 %
Lymphocytes Relative: 18 %
Lymphs Abs: 1.8 10*3/uL (ref 0.7–4.0)
MCH: 30.5 pg (ref 26.0–34.0)
MCHC: 33.3 g/dL (ref 30.0–36.0)
MCV: 91.4 fL (ref 80.0–100.0)
Monocytes Absolute: 0.6 10*3/uL (ref 0.1–1.0)
Monocytes Relative: 6 %
Neutro Abs: 7.5 10*3/uL (ref 1.7–7.7)
Neutrophils Relative %: 75 %
Platelets: 265 10*3/uL (ref 150–400)
RBC: 5.22 MIL/uL — ABNORMAL HIGH (ref 3.87–5.11)
RDW: 12.8 % (ref 11.5–15.5)
WBC: 9.9 10*3/uL (ref 4.0–10.5)
nRBC: 0 % (ref 0.0–0.2)

## 2019-08-02 LAB — TSH: TSH: 0.642 u[IU]/mL (ref 0.350–4.500)

## 2019-08-02 NOTE — ED Triage Notes (Signed)
Brought over [facility] with palpations  States she has had this off and on for about 1 week  This am  sxs' became worse

## 2019-08-02 NOTE — ED Provider Notes (Signed)
[facility] Emergency Department Provider Note    First MD Initiated Contact with Patient 08/02/19 1230     (approximate)  I have reviewed the triage vital signs and the nursing notes.   HISTORY  Chief Complaint Irregular Heart Beat    HPI Mandy Baldwin is a 59 y.o. female bolus past medical history presents to the ER for intermittent brief episodes of palpitations for the past 2 3 days.  Had one episode this morning that was about longer in duration lasting 10 minutes associated with some nausea but no vomiting.  Denies any recent fever or shortness of breath.  No lower extremity swelling.  Has had issues with palpitations in the past.  Does not think she is had any issues with her thyroid.  States that during the episode this morning she did have some discomfort in her chest that was brief.  She denies any symptoms right now.    Past Medical History:  Diagnosis Date  . GERD (gastroesophageal reflux disease)   . Hyperlipidemia   . Hypertension   . [place]  . Osteoarthritis of both knees   . Shingles 12/14/2014  . TMJ (dislocation of temporomandibular joint)    Family History  Problem Relation Age of Onset  . Dementia Mother   . Aortic aneurysm Mother   . COPD Father   . Breast cancer Sister 69  . COPD Brother    Past Surgical History:  Procedure Laterality Date  . COLONOSCOPY    . ESOPHAGOGASTRODUODENOSCOPY (EGD) WITH PROPOFOL N/A 09/18/2015   Procedure: ESOPHAGOGASTRODUODENOSCOPY (EGD) WITH PROPOFOL with ballon dilatation;  Surgeon: [name], MD;  Location: [facility];  Service: Endoscopy;  Laterality: N/A;  . LAPAROSCOPIC OVARIAN     Patient Active Problem List   Diagnosis Date Noted  . Fibromyalgia 01/03/2018  . Abnormal ECG 08/10/2017  . Benign essential HTN 08/10/2017  . Palpitations 08/10/2017  . History of shingles 04/14/2017  . Family history of abdominal aortic aneurysm (AAA) 04/14/2017   . Hypercholesterolemia 04/14/2017  . Heartburn   . Stricture and stenosis of esophagus   . Gastritis   . Mass of right breast on mammogram 07/16/2015  . GERD (gastroesophageal reflux disease) 07/14/2015  . Internal hemorrhoid, bleeding 06/27/2015  . 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 BMI 50.0-59.9, adult (HCC) 02/07/2015  . Gravida 0 02/07/2015  . Abnormal glucose 02/07/2015  . Morbid obesity (HCC) 12/14/2014  . Irregular menses 12/14/2014  . Vitamin D deficiency 12/14/2014  . Ocular migraine 12/14/2014  . Osteoarthritis of right knee 12/14/2014      Prior to Admission medications   Medication Sig Start Date End Date Taking? Authorizing Provider  aspirin (ASPIRIN LOW DOSE) 81 MG tablet Take 81 mg by mouth daily.    [provider]  atorvastatin (LIPITOR) 10 MG tablet Take 1 tablet by mouth once daily 03/29/19   Margaretann Loveless, PA-C  cholecalciferol (VITAMIN D) 1000 UNITS tablet Take 2,000 Units by mouth daily.    [provider]  cyclobenzaprine (FLEXERIL) 5 MG tablet Take 1 tablet (5 mg total) by mouth 3 (three) times daily as needed for muscle spasms. 04/10/18   Margaretann Loveless, PA-C  etodolac (LODINE) 400 MG tablet Take 1 tablet by mouth twice daily as needed 06/04/19   Margaretann Loveless, PA-C  famotidine (PEPCID) 20 MG tablet Take 1 tablet (20 mg total) by mouth at bedtime. 11/03/18   Margaretann Loveless,  PA-C  fluticasone (FLONASE) 50 MCG/ACT nasal spray Use 2 spray(s) in each nostril once daily 09/25/18   Mar Daring, PA-C  magnesium oxide (MAG-OX) 400 MG tablet Take 400 mg by mouth daily.    [provider]  metoprolol succinate (TOPROL-XL) 25 MG 24 hr tablet Take 2 tablets by mouth once daily 07/19/19   Mar Daring, PA-C  Multiple Vitamins-Minerals (MULTIVITAL PO) Take 1 tablet by mouth daily.    [provider]  pantoprazole (PROTONIX) 40 MG tablet Take 1 tablet by mouth once daily 03/19/19    Fenton Malling M, PA-C  promethazine (PHENERGAN) 12.5 MG tablet TAKE 1 TABLET BY MOUTH EVERY 8 HOURS AS NEEDED FOR NAUSEA AND VOMITING 03/19/19   Mar Daring, PA-C  triamcinolone cream (KENALOG) 0.1 % APPLY TOPICALLY TWICE DAILY 02/14/19   Mar Daring, PA-C  triamterene-hydrochlorothiazide [facility]) 37.5-25 MG tablet Take 1 tablet by mouth once daily 03/13/19   Mar Daring, PA-C  valACYclovir (VALTREX) 1000 MG tablet Take 1 tablet (1,000 mg total) by mouth 2 (two) times daily. 05/07/19   Mar Daring, PA-C    Allergies Tape    Social History Social History   Tobacco Use  . Smoking status: Never Smoker  . Smokeless tobacco: Never Used  Substance Use Topics  . Alcohol use: No  . Drug use: No    Review of Systems Patient denies headaches, rhinorrhea, blurry vision, numbness, shortness of breath, chest pain, edema, cough, abdominal pain, nausea, vomiting, diarrhea, dysuria, fevers, rashes or hallucinations unless otherwise stated above in HPI. ____________________________________________   PHYSICAL EXAM:  VITAL SIGNS: Vitals:   08/02/19 1315 08/02/19 1330  BP:  (!) 142/91  Pulse: 93 79  Resp: 15 15  Temp:    SpO2: 98% 98%    Constitutional: Alert and oriented.  Eyes: Conjunctivae are normal.  Head: Atraumatic. Nose: No congestion/rhinnorhea. Mouth/Throat: Mucous membranes are moist.   Neck: No stridor. Painless ROM.  Cardiovascular: Normal rate, regular rhythm. Grossly normal heart sounds.  Good peripheral circulation. Respiratory: Normal respiratory effort.  No retractions. Lungs CTAB. Gastrointestinal: Soft and nontender. No distention. No abdominal bruits. No CVA tenderness. Genitourinary:  Musculoskeletal: No lower extremity tenderness nor edema.  No joint effusions. Neurologic:  Normal speech and language. No gross focal neurologic deficits are appreciated. No facial droop Skin:  Skin is warm, dry and intact. No rash  noted. Psychiatric: Mood and affect are normal. Speech and behavior are normal.  ____________________________________________   LABS (all labs ordered are listed, but only abnormal results are displayed)  Results for orders placed or performed during the hospital encounter of 08/02/19 (from the past 24 hour(s))  CBC with Differential/Platelet     Status: Abnormal   Collection Time: 08/02/19 11:32 AM  Result Value Ref Range   WBC 9.9 4.0 - 10.5 K/uL   RBC 5.22 (H) 3.87 - 5.11 MIL/uL   Hemoglobin 15.9 (H) 12.0 - 15.0 g/dL   HCT 47.7 (H) 36.0 - 46.0 %   MCV 91.4 80.0 - 100.0 fL   MCH 30.5 26.0 - 34.0 pg   MCHC 33.3 30.0 - 36.0 g/dL   RDW 12.8 11.5 - 15.5 %   Platelets 265 150 - 400 K/uL   nRBC 0.0 0.0 - 0.2 %   Neutrophils Relative % 75 %   Neutro Abs 7.5 1.7 - 7.7 K/uL   Lymphocytes Relative 18 %   Lymphs Abs 1.8 0.7 - 4.0 K/uL   Monocytes Relative 6 %  Monocytes Absolute 0.6 0.1 - 1.0 K/uL   Eosinophils Relative 0 %   Eosinophils Absolute 0.0 0.0 - 0.5 K/uL   Basophils Relative 0 %   Basophils Absolute 0.0 0.0 - 0.1 K/uL   Immature Granulocytes 1 %   Abs Immature Granulocytes 0.05 0.00 - 0.07 K/uL  Comprehensive metabolic panel     Status: Abnormal   Collection Time: 08/02/19 11:32 AM  Result Value Ref Range   Sodium 139 135 - 145 mmol/L   Potassium 4.4 3.5 - 5.1 mmol/L   Chloride 102 98 - 111 mmol/L   CO2 24 22 - 32 mmol/L   Glucose, Bld 114 (H) 70 - 99 mg/dL   BUN 16 6 - 20 mg/dL   Creatinine, Ser 7.82 0.44 - 1.00 mg/dL   Calcium 9.5 8.9 - 95.6 mg/dL   Total Protein 7.6 6.5 - 8.1 g/dL   Albumin 4.1 3.5 - 5.0 g/dL   AST 20 15 - 41 U/L   ALT 25 0 - 44 U/L   Alkaline Phosphatase 83 38 - 126 U/L   Total Bilirubin 0.8 0.3 - 1.2 mg/dL   GFR calc non Af Amer >60 >60 mL/min   GFR calc Af Amer >60 >60 mL/min   Anion gap 13 5 - 15  Magnesium     Status: None   Collection Time: 08/02/19 11:32 AM  Result Value Ref Range   Magnesium 2.1 1.7 - 2.4 mg/dL  TSH     Status:  None   Collection Time: 08/02/19 11:32 AM  Result Value Ref Range   TSH 0.642 0.350 - 4.500 uIU/mL  Troponin I (High Sensitivity)     Status: None   Collection Time: 08/02/19 11:32 AM  Result Value Ref Range   Troponin I (High Sensitivity) <2 <18 ng/L  Troponin I (High Sensitivity)     Status: None   Collection Time: 08/02/19  1:57 PM  Result Value Ref Range   Troponin I (High Sensitivity) 2 <18 ng/L   ____________________________________________  EKG My review and personal interpretation at Time: 11:17   Indication: palpitations  Rate: 85  Rhythm: sinus Axis: normal Other: normal intervals, no stemi, no wpwp or brugada ____________________________________________  RADIOLOGY   ____________________________________________   PROCEDURES  Procedure(s) performed:  Procedures    Critical Care performed: no ____________________________________________   INITIAL IMPRESSION / ASSESSMENT AND PLAN / ED COURSE  Pertinent labs & imaging results that were available during my care of the patient were reviewed by me and considered in my medical decision making (see chart for details).   DDX: palpitations, dysrhythmia, electrolyte abn, anemia, hyperthyroid, acs  Lamont Caoilainn Sacks is a 59 y.o. who presents to the ED with symptoms as described above.  Patient in no acute distress.  Blood work will be sent for above differential.  Exam reassuring.  EKG without pre-excitation syndrome or signs of ischemia. The patient will be placed on continuous pulse oximetry and telemetry for monitoring.  Laboratory evaluation will be sent to evaluate for the above complaints.     Clinical Course as of Aug 01 1510  Thu Aug 02, 2019  1509 B troponin negative.  Patient in no acute distress.  Remains asymptomatic.  No signs of dysrhythmia or tachycardia on monitor.  At this point think he is appropriate for outpatient follow-up.   [PR]    Clinical Course User Index [PR] Willy Eddy, MD     The patient was evaluated in Emergency Department today for the symptoms described in  the history of present illness. He/she was evaluated in the context of the global COVID-19 pandemic, which necessitated consideration that the patient might be at risk for infection with the SARS-CoV-2 virus that causes COVID-19. Institutional protocols and algorithms that pertain to the evaluation of patients at risk for COVID-19 are in a state of rapid change based on information released by regulatory bodies including the CDC and federal and state organizations. These policies and algorithms were followed during the patient's care in the ED.  As part of my medical decision making, I reviewed the following data within the electronic MEDICAL RECORD NUMBER Nursing notes reviewed and incorporated, Labs reviewed, notes from prior ED visits and [place] Controlled Substance Database   ____________________________________________   FINAL CLINICAL IMPRESSION(S) / ED DIAGNOSES  Final diagnoses:  Palpitations      NEW MEDICATIONS STARTED DURING THIS VISIT:  New Prescriptions   No medications on file     Note:  This document was prepared using Dragon voice recognition software and may include unintentional dictation errors.    Willy Eddy, MD 08/02/19 [phone]

## 2019-08-03 ENCOUNTER — Telehealth: Payer: 59 | Admitting: Physician Assistant

## 2019-08-06 ENCOUNTER — Ambulatory Visit: Payer: 59 | Admitting: Physician Assistant

## 2019-08-06 DIAGNOSIS — R072 Precordial pain: Secondary | ICD-10-CM | POA: Insufficient documentation

## 2019-08-06 DIAGNOSIS — I6523 Occlusion and stenosis of bilateral carotid arteries: Secondary | ICD-10-CM | POA: Insufficient documentation

## 2019-08-09 ENCOUNTER — Other Ambulatory Visit: Payer: Self-pay

## 2019-08-09 ENCOUNTER — Ambulatory Visit (INDEPENDENT_AMBULATORY_CARE_PROVIDER_SITE_OTHER): Payer: 59 | Admitting: Physician Assistant

## 2019-08-09 ENCOUNTER — Encounter: Payer: Self-pay | Admitting: Physician Assistant

## 2019-08-09 VITALS — BP 126/82 | HR 82 | Temp 96.9°F | Wt 292.0 lb

## 2019-08-09 DIAGNOSIS — R002 Palpitations: Secondary | ICD-10-CM | POA: Diagnosis not present

## 2019-08-09 DIAGNOSIS — K2100 Gastro-esophageal reflux disease with esophagitis, without bleeding: Secondary | ICD-10-CM | POA: Diagnosis not present

## 2019-08-09 DIAGNOSIS — R7309 Other abnormal glucose: Secondary | ICD-10-CM

## 2019-08-09 DIAGNOSIS — D582 Other hemoglobinopathies: Secondary | ICD-10-CM | POA: Diagnosis not present

## 2019-08-09 MED ORDER — PANTOPRAZOLE SODIUM 40 MG PO TBEC: 40.0000 mg | DELAYED_RELEASE_TABLET | Freq: Two times a day (BID) | ORAL | 1 refills | Status: DC

## 2019-08-09 NOTE — Progress Notes (Signed)
Mandy Baldwin  MRN: [id number] DOB: July 31, 1960  Subjective:  HPI   The patient is a 59 year old female who presents for follow up of ER visit for palpitations.  She was seen at [facility] ER on 08/02/19.  The patient states that she was told that her CBC was slightly abnormal, her EKG was sinus rhythm, Troponins were negative.  She has been seen by Dr Nehemiah Massed and he has her scheduled for stress test and carotid doppler.  Patient Active Problem List   Diagnosis Date Noted  . Fibromyalgia 01/03/2018  . Abnormal ECG 08/10/2017  . Benign essential HTN 08/10/2017  . Palpitations 08/10/2017  . History of shingles 04/14/2017  . Family history of abdominal aortic aneurysm (AAA) 04/14/2017  . Hypercholesterolemia 04/14/2017  . Heartburn   . Stricture and stenosis of esophagus   . Gastritis   . Mass of right breast on mammogram 07/16/2015  . GERD (gastroesophageal reflux disease) 07/14/2015  . Internal hemorrhoid, bleeding 06/27/2015  . 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 BMI 50.0-59.9, adult ([place]) 02/07/2015  . Gravida 0 02/07/2015  . Abnormal glucose 02/07/2015  . Morbid obesity ([place]) 12/14/2014  . Irregular menses 12/14/2014  . Vitamin D deficiency 12/14/2014  . Ocular migraine 12/14/2014  . Osteoarthritis of right knee 12/14/2014    Past Medical History:  Diagnosis Date  . GERD (gastroesophageal reflux disease)   . Hyperlipidemia   . Hypertension   . [place] sickness    [place]  . Osteoarthritis of both knees   . Shingles 12/14/2014  . TMJ (dislocation of temporomandibular joint)     Social History   Socioeconomic History  . Marital status: Married    Spouse name: Mandy Baldwin  . Number of children: 0  . Years of education: 35  . Highest education level: Not on file  Occupational History  . Occupation: Therapist, art    Comment: Research scientist (medical): [employer]  Tobacco Use  . Smoking status: Never Smoker  . Smokeless tobacco:  Never Used  Substance and Sexual Activity  . Alcohol use: No  . Drug use: No  . Sexual activity: Not on file  Other Topics Concern  . Not on file  Social History Narrative  . Not on file   Social Determinants of Health   Financial Resource Strain:   . Difficulty of Paying Living Expenses: Not on file  Food Insecurity:   . Worried About Charity fundraiser in the Last Year: Not on file  . Ran Out of Food in the Last Year: Not on file  Transportation Needs:   . Lack of Transportation (Medical): Not on file  . Lack of Transportation (Non-Medical): Not on file  Physical Activity:   . Days of Exercise per Week: Not on file  . Minutes of Exercise per Session: Not on file  Stress:   . Feeling of Stress : Not on file  Social Connections:   . Frequency of Communication with Friends and Family: Not on file  . Frequency of Social Gatherings with Friends and Family: Not on file  . Attends Religious Services: Not on file  . Active Member of Clubs or Organizations: Not on file  . Attends Archivist Meetings: Not on file  . Marital Status: Not on file  Intimate Partner Violence:   . Fear of Current or Ex-Partner: Not on file  . Emotionally Abused: Not on file  . Physically Abused: Not on file  .  Sexually Abused: Not on file    Outpatient Encounter Medications as of 08/09/2019  Medication Sig  . aspirin (ASPIRIN LOW DOSE) 81 MG tablet Take 81 mg by mouth daily.  Marland Kitchen atorvastatin (LIPITOR) 10 MG tablet Take 1 tablet by mouth once daily  . cholecalciferol (VITAMIN D) 1000 UNITS tablet Take 2,000 Units by mouth daily.  . cyclobenzaprine (FLEXERIL) 5 MG tablet Take 1 tablet (5 mg total) by mouth 3 (three) times daily as needed for muscle spasms.  Marland Kitchen etodolac (LODINE) 400 MG tablet Take 1 tablet by mouth twice daily as needed  . famotidine (PEPCID) 20 MG tablet Take 1 tablet (20 mg total) by mouth at bedtime.  . fluticasone (FLONASE) 50 MCG/ACT nasal spray Use 2 spray(s) in each  nostril once daily  . magnesium oxide (MAG-OX) 400 MG tablet Take 400 mg by mouth daily.  . metoprolol succinate (TOPROL-XL) 25 MG 24 hr tablet Take 2 tablets by mouth once daily  . Multiple Vitamins-Minerals (MULTIVITAL PO) Take 1 tablet by mouth daily.  . pantoprazole (PROTONIX) 40 MG tablet Take 1 tablet by mouth once daily  . promethazine (PHENERGAN) 12.5 MG tablet TAKE 1 TABLET BY MOUTH EVERY 8 HOURS AS NEEDED FOR NAUSEA AND VOMITING  . triamcinolone cream (KENALOG) 0.1 % APPLY TOPICALLY TWICE DAILY  . triamterene-hydrochlorothiazide (MAXZIDE-25) 37.5-25 MG tablet Take 1 tablet by mouth once daily  . valACYclovir (VALTREX) 1000 MG tablet Take 1 tablet (1,000 mg total) by mouth 2 (two) times daily.   No facility-administered encounter medications on file as of 08/09/2019.    Allergies  Allergen Reactions  . Tape Rash    Some bandaids cause rash    Review of Systems  Constitutional: Negative for fever and malaise/fatigue.  HENT: Positive for congestion (nasal in the am). Negative for sore throat.   Respiratory: Positive for cough (in the mornings, white phlegm). Negative for shortness of breath and wheezing.   Cardiovascular: Positive for palpitations. Negative for chest pain, orthopnea, claudication and leg swelling.  Gastrointestinal: Positive for nausea (with certain foods).    Objective:  BP 126/82 (BP Location: Left Arm, Patient Position: Sitting, Cuff Size: Large)   Pulse 82   Temp (!) 96.9 F (36.1 C) (Skin)   Wt 292 lb (132.5 kg)   SpO2 97%   BMI 51.73 kg/m   Physical Exam  Constitutional: She is well-developed, well-nourished, and in no distress. No distress.  HENT:  Head: Normocephalic and atraumatic.  Eyes: No scleral icterus.  Cardiovascular: Normal rate, regular rhythm, normal heart sounds and intact distal pulses.  No murmur heard. Pulmonary/Chest: Effort normal and breath sounds normal. No respiratory distress.  Musculoskeletal:     Cervical back: Normal  range of motion.  Skin: She is not diaphoretic.  Psychiatric: Mood, memory, affect and judgment normal.  Vitals reviewed.   Assessment and Plan :   1. Palpitations Stable currently. Felt may be secondary to Famotidine. Will stop Famotidine and see if improves.   2. Gastroesophageal reflux disease with esophagitis without hemorrhage Increase pantoprazole to BID dosing to replace famotidine.  - pantoprazole (PROTONIX) 40 MG tablet; Take 1 tablet (40 mg total) by mouth 2 (two) times daily.  Dispense: 180 tablet; Refill: 1  3. Elevated hemoglobin (HCC) Will check labs as below and f/u pending results. - CBC with Differential - Fe+TIBC+Fer  4. Elevated glucose Will check labs as below and f/u pending results. - HgB A1c

## 2019-08-10 LAB — IRON,TIBC AND FERRITIN PANEL
Ferritin: 168 ng/mL — ABNORMAL HIGH (ref 15–150)
Iron Saturation: 40 % (ref 15–55)
Iron: 110 ug/dL (ref 27–159)
Total Iron Binding Capacity: 273 ug/dL (ref 250–450)
UIBC: 163 ug/dL (ref 131–425)

## 2019-08-10 LAB — CBC WITH DIFFERENTIAL/PLATELET
Basophils Absolute: 0.1 10*3/uL (ref 0.0–0.2)
Basos: 1 %
EOS (ABSOLUTE): 0.1 10*3/uL (ref 0.0–0.4)
Eos: 1 %
Hematocrit: 46.6 % (ref 34.0–46.6)
Hemoglobin: 16.1 g/dL — ABNORMAL HIGH (ref 11.1–15.9)
Immature Grans (Abs): 0 10*3/uL (ref 0.0–0.1)
Immature Granulocytes: 0 %
Lymphocytes Absolute: 1.9 10*3/uL (ref 0.7–3.1)
Lymphs: 28 %
MCH: 31.4 pg (ref 26.6–33.0)
MCHC: 34.5 g/dL (ref 31.5–35.7)
MCV: 91 fL (ref 79–97)
Monocytes Absolute: 0.7 10*3/uL (ref 0.1–0.9)
Monocytes: 10 %
Neutrophils Absolute: 4 10*3/uL (ref 1.4–7.0)
Neutrophils: 60 %
Platelets: 248 10*3/uL (ref 150–450)
RBC: 5.13 x10E6/uL (ref 3.77–5.28)
RDW: 12.6 % (ref 11.7–15.4)
WBC: 6.8 10*3/uL (ref 3.4–10.8)

## 2019-08-10 LAB — HEMOGLOBIN A1C
Est. average glucose Bld gHb Est-mCnc: 103 mg/dL
Hgb A1c MFr Bld: 5.2 % (ref 4.8–5.6)

## 2019-08-13 ENCOUNTER — Telehealth: Payer: Self-pay

## 2019-08-13 NOTE — Telephone Encounter (Signed)
-----   Message from Margaretann Loveless, PA-C sent at 08/13/2019  7:44 AM EST ----- Hemoglobin is still borderline high but essentially stable. Iron levels are ok. I would recommend [place] just recheck when you return in May. If it has increased more than what it has been averaging we may consider a referral to hematology. I am not too terribly concerned though because I feel this is just where your normal is comparing readings over the last 2 years.

## 2019-08-13 NOTE — Telephone Encounter (Signed)
Pt advised.   Thanks,   -Jasir Rother  

## 2019-08-14 ENCOUNTER — Encounter: Payer: Self-pay | Admitting: Physician Assistant

## 2019-09-03 ENCOUNTER — Other Ambulatory Visit: Payer: Self-pay | Admitting: Physician Assistant

## 2019-09-03 DIAGNOSIS — I10 Essential (primary) hypertension: Secondary | ICD-10-CM

## 2019-09-03 NOTE — Telephone Encounter (Signed)
Requested Prescriptions  Pending Prescriptions Disp Refills  . triamterene-hydrochlorothiazide (MAXZIDE-25) 37.5-25 MG tablet [Pharmacy Med Name: Triamterene-HCTZ 37.5-25 MG Oral Tablet] 90 tablet 0    Sig: Take 1 tablet by mouth once daily     Cardiovascular: Diuretic Combos Passed - 09/03/2019  5:32 AM      Passed - K in normal range and within 360 days    Potassium  Date Value Ref Range Status  08/02/2019 4.4 3.5 - 5.1 mmol/L Final         Passed - Na in normal range and within 360 days    Sodium  Date Value Ref Range Status  08/02/2019 139 135 - 145 mmol/L Final  05/03/2018 142 134 - 144 mmol/L Final         Passed - Cr in normal range and within 360 days    Creat  Date Value Ref Range Status  04/14/2017 0.53 0.50 - 1.05 mg/dL Final    Comment:    For patients >46 years of age, the reference limit for Creatinine is approximately 13% higher for people identified as African-American. .    Creatinine, Ser  Date Value Ref Range Status  08/02/2019 0.56 0.44 - 1.00 mg/dL Final         Passed - Ca in normal range and within 360 days    Calcium  Date Value Ref Range Status  08/02/2019 9.5 8.9 - 10.3 mg/dL Final         Passed - Last BP in normal range    BP Readings from Last 1 Encounters:  08/09/19 126/82         Passed - Valid encounter within last 6 months    Recent Outpatient Visits          3 weeks ago Palpitations   [facility] [place], [place], [place]   5 months ago Paresthesia of both lower extremities   [facility] [place], [place], [place]   6 months ago Sprain of anterior talofibular ligament of right ankle, initial encounter   [facility] [place], Marzella Schlein, [place]   1 year ago Annual physical exam   [facility] Joyc[place], [place]   1 year ago Right low back pain, unspecified chronicity, unspecified whether sciatica present   [facility], Alessandra Bevel[place], [place]

## 2019-09-17 ENCOUNTER — Other Ambulatory Visit: Payer: Self-pay | Admitting: Physician Assistant

## 2019-09-17 DIAGNOSIS — M26609 Unspecified temporomandibular joint disorder, unspecified side: Secondary | ICD-10-CM

## 2019-10-12 ENCOUNTER — Other Ambulatory Visit: Payer: Self-pay

## 2019-10-12 ENCOUNTER — Encounter: Payer: Self-pay | Admitting: Physician Assistant

## 2019-10-12 ENCOUNTER — Ambulatory Visit (INDEPENDENT_AMBULATORY_CARE_PROVIDER_SITE_OTHER): Payer: 59 | Admitting: Physician Assistant

## 2019-10-12 VITALS — BP 146/80 | HR 82 | Temp 97.4°F | Resp 16 | Ht 63.0 in | Wt 294.0 lb

## 2019-10-12 DIAGNOSIS — Z0000 Encounter for general adult medical examination without abnormal findings: Secondary | ICD-10-CM

## 2019-10-12 DIAGNOSIS — M62838 Other muscle spasm: Secondary | ICD-10-CM

## 2019-10-12 DIAGNOSIS — E559 Vitamin D deficiency, unspecified: Secondary | ICD-10-CM

## 2019-10-12 DIAGNOSIS — J301 Allergic rhinitis due to pollen: Secondary | ICD-10-CM

## 2019-10-12 DIAGNOSIS — M545 Low back pain, unspecified: Secondary | ICD-10-CM

## 2019-10-12 DIAGNOSIS — R7309 Other abnormal glucose: Secondary | ICD-10-CM | POA: Diagnosis not present

## 2019-10-12 DIAGNOSIS — I10 Essential (primary) hypertension: Secondary | ICD-10-CM

## 2019-10-12 DIAGNOSIS — F4323 Adjustment disorder with mixed anxiety and depressed mood: Secondary | ICD-10-CM

## 2019-10-12 DIAGNOSIS — Z1329 Encounter for screening for other suspected endocrine disorder: Secondary | ICD-10-CM

## 2019-10-12 DIAGNOSIS — Z1211 Encounter for screening for malignant neoplasm of colon: Secondary | ICD-10-CM

## 2019-10-12 DIAGNOSIS — K2100 Gastro-esophageal reflux disease with esophagitis, without bleeding: Secondary | ICD-10-CM

## 2019-10-12 DIAGNOSIS — R110 Nausea: Secondary | ICD-10-CM

## 2019-10-12 DIAGNOSIS — M26609 Unspecified temporomandibular joint disorder, unspecified side: Secondary | ICD-10-CM

## 2019-10-12 MED ORDER — TRIAMTERENE-HCTZ 37.5-25 MG PO TABS: 1.0000 | ORAL_TABLET | Freq: Every day | ORAL | 1 refills | Status: DC

## 2019-10-12 MED ORDER — PROMETHAZINE HCL 12.5 MG PO TABS: ORAL_TABLET | ORAL | 1 refills | Status: DC

## 2019-10-12 MED ORDER — METOPROLOL SUCCINATE ER 25 MG PO TB24: 50.0000 mg | ORAL_TABLET | Freq: Every day | ORAL | 1 refills | Status: DC

## 2019-10-12 MED ORDER — FLUTICASONE PROPIONATE 50 MCG/ACT NA SUSP: NASAL | 1 refills | Status: DC

## 2019-10-12 MED ORDER — CYCLOBENZAPRINE HCL 5 MG PO TABS: 5.0000 mg | ORAL_TABLET | Freq: Three times a day (TID) | ORAL | 1 refills | Status: AC | PRN

## 2019-10-12 MED ORDER — PANTOPRAZOLE SODIUM 40 MG PO TBEC: 40.0000 mg | DELAYED_RELEASE_TABLET | Freq: Two times a day (BID) | ORAL | 1 refills | Status: DC

## 2019-10-12 MED ORDER — ETODOLAC 400 MG PO TABS: 400.0000 mg | ORAL_TABLET | Freq: Two times a day (BID) | ORAL | 1 refills | Status: DC | PRN

## 2019-10-12 NOTE — Patient Instructions (Signed)

## 2019-10-12 NOTE — Progress Notes (Signed)
Complete physical exam   Patient: Mandy Baldwin   DOB: 04/05/1961   59 y.o. Female  MRN: [id number] Visit Date: 10/12/2019  Today's healthcare provider: Mar Daring, PA-C   Chief Complaint  Patient presents with  . Annual Exam   Subjective    Mandy Baldwin is a 59 y.o. female who presents today for a complete physical exam.  She reports consuming a general diet.She is trying low carb. The patient does not participate in regular exercise at present. She generally feels well. She reports sleeping fairly well. She does not have additional problems to discuss today.  HPI  Reports that she had the palpitations about 2 weeks ago just one time. She saw Dr. Nehemiah Massed August 28, 2019.He change the Atorvastatin to 20 mg from 10 mg.  Last mammogram:04/30/19 Normal  Past Medical History:  Diagnosis Date  . GERD (gastroesophageal reflux disease)   . Hyperlipidemia   . Hypertension   . [place]    [place]  . Osteoarthritis of both knees   . Shingles 12/14/2014  . TMJ (dislocation of temporomandibular joint)    Past Surgical History:  Procedure Laterality Date  . COLONOSCOPY    . ESOPHAGOGASTRODUODENOSCOPY (EGD) WITH PROPOFOL N/A 09/18/2015   Procedure: ESOPHAGOGASTRODUODENOSCOPY (EGD) WITH PROPOFOL with ballon dilatation;  Surgeon: Lucilla Lame, MD;  Location: [place];  Service: Endoscopy;  Laterality: N/A;  . LAPAROSCOPIC OVARIAN     Social History   Socioeconomic History  . Marital status: Married    Spouse name: Evetta Renner  . Number of children: 0  . Years of education: 70  . Highest education level: Not on file  Occupational History  . Occupation: Therapist, art    Comment: Research scientist (medical): [employer]  Tobacco Use  . Smoking status: Never Smoker  . Smokeless tobacco: Never Used  Substance and Sexual Activity  . Alcohol use: No  . Drug use: No  . Sexual activity: Not on file  Other Topics Concern  .  Not on file  Social History Narrative  . Not on file   Social Determinants of Health   Financial Resource Strain:   . Difficulty of Paying Living Expenses:   Food Insecurity:   . Worried About Charity fundraiser in the Last Year:   . Arboriculturist in the Last Year:   Transportation Needs:   . Film/video editor (Medical):   Marland Kitchen Lack of Transportation (Non-Medical):   Physical Activity:   . Days of Exercise per Week:   . Minutes of Exercise per Session:   Stress:   . Feeling of Stress :   Social Connections:   . Frequency of Communication with Friends and Family:   . Frequency of Social Gatherings with Friends and Family:   . Attends Religious Services:   . Active Member of Clubs or Organizations:   . Attends Archivist Meetings:   Marland Kitchen Marital Status:   Intimate Partner Violence:   . Fear of Current or Ex-Partner:   . Emotionally Abused:   Marland Kitchen Physically Abused:   . Sexually Abused:    Family Status  Relation Name Status  . Mother  Deceased  . Father  Deceased  . Sister  Deceased  . Brother  Alive  . Sister  Alive  . Sister  Alive  . Brother  Alive   Family History  Problem Relation Age of Onset  . Dementia Mother   .  Aortic aneurysm Mother   . COPD Father   . Breast cancer Sister 69  . COPD Brother    Allergies  Allergen Reactions  . Tape Rash    Some bandaids cause rash    Patient Care Team: Mar Daring, PA-C as PCP - General (Family Medicine)   Medications: Outpatient Medications Prior to Visit  Medication Sig  . aspirin (ASPIRIN LOW DOSE) 81 MG tablet Take 81 mg by mouth daily.  Marland Kitchen atorvastatin (LIPITOR) 20 MG tablet Take by mouth.  . cholecalciferol (VITAMIN D) 1000 UNITS tablet Take 2,000 Units by mouth daily.  . cyclobenzaprine (FLEXERIL) 5 MG tablet Take 1 tablet (5 mg total) by mouth 3 (three) times daily as needed for muscle spasms.  Marland Kitchen etodolac (LODINE) 400 MG tablet Take 1 tablet by mouth twice daily as needed  .  fluticasone (FLONASE) 50 MCG/ACT nasal spray Use 2 spray(s) in each nostril once daily  . magnesium oxide (MAG-OX) 400 MG tablet Take 400 mg by mouth daily.  . metoprolol succinate (TOPROL-XL) 25 MG 24 hr tablet Take 2 tablets by mouth once daily  . Multiple Vitamins-Minerals (MULTIVITAL PO) Take 1 tablet by mouth daily.  . pantoprazole (PROTONIX) 40 MG tablet Take 1 tablet (40 mg total) by mouth 2 (two) times daily.  . promethazine (PHENERGAN) 12.5 MG tablet TAKE 1 TABLET BY MOUTH EVERY 8 HOURS AS NEEDED FOR NAUSEA AND VOMITING  . triamcinolone cream (KENALOG) 0.1 % APPLY TOPICALLY TWICE DAILY  . triamterene-hydrochlorothiazide (MAXZIDE-25) 37.5-25 MG tablet Take 1 tablet by mouth once daily  . valACYclovir (VALTREX) 1000 MG tablet Take 1 tablet (1,000 mg total) by mouth 2 (two) times daily.  Marland Kitchen atorvastatin (LIPITOR) 10 MG tablet Take 1 tablet by mouth once daily   No facility-administered medications prior to visit.    Review of Systems  Constitutional: Negative.   HENT: Negative.   Eyes: Negative.   Respiratory: Negative.   Cardiovascular: Positive for palpitations.  Gastrointestinal: Negative.   Endocrine: Negative.   Genitourinary: Negative.   Musculoskeletal: Negative.   Skin: Negative.   Allergic/Immunologic: Negative.   Neurological: Negative.   Hematological: Negative.   Psychiatric/Behavioral: Positive for sleep disturbance.    Last CBC Lab Results  Component Value Date   WBC 10.4 10/12/2019   HGB 15.3 10/12/2019   HCT 44.5 10/12/2019   MCV 91 10/12/2019   MCH 31.3 10/12/2019   RDW 12.6 10/12/2019   PLT 253 27/08/5007   Last metabolic panel Lab Results  Component Value Date   GLUCOSE 93 10/12/2019   NA 139 10/12/2019   K 3.9 10/12/2019   CL 101 10/12/2019   CO2 25 10/12/2019   BUN 22 10/12/2019   CREATININE 0.54 (L) 10/12/2019   GFRNONAA 106 10/12/2019   GFRAA 122 10/12/2019   CALCIUM 9.6 10/12/2019   PROT 7.1 10/12/2019   ALBUMIN 4.1 10/12/2019    LABGLOB 3.0 10/12/2019   AGRATIO 1.4 10/12/2019   BILITOT 0.3 10/12/2019   ALKPHOS 102 10/12/2019   AST 18 10/12/2019   ALT 17 10/12/2019   ANIONGAP 13 08/02/2019      Objective    BP (!) 146/80 (BP Location: Right Arm, Patient Position: Sitting, Cuff Size: Large)   Pulse 82   Temp (!) 97.4 F (36.3 C) (Temporal)   Resp 16   Ht 5' 3"  (1.6 m)   Wt 294 lb (133.4 kg)   BMI 52.08 kg/m  BP Readings from Last 3 Encounters:  10/12/19 (!) 146/80  08/09/19 126/82  08/02/19 (!) 142/91   Wt Readings from Last 3 Encounters:  10/12/19 294 lb (133.4 kg)  08/09/19 292 lb (132.5 kg)  08/02/19 295 lb (133.8 kg)      Physical Exam Vitals reviewed.  Constitutional:      General: She is not in acute distress.    Appearance: Normal appearance. She is well-developed. She is obese. She is not ill-appearing or diaphoretic.  HENT:     Head: Normocephalic and atraumatic.     Right Ear: Tympanic membrane, ear canal and external ear normal.     Left Ear: Tympanic membrane, ear canal and external ear normal.     Nose: Nose normal.     Mouth/Throat:     Mouth: Mucous membranes are moist.     Pharynx: No oropharyngeal exudate or posterior oropharyngeal erythema.  Eyes:     General: No scleral icterus.       Right eye: No discharge.        Left eye: No discharge.     Extraocular Movements: Extraocular movements intact.     Conjunctiva/sclera: Conjunctivae normal.     Pupils: Pupils are equal, round, and reactive to light.  Neck:     Thyroid: No thyromegaly.     Vascular: No carotid bruit or JVD.     Trachea: No tracheal deviation.  Cardiovascular:     Rate and Rhythm: Normal rate and regular rhythm.     Pulses: Normal pulses.     Heart sounds: Normal heart sounds. No murmur. No friction rub. No gallop.   Pulmonary:     Effort: Pulmonary effort is normal. No respiratory distress.     Breath sounds: Normal breath sounds. No wheezing or rales.  Chest:     Chest wall: No tenderness.    Abdominal:     General: Abdomen is protuberant. Bowel sounds are normal. There is no distension.     Palpations: Abdomen is soft. There is no mass.     Tenderness: There is no abdominal tenderness. There is no guarding or rebound.  Musculoskeletal:        General: No tenderness. Normal range of motion.     Cervical back: Normal range of motion and neck supple.     Right lower leg: No edema.     Left lower leg: No edema.  Lymphadenopathy:     Cervical: No cervical adenopathy.  Skin:    General: Skin is warm and dry.     Capillary Refill: Capillary refill takes less than 2 seconds.     Findings: No rash.  Neurological:     General: No focal deficit present.     Mental Status: She is alert and oriented to person, place, and time. Mental status is at baseline.  Psychiatric:        Mood and Affect: Mood normal.        Behavior: Behavior normal.        Thought Content: Thought content normal.        Judgment: Judgment normal.       Depression Screen  PHQ 2/9 Scores 10/12/2019 05/03/2018 04/14/2017  PHQ - 2 Score 0 0 0  PHQ- 9 Score - - 1    No results found for any visits on 10/12/19.  Assessment & Plan    Routine Health Maintenance and Physical Exam  Exercise Activities and Dietary recommendations Goals    . Reduce portion size       Immunization History  Administered Date(s) Administered  . Influenza,inj,Quad  PF,6+ Mos 05/03/2018  . MMR 12/24/1998  . Td 03/04/2005  . Tdap 04/14/2017  . Zoster Recombinat (Shingrix) 01/12/2019    Health Maintenance  Topic Date Due  . COVID-19 Vaccine (1) Never done  . INFLUENZA VACCINE  01/06/2020  . MAMMOGRAM  04/29/2021  . PAP SMEAR-Modifier  05/03/2021  . COLONOSCOPY  04/12/2024  . TETANUS/TDAP  04/15/2027  . Hepatitis C Screening  Completed  . HIV Screening  Discontinued    Discussed health benefits of physical activity, and encouraged her to engage in regular exercise appropriate for her age and condition.  1. Annual  physical exam Normal physical exam today. Will check labs as below and f/u pending lab results. If labs are stable and WNL she will not need to have these rechecked for one year at her next annual physical exam. She is to call the office in the meantime if she has any acute issue, questions or concerns.  2. Abnormal glucose H/O this and obese. Will check labs as below and f/u pending results. - CBC with Differential/Platelet - Comprehensive metabolic panel - Lipid panel - Hemoglobin A1c  3. Vitamin D deficiency H/O this and post menopausal. Will check labs as below and f/u pending results. - Vitamin D (25 hydroxy)  4. Morbid obesity ([place]) Counseled patient on healthy lifestyle modifications including dieting and exercise.  - CBC with Differential/Platelet - Comprehensive metabolic panel - Lipid panel - Hemoglobin A1c  5. Right low back pain, unspecified chronicity, unspecified whether sciatica present Stable. Diagnosis pulled for medication refill. Continue current medical treatment plan. - cyclobenzaprine (FLEXERIL) 5 MG tablet; Take 1 tablet (5 mg total) by mouth 3 (three) times daily as needed for muscle spasms.  Dispense: 90 tablet; Refill: 1  6. Muscle spasm Stable. Diagnosis pulled for medication refill. Continue current medical treatment plan. - cyclobenzaprine (FLEXERIL) 5 MG tablet; Take 1 tablet (5 mg total) by mouth 3 (three) times daily as needed for muscle spasms.  Dispense: 90 tablet; Refill: 1  7. Temporal mandibular joint disorder Stable. Diagnosis pulled for medication refill. Continue current medical treatment plan. - etodolac (LODINE) 400 MG tablet; Take 1 tablet (400 mg total) by mouth 2 (two) times daily as needed.  Dispense: 180 tablet; Refill: 1  8. Seasonal allergic rhinitis due to pollen Stable. Diagnosis pulled for medication refill. Continue current medical treatment plan. - fluticasone (FLONASE) 50 MCG/ACT nasal spray; Use 2 spray(s) in each nostril once  daily  Dispense: 48 g; Refill: 1  9. Essential hypertension Stable. Diagnosis pulled for medication refill. Continue current medical treatment plan. Will check labs as below and f/u pending results. - CBC with Differential/Platelet - Comprehensive metabolic panel - Lipid panel - Hemoglobin A1c - metoprolol succinate (TOPROL-XL) 25 MG 24 hr tablet; Take 2 tablets (50 mg total) by mouth daily.  Dispense: 180 tablet; Refill: 1 - triamterene-hydrochlorothiazide (MAXZIDE-25) 37.5-25 MG tablet; Take 1 tablet by mouth daily.  Dispense: 90 tablet; Refill: 1  10. Gastroesophageal reflux disease with esophagitis without hemorrhage Stable. Diagnosis pulled for medication refill. Continue current medical treatment plan. Will check labs as below and f/u pending results. - CBC with Differential/Platelet - Comprehensive metabolic panel - Lipid panel - pantoprazole (PROTONIX) 40 MG tablet; Take 1 tablet (40 mg total) by mouth 2 (two) times daily.  Dispense: 180 tablet; Refill: 1  11. Nausea Stable. Diagnosis pulled for medication refill. Continue current medical treatment plan. - promethazine (PHENERGAN) 12.5 MG tablet; TAKE 1 TABLET BY MOUTH EVERY 8 HOURS AS NEEDED  FOR NAUSEA AND VOMITING  Dispense: 20 tablet; Refill: 1  12. Situational mixed anxiety and depressive disorder Situational, mostly work or family stressors. Using coping mechanisms to handle.  13. Colon cancer screening Due for repeat colonoscopy. Referral placed.  - Ambulatory referral to Gastroenterology  14. Thyroid disorder screen Will check labs as below and f/u pending results. - TSH   No follow-ups on file.     Reynolds Bowl, PA-C, have reviewed all documentation for this visit. The documentation on 10/17/19 for the exam, diagnosis, procedures, and orders are all accurate and complete.   Rubye Beach  [facility] [phone] (phone) [phone] (fax)  [place]

## 2019-10-13 LAB — COMPREHENSIVE METABOLIC PANEL
ALT: 17 IU/L (ref 0–32)
AST: 18 IU/L (ref 0–40)
Albumin/Globulin Ratio: 1.4 (ref 1.2–2.2)
Albumin: 4.1 g/dL (ref 3.8–4.9)
Alkaline Phosphatase: 102 IU/L (ref 39–117)
BUN/Creatinine Ratio: 41 — ABNORMAL HIGH (ref 9–23)
BUN: 22 mg/dL (ref 6–24)
Bilirubin Total: 0.3 mg/dL (ref 0.0–1.2)
CO2: 25 mmol/L (ref 20–29)
Calcium: 9.6 mg/dL (ref 8.7–10.2)
Chloride: 101 mmol/L (ref 96–106)
Creatinine, Ser: 0.54 mg/dL — ABNORMAL LOW (ref 0.57–1.00)
GFR calc Af Amer: 122 mL/min/{1.73_m2} (ref >59)
GFR calc non Af Amer: 106 mL/min/{1.73_m2} (ref >59)
Globulin, Total: 3 g/dL (ref 1.5–4.5)
Glucose: 93 mg/dL (ref 65–99)
Potassium: 3.9 mmol/L (ref 3.5–5.2)
Sodium: 139 mmol/L (ref 134–144)
Total Protein: 7.1 g/dL (ref 6.0–8.5)

## 2019-10-13 LAB — LIPID PANEL
Chol/HDL Ratio: 2.8 ratio (ref 0.0–4.4)
Cholesterol, Total: 154 mg/dL (ref 100–199)
HDL: 55 mg/dL (ref >39)
LDL Chol Calc (NIH): 82 mg/dL (ref 0–99)
Triglycerides: 92 mg/dL (ref 0–149)
VLDL Cholesterol Cal: 17 mg/dL (ref 5–40)

## 2019-10-13 LAB — CBC WITH DIFFERENTIAL/PLATELET
Basophils Absolute: 0.1 10*3/uL (ref 0.0–0.2)
Basos: 1 %
EOS (ABSOLUTE): 0.1 10*3/uL (ref 0.0–0.4)
Eos: 1 %
Hematocrit: 44.5 % (ref 34.0–46.6)
Hemoglobin: 15.3 g/dL (ref 11.1–15.9)
Immature Grans (Abs): 0 10*3/uL (ref 0.0–0.1)
Immature Granulocytes: 0 %
Lymphocytes Absolute: 3.1 10*3/uL (ref 0.7–3.1)
Lymphs: 29 %
MCH: 31.3 pg (ref 26.6–33.0)
MCHC: 34.4 g/dL (ref 31.5–35.7)
MCV: 91 fL (ref 79–97)
Monocytes Absolute: 0.8 10*3/uL (ref 0.1–0.9)
Monocytes: 8 %
Neutrophils Absolute: 6.4 10*3/uL (ref 1.4–7.0)
Neutrophils: 61 %
Platelets: 253 10*3/uL (ref 150–450)
RBC: 4.89 x10E6/uL (ref 3.77–5.28)
RDW: 12.6 % (ref 11.7–15.4)
WBC: 10.4 10*3/uL (ref 3.4–10.8)

## 2019-10-13 LAB — HEMOGLOBIN A1C
Est. average glucose Bld gHb Est-mCnc: 108 mg/dL
Hgb A1c MFr Bld: 5.4 % (ref 4.8–5.6)

## 2019-10-13 LAB — TSH: TSH: 0.692 u[IU]/mL (ref 0.450–4.500)

## 2019-10-13 LAB — VITAMIN D 25 HYDROXY (VIT D DEFICIENCY, FRACTURES): Vit D, 25-Hydroxy: 65 ng/mL (ref 30.0–100.0)

## 2019-10-15 ENCOUNTER — Telehealth: Payer: Self-pay

## 2019-10-15 NOTE — Telephone Encounter (Signed)
[employer]   Result Notes and Comments to Patient Comment seen by patient Mandy Baldwin on 10/15/2019 10:42 AM

## 2019-10-15 NOTE — Telephone Encounter (Signed)
-----   Message from Margaretann Loveless, [place] sent at 10/15/2019 10:28 AM EDT ----- Blood count is normal. Kidney and liver enzymes are normal. Sodium, potassium and calcium are normal. Cholesterol is normal. Thyroid is normal. Sugar is normal. Vit D is normal.

## 2019-10-17 ENCOUNTER — Encounter: Payer: Self-pay | Admitting: Physician Assistant

## 2019-10-23 ENCOUNTER — Telehealth (INDEPENDENT_AMBULATORY_CARE_PROVIDER_SITE_OTHER): Payer: Self-pay | Admitting: Gastroenterology

## 2019-10-23 ENCOUNTER — Other Ambulatory Visit: Payer: Self-pay

## 2019-10-23 DIAGNOSIS — Z86010 Personal history of colonic polyps: Secondary | ICD-10-CM

## 2019-10-23 NOTE — Progress Notes (Signed)
Gastroenterology Pre-Procedure Review  Request Date: Friday 02/15/20 Requesting Physician: Dr. Servando Snare  PATIENT REVIEW QUESTIONS: The patient responded to the following health history questions as indicated:    1. Are you having any GI issues? no 2. Do you have a personal history of Polyps? yes (2015 Dr. Servando Snare) 3. Do you have a family history of Colon Cancer or Polyps? yes (mom colon polyps) 4. Diabetes Mellitus? no 5. Joint replacements in the past 12 months?no 6. Major health problems in the past 3 months?no 7. Any artificial heart valves, MVP, or defibrillator?no    MEDICATIONS & ALLERGIES:    Patient reports the following regarding taking any anticoagulation/antiplatelet therapy:   Plavix, Coumadin, Eliquis, Xarelto, Lovenox, Pradaxa, Brilinta, or Effient? no Aspirin? yes (81 mg daily)  Patient confirms/reports the following medications:  Current Outpatient Medications  Medication Sig Dispense Refill  . aspirin (ASPIRIN LOW DOSE) 81 MG tablet Take 81 mg by mouth daily.    Marland Kitchen atorvastatin (LIPITOR) 20 MG tablet Take by mouth.    . cholecalciferol (VITAMIN D) 1000 UNITS tablet Take 2,000 Units by mouth daily.    . cyclobenzaprine (FLEXERIL) 5 MG tablet Take 1 tablet (5 mg total) by mouth 3 (three) times daily as needed for muscle spasms. 90 tablet 1  . etodolac (LODINE) 400 MG tablet Take 1 tablet (400 mg total) by mouth 2 (two) times daily as needed. 180 tablet 1  . fluticasone (FLONASE) 50 MCG/ACT nasal spray Use 2 spray(s) in each nostril once daily 48 g 1  . magnesium oxide (MAG-OX) 400 MG tablet Take 400 mg by mouth daily.    . metoprolol succinate (TOPROL-XL) 25 MG 24 hr tablet Take 2 tablets (50 mg total) by mouth daily. 180 tablet 1  . Multiple Vitamins-Minerals (MULTIVITAL PO) Take 1 tablet by mouth daily.    . pantoprazole (PROTONIX) 40 MG tablet Take 1 tablet (40 mg total) by mouth 2 (two) times daily. 180 tablet 1  . promethazine (PHENERGAN) 12.5 MG tablet TAKE 1 TABLET BY  MOUTH EVERY 8 HOURS AS NEEDED FOR NAUSEA AND VOMITING 20 tablet 1  . triamcinolone cream (KENALOG) 0.1 % APPLY TOPICALLY TWICE DAILY 30 g 0  . triamterene-hydrochlorothiazide (MAXZIDE-25) 37.5-25 MG tablet Take 1 tablet by mouth daily. 90 tablet 1   No current facility-administered medications for this visit.    Patient confirms/reports the following allergies:  Allergies  Allergen Reactions  . Tape Rash    Some bandaids cause rash    No orders of the defined types were placed in this encounter.   AUTHORIZATION INFORMATION Primary Insurance: 1D#: Group #:  Secondary Insurance: 1D#: Group #:  SCHEDULE INFORMATION: Date: 02/15/20 Time: Location:[facility]

## 2019-10-31 ENCOUNTER — Encounter: Payer: Self-pay | Admitting: Physician Assistant

## 2019-11-01 ENCOUNTER — Encounter: Payer: Self-pay | Admitting: Family Medicine

## 2019-11-01 ENCOUNTER — Other Ambulatory Visit: Payer: Self-pay

## 2019-11-01 ENCOUNTER — Ambulatory Visit (INDEPENDENT_AMBULATORY_CARE_PROVIDER_SITE_OTHER): Payer: 59 | Admitting: Family Medicine

## 2019-11-01 DIAGNOSIS — J069 Acute upper respiratory infection, unspecified: Secondary | ICD-10-CM

## 2019-11-01 NOTE — Patient Instructions (Signed)

## 2019-11-01 NOTE — Progress Notes (Signed)
Virtual telephone visit    Virtual Visit via Telephone Note   This visit type was conducted due to national recommendations for restrictions regarding the COVID-19 Pandemic (e.g. social distancing) in an effort to limit this patient's exposure and mitigate transmission in our community. Due to her co-morbid illnesses, this patient is at least at moderate risk for complications without adequate follow up. This format is felt to be most appropriate for this patient at this time. The patient did not have access to video technology or had technical difficulties with video requiring transitioning to audio format only (telephone). Physical exam was limited to content and character of the telephone converstion.     Patient location: home Provider location: [place] involved in the visit: patient, provider   Visit Date: 11/01/2019  Today's healthcare provider: Lavon Paganini, MD   Chief Complaint  Patient presents with  . URI   Subjective    URI  This is a new problem. The current episode started in the past 7 days (Started on Monday). The problem has been unchanged. There has been no fever. Associated symptoms include congestion, coughing, headaches, sinus pain, sneezing and a sore throat. Pertinent negatives include no chest pain, ear pain, plugged ear sensation or wheezing. She has tried decongestant (Flonase and Mucinex) for the symptoms. The treatment provided no relief.   Taking antihistamine Fully vaccinated for COVID 19    Social History   Tobacco Use  . Smoking status: Never Smoker  . Smokeless tobacco: Never Used  Substance Use Topics  . Alcohol use: No  . Drug use: No      Medications: Outpatient Medications Prior to Visit  Medication Sig  . aspirin (ASPIRIN LOW DOSE) 81 MG tablet Take 81 mg by mouth daily.  Marland Kitchen atorvastatin (LIPITOR) 20 MG tablet Take by mouth.  . cholecalciferol (VITAMIN D) 1000 UNITS tablet Take 2,000 Units by  mouth daily.  . cyclobenzaprine (FLEXERIL) 5 MG tablet Take 1 tablet (5 mg total) by mouth 3 (three) times daily as needed for muscle spasms.  Marland Kitchen etodolac (LODINE) 400 MG tablet Take 1 tablet (400 mg total) by mouth 2 (two) times daily as needed.  . fluticasone (FLONASE) 50 MCG/ACT nasal spray Use 2 spray(s) in each nostril once daily  . magnesium oxide (MAG-OX) 400 MG tablet Take 400 mg by mouth daily.  . metoprolol succinate (TOPROL-XL) 25 MG 24 hr tablet Take 2 tablets (50 mg total) by mouth daily.  . Multiple Vitamins-Minerals (MULTIVITAL PO) Take 1 tablet by mouth daily.  . pantoprazole (PROTONIX) 40 MG tablet Take 1 tablet (40 mg total) by mouth 2 (two) times daily.  . promethazine (PHENERGAN) 12.5 MG tablet TAKE 1 TABLET BY MOUTH EVERY 8 HOURS AS NEEDED FOR NAUSEA AND VOMITING  . triamcinolone cream (KENALOG) 0.1 % APPLY TOPICALLY TWICE DAILY  . triamterene-hydrochlorothiazide (MAXZIDE-25) 37.5-25 MG tablet Take 1 tablet by mouth daily.   No facility-administered medications prior to visit.    Review of Systems  Constitutional: Negative for fever.  HENT: Positive for congestion, postnasal drip, sinus pressure, sinus pain, sneezing and sore throat. Negative for ear pain.   Respiratory: Positive for cough. Negative for chest tightness, shortness of breath and wheezing.   Cardiovascular: Negative for chest pain and palpitations.  Neurological: Positive for headaches.      Objective    There were no vitals taken for this visit.   Speaking in full sentences in NAD   Assessment & Plan  1. Viral URI - symptoms c/w viral URI - no evidence of strep pharyngitis, CAP, AOM, bacterial sinusitis, or other bacterial infection - if symptoms persist past 7-10 days, could consider abx for possible bacterial sinusitis - consider COVID testing - discussed symptomatic management, natural course, and return precautions    Return if symptoms worsen or fail to improve.    I discussed  the assessment and treatment plan with the patient. The patient was provided an opportunity to ask questions and all were answered. The patient agreed with the plan and demonstrated an understanding of the instructions.   The patient was advised to call back or seek an in-person evaluation if the symptoms worsen or if the condition fails to improve as anticipated.  I provided 12 minutes of non-face-to-face time during this encounter.  I, Shirlee Latch, MD, have reviewed all documentation for this visit. The documentation on 11/01/19 for the exam, diagnosis, procedures, and orders are all accurate and complete.   Riti Rollyson, Marzella Schlein, MD, MPH [facility]

## 2019-11-05 ENCOUNTER — Encounter: Payer: Self-pay | Admitting: Family Medicine

## 2019-11-06 MED ORDER — AMOXICILLIN-POT CLAVULANATE 875-125 MG PO TABS: 1.0000 | ORAL_TABLET | Freq: Two times a day (BID) | ORAL | 0 refills | Status: AC

## 2020-02-04 ENCOUNTER — Telehealth: Payer: Self-pay

## 2020-02-04 NOTE — Telephone Encounter (Signed)
Returned patients call to reschedule colonoscopy with Dr. Servando Snare at [facility].  LVM for her to call me back to reschedule her procedure.  Thanks,  [place], [place]

## 2020-02-07 ENCOUNTER — Other Ambulatory Visit: Payer: Self-pay

## 2020-02-07 ENCOUNTER — Encounter: Payer: Self-pay | Admitting: Gastroenterology

## 2020-02-13 ENCOUNTER — Other Ambulatory Visit
Admission: RE | Admit: 2020-02-13 | Discharge: 2020-02-13 | Disposition: A | Discharge status: Home / Self Care | Payer: 59 | Source: Ambulatory Visit | Attending: Gastroenterology | Admitting: Gastroenterology

## 2020-02-13 ENCOUNTER — Other Ambulatory Visit: Payer: Self-pay

## 2020-02-13 DIAGNOSIS — Z01812 Encounter for preprocedural laboratory examination: Secondary | ICD-10-CM | POA: Insufficient documentation

## 2020-02-13 DIAGNOSIS — Z20822 Contact with and (suspected) exposure to covid-19: Secondary | ICD-10-CM | POA: Insufficient documentation

## 2020-02-13 LAB — SARS CORONAVIRUS 2 (TAT 6-24 HRS): SARS Coronavirus 2: NEGATIVE

## 2020-02-14 NOTE — Discharge Instructions (Signed)
General Anesthesia, Adult, Care After This sheet gives you information about how to care for yourself after your procedure. Your health care provider may also give you more specific instructions. If you have problems or questions, contact your health care provider. What can I expect after the procedure? After the procedure, the following side effects are common:  Pain or discomfort at the IV site.  Nausea.  Vomiting.  Sore throat.  Trouble concentrating.  Feeling cold or chills.  Weak or tired.  Sleepiness and fatigue.  Soreness and body aches. These side effects can affect parts of the body that were not involved in surgery. Follow these instructions at home:  For at least 24 hours after the procedure:  Have a responsible adult stay with you. It is important to have someone help care for you until you are awake and alert.  Rest as needed.  Do not: ? Participate in activities in which you could fall or become injured. ? Drive. ? Use heavy machinery. ? Drink alcohol. ? Take sleeping pills or medicines that cause drowsiness. ? Make important decisions or sign legal documents. ? Take care of children on your own. Eating and drinking  Follow any instructions from your health care provider about eating or drinking restrictions.  When you feel hungry, start by eating small amounts of foods that are soft and easy to digest (bland), such as toast. Gradually return to your regular diet.  Drink enough fluid to keep your urine pale yellow.  If you vomit, rehydrate by drinking water, juice, or clear broth. General instructions  If you have sleep apnea, surgery and certain medicines can increase your risk for breathing problems. Follow instructions from your health care provider about wearing your sleep device: ? Anytime you are sleeping, including during daytime naps. ? While taking prescription pain medicines, sleeping medicines, or medicines that make you drowsy.  Return to  your normal activities as told by your health care provider. Ask your health care provider what activities are safe for you.  Take over-the-counter and prescription medicines only as told by your health care provider.  If you smoke, do not smoke without supervision.  Keep all follow-up visits as told by your health care provider. This is important. Contact a health care provider if:  You have nausea or vomiting that does not get better with medicine.  You cannot eat or drink without vomiting.  You have pain that does not get better with medicine.  You are unable to pass urine.  You develop a skin rash.  You have a fever.  You have redness around your IV site that gets worse. Get help right away if:  You have difficulty breathing.  You have chest pain.  You have blood in your urine or stool, or you vomit blood. Summary  After the procedure, it is common to have a sore throat or nausea. It is also common to feel tired.  Have a responsible adult stay with you for the first 24 hours after general anesthesia. It is important to have someone help care for you until you are awake and alert.  When you feel hungry, start by eating small amounts of foods that are soft and easy to digest (bland), such as toast. Gradually return to your regular diet.  Drink enough fluid to keep your urine pale yellow.  Return to your normal activities as told by your health care provider. Ask your health care provider what activities are safe for you. This information is not   intended to replace advice given to you by your health care provider. Make sure you discuss any questions you have with your health care provider. Document Revised: 05/27/2017 Document Reviewed: 01/07/2017 Elsevier Patient Education  2020 Elsevier Inc.  

## 2020-02-15 ENCOUNTER — Ambulatory Visit
Admission: RE | Admit: 2020-02-15 | Discharge: 2020-02-15 | Disposition: A | Discharge status: Home / Self Care | Payer: 59 | Attending: Gastroenterology | Admitting: Gastroenterology

## 2020-02-15 ENCOUNTER — Ambulatory Visit: Payer: 59 | Admitting: Anesthesiology

## 2020-02-15 ENCOUNTER — Other Ambulatory Visit: Payer: Self-pay

## 2020-02-15 ENCOUNTER — Encounter
Admission: RE | Disposition: A | Discharge status: Home / Self Care | Payer: Self-pay | Source: Home / Self Care | Attending: Gastroenterology

## 2020-02-15 ENCOUNTER — Encounter: Payer: Self-pay | Admitting: Gastroenterology

## 2020-02-15 DIAGNOSIS — Z860100 Personal history of colon polyps, unspecified: Secondary | ICD-10-CM

## 2020-02-15 DIAGNOSIS — M797 Fibromyalgia: Secondary | ICD-10-CM | POA: Insufficient documentation

## 2020-02-15 DIAGNOSIS — Z6841 Body Mass Index (BMI) 40.0 and over, adult: Secondary | ICD-10-CM | POA: Insufficient documentation

## 2020-02-15 DIAGNOSIS — Z825 Family history of asthma and other chronic lower respiratory diseases: Secondary | ICD-10-CM | POA: Insufficient documentation

## 2020-02-15 DIAGNOSIS — Z803 Family history of malignant neoplasm of breast: Secondary | ICD-10-CM | POA: Insufficient documentation

## 2020-02-15 DIAGNOSIS — I10 Essential (primary) hypertension: Secondary | ICD-10-CM | POA: Insufficient documentation

## 2020-02-15 DIAGNOSIS — K219 Gastro-esophageal reflux disease without esophagitis: Secondary | ICD-10-CM | POA: Insufficient documentation

## 2020-02-15 DIAGNOSIS — Z820 Family history of epilepsy and other diseases of the nervous system: Secondary | ICD-10-CM | POA: Insufficient documentation

## 2020-02-15 DIAGNOSIS — Z8249 Family history of ischemic heart disease and other diseases of the circulatory system: Secondary | ICD-10-CM | POA: Insufficient documentation

## 2020-02-15 DIAGNOSIS — Z1211 Encounter for screening for malignant neoplasm of colon: Secondary | ICD-10-CM | POA: Diagnosis not present

## 2020-02-15 DIAGNOSIS — Z91048 Other nonmedicinal substance allergy status: Secondary | ICD-10-CM | POA: Diagnosis not present

## 2020-02-15 DIAGNOSIS — R519 Headache, unspecified: Secondary | ICD-10-CM | POA: Insufficient documentation

## 2020-02-15 DIAGNOSIS — Z7982 Long term (current) use of aspirin: Secondary | ICD-10-CM | POA: Insufficient documentation

## 2020-02-15 DIAGNOSIS — M26609 Unspecified temporomandibular joint disorder, unspecified side: Secondary | ICD-10-CM | POA: Insufficient documentation

## 2020-02-15 DIAGNOSIS — M170 Bilateral primary osteoarthritis of knee: Secondary | ICD-10-CM | POA: Insufficient documentation

## 2020-02-15 DIAGNOSIS — E785 Hyperlipidemia, unspecified: Secondary | ICD-10-CM | POA: Insufficient documentation

## 2020-02-15 DIAGNOSIS — Z79899 Other long term (current) drug therapy: Secondary | ICD-10-CM | POA: Insufficient documentation

## 2020-02-15 HISTORY — PX: COLONOSCOPY WITH PROPOFOL: SHX5780

## 2020-02-15 HISTORY — DX: Pain in unspecified knee: M25.569

## 2020-02-15 SURGERY — COLONOSCOPY WITH PROPOFOL
Anesthesia: General | Site: Rectum

## 2020-02-15 MED ORDER — LACTATED RINGERS IV SOLN: INTRAVENOUS | Status: DC

## 2020-02-15 MED ORDER — PROPOFOL 10 MG/ML IV BOLUS
INTRAVENOUS | Status: DC | PRN
  Administered 2020-02-15: 100 mg via INTRAVENOUS
  Administered 2020-02-15 (×2): 50 mg via INTRAVENOUS

## 2020-02-15 MED ORDER — SODIUM CHLORIDE 0.9 % IV SOLN: INTRAVENOUS | Status: DC

## 2020-02-15 MED ORDER — LIDOCAINE HCL (CARDIAC) PF 100 MG/5ML IV SOSY
PREFILLED_SYRINGE | INTRAVENOUS | Status: DC | PRN
  Administered 2020-02-15: 50 mg via INTRAVENOUS

## 2020-02-15 MED ORDER — STERILE WATER FOR IRRIGATION IR SOLN
Status: DC | PRN
  Administered 2020-02-15: .05 mL

## 2020-02-15 SURGICAL SUPPLY — 5 items
GOWN CVR UNV OPN BCK APRN NK (MISCELLANEOUS) ×2 IMPLANT
GOWN ISOL THUMB LOOP REG UNIV (MISCELLANEOUS) ×4
KIT ENDO PROCEDURE OLY (KITS) ×2 IMPLANT
MANIFOLD NEPTUNE II (INSTRUMENTS) ×2 IMPLANT
WATER STERILE IRR 250ML POUR (IV SOLUTION) ×2 IMPLANT

## 2020-02-15 NOTE — Anesthesia Procedure Notes (Signed)
Procedure Name: MAC Date/Time: 02/15/2020 7:39 AM Performed by: Jeannene Patella, CRNA Pre-anesthesia Checklist: Patient identified, Emergency Drugs available, Suction available, Timeout performed and Patient being monitored Patient Re-evaluated:Patient Re-evaluated prior to induction Oxygen Delivery Method: Nasal cannula Placement Confirmation: positive ETCO2

## 2020-02-15 NOTE — Anesthesia Preprocedure Evaluation (Signed)
Anesthesia Evaluation  Patient identified by MRN, date of birth, ID band Patient awake    Reviewed: Allergy & Precautions, NPO status   Airway Mallampati: II  TM Distance: >3 FB     Dental   Pulmonary    breath sounds clear to auscultation       Cardiovascular hypertension,  Rhythm:Regular Rate:Normal  HLD   Neuro/Psych  Headaches,    GI/Hepatic GERD  ,  Endo/Other  Morbid obesity  Renal/GU      Musculoskeletal  (+) Arthritis , Osteoarthritis,  Fibromyalgia -  Abdominal   Peds  Hematology   Anesthesia Other Findings   Reproductive/Obstetrics                             Anesthesia Physical Anesthesia Plan  ASA: III  Anesthesia Plan: General   Post-op Pain Management:    Induction: Intravenous  PONV Risk Score and Plan: Propofol infusion, TIVA and Treatment may vary due to age or medical condition  Airway Management Planned: Natural Airway and Nasal Cannula  Additional Equipment:   Intra-op Plan:   Post-operative Plan:   Informed Consent: I have reviewed the patients History and Physical, chart, labs and discussed the procedure including the risks, benefits and alternatives for the proposed anesthesia with the patient or authorized representative who has indicated his/her understanding and acceptance.       Plan Discussed with: CRNA  Anesthesia Plan Comments:         Anesthesia Quick Evaluation

## 2020-02-15 NOTE — Op Note (Signed)
[facility] Gastroenterology Patient Name: Mandy Baldwin Procedure Date: 02/15/2020 7:28 AM MRN: [id number] Account #: [id number] Date of Birth: [birth year] Admit Type: Outpatient Age: 59 Room: [facility] OR ROOM 01 Gender: Female Note Status: Finalized Procedure:             Colonoscopy Indications:           High risk colon cancer surveillance: Personal history                         of colonic polyps Providers:             [name] MD, MD Referring MD:          Margaretann Loveless (Referring MD) Medicines:             Propofol per Anesthesia Complications:         No immediate complications. Procedure:             Pre-Anesthesia Assessment:                        - Prior to the procedure, a History and Physical was                         performed, and patient medications and allergies were                         reviewed. The patient's tolerance of previous                         anesthesia was also reviewed. The risks and benefits                         of the procedure and the sedation options and risks                         were discussed with the patient. All questions were                         answered, and informed consent was obtained. Prior                         Anticoagulants: The patient has taken no previous                         anticoagulant or antiplatelet agents. ASA Grade                         Assessment: II - A patient with mild systemic disease.                         After reviewing the risks and benefits, the patient                         was deemed in satisfactory condition to undergo the                         procedure.  After obtaining informed consent, the colonoscope was                         passed under direct vision. Throughout the procedure,                         the patient's blood pressure, pulse, and oxygen                         saturations were monitored continuously. The                          Colonoscope was introduced through the anus and                         advanced to the the cecum, identified by appendiceal                         orifice and ileocecal valve. The colonoscopy was                         performed without difficulty. The patient tolerated                         the procedure well. The quality of the bowel                         preparation was excellent. Findings:      The perianal and digital rectal examinations were normal.      The colon (entire examined portion) appeared normal. Impression:            - The entire examined colon is normal.                        - No specimens collected. Recommendation:        - Discharge patient to home.                        - Resume previous diet.                        - Continue present medications.                        - Repeat colonoscopy in 7 years for surveillance. Procedure Code(s):     --- Professional ---                        [phone], Colonoscopy, flexible; diagnostic, including                         collection of specimen(s) by brushing or washing, when                         performed (separate procedure) Diagnosis Code(s):     --- Professional ---                        Z86.010, Personal history of colonic polyps CPT copyright 2019 American Medical Association. All rights reserved. The codes documented in this  report are preliminary and upon coder review may  be revised to meet current compliance requirements. [name] MD, MD 02/15/2020 7:52:24 AM This report has been signed electronically. Number of Addenda: 0 Note Initiated On: 02/15/2020 7:28 AM Scope Withdrawal Time: 0 hours 8 minutes 39 seconds  Total Procedure Duration: 0 hours 10 minutes 39 seconds  Estimated Blood Loss:  Estimated blood loss: none.      [facility]

## 2020-02-15 NOTE — Transfer of Care (Signed)
Immediate Anesthesia Transfer of Care Note  Patient: Mandy Baldwin  Procedure(s) Performed: COLONOSCOPY WITH PROPOFOL (N/A Rectum)  Patient Location: PACU  Anesthesia Type: General  Level of Consciousness: awake, alert  and patient cooperative  Airway and Oxygen Therapy: Patient Spontanous Breathing and Patient connected to supplemental oxygen  Post-op Assessment: Post-op Vital signs reviewed, Patient's Cardiovascular Status Stable, Respiratory Function Stable, Patent Airway and No signs of Nausea or vomiting  Post-op Vital Signs: Reviewed and stable  Complications: No complications documented.

## 2020-02-15 NOTE — Anesthesia Postprocedure Evaluation (Signed)
Anesthesia Post Note  Patient: Mandy Baldwin  Procedure(s) Performed: COLONOSCOPY WITH PROPOFOL (N/A Rectum)     Patient location during evaluation: PACU Anesthesia Type: General Level of consciousness: awake Pain management: pain level controlled Vital Signs Assessment: post-procedure vital signs reviewed and stable Respiratory status: respiratory function stable Cardiovascular status: stable Postop Assessment: no signs of nausea or vomiting Anesthetic complications: no   No complications documented.  Jola Babinski

## 2020-02-15 NOTE — H&P (Signed)
Mandy Minium, MD [facility] [address], [address] [place], [place] [address] Phone:[phone] Fax : [phone]  Primary Care Physician:  Margaretann Loveless, PA-C Primary Gastroenterologist:  Dr. Servando Snare  Pre-Procedure History & Physical: HPI:  Mandy Baldwin is a 59 y.o. female is here for an colonoscopy.   Past Medical History:  Diagnosis Date  . GERD (gastroesophageal reflux disease)   . Hyperlipidemia   . Hypertension   . Knee pain    right knee, bone on bone  . Motion sickness    [place]  . Osteoarthritis of both knees   . Shingles 12/14/2014  . TMJ (dislocation of temporomandibular joint)     Past Surgical History:  Procedure Laterality Date  . COLONOSCOPY    . ESOPHAGOGASTRODUODENOSCOPY (EGD) WITH PROPOFOL N/A 09/18/2015   Procedure: ESOPHAGOGASTRODUODENOSCOPY (EGD) WITH PROPOFOL with ballon dilatation;  Surgeon: Mandy Minium, MD;  Location: [facility];  Service: Endoscopy;  Laterality: N/A;  . LAPAROSCOPIC OVARIAN      Prior to Admission medications   Medication Sig Start Date End Date Taking? Authorizing Provider  aspirin (ASPIRIN LOW DOSE) 81 MG tablet Take 81 mg by mouth daily.   Yes [provider]  atorvastatin (LIPITOR) 20 MG tablet Take by mouth daily. pm 08/28/19 08/27/20 Yes [provider]  cholecalciferol (VITAMIN D) 1000 UNITS tablet Take 2,000 Units by mouth daily.   Yes [provider]  etodolac (LODINE) 400 MG tablet Take 1 tablet (400 mg total) by mouth 2 (two) times daily as needed. 10/12/19  Yes Joycelyn Man M, PA-C  fluticasone [facility]) 50 MCG/ACT nasal spray Use 2 spray(s) in each nostril once daily 10/12/19  Yes Burnette, Alessandra Bevels, PA-C  magnesium oxide (MAG-OX) 400 MG tablet Take 400 mg by mouth daily.   Yes [provider]  metoprolol succinate (TOPROL-XL) 25 MG 24 hr tablet Take 2 tablets (50 mg total) by mouth daily. Patient taking differently: Take 50 mg by mouth daily. pm 10/12/19  Yes  Burnette, Alessandra Bevels, PA-C  Multiple Vitamins-Minerals (MULTIVITAL PO) Take 1 tablet by mouth daily.   Yes [provider]  pantoprazole (PROTONIX) 40 MG tablet Take 1 tablet (40 mg total) by mouth 2 (two) times daily. 10/12/19  Yes Margaretann Loveless, PA-C  promethazine (PHENERGAN) 12.5 MG tablet TAKE 1 TABLET BY MOUTH EVERY 8 HOURS AS NEEDED FOR NAUSEA AND VOMITING 10/12/19  Yes Margaretann Loveless, PA-C  triamterene-hydrochlorothiazide (MAXZIDE-25) 37.5-25 MG tablet Take 1 tablet by mouth daily. Patient taking differently: Take 1 tablet by mouth daily. am 10/12/19  Yes Margaretann Loveless, PA-C  cyclobenzaprine (FLEXERIL) 5 MG tablet Take 1 tablet (5 mg total) by mouth 3 (three) times daily as needed for muscle spasms. Patient not taking: Reported on 02/07/2020 10/12/19   Margaretann Loveless, PA-C  triamcinolone cream (KENALOG) 0.1 % APPLY TOPICALLY TWICE DAILY Patient not taking: Reported on 02/07/2020 02/14/19   Margaretann Loveless, PA-C    Allergies as of 10/23/2019 - Review Complete 10/23/2019  Allergen Reaction Noted  . Tape Rash 09/12/2015    Family History  Problem Relation Age of Onset  . Dementia Mother   . Aortic aneurysm Mother   . COPD Father   . Breast cancer Sister 14  . COPD Brother     Social History   Socioeconomic History  . Marital status: Married    Spouse name: Lexys Milliner  . Number of children: 0  . Years of education: 57  . Highest education  level: Not on file  Occupational History  . Occupation: Clinical biochemist    Comment: Production manager: [employer]  Tobacco Use  . Smoking status: Never Smoker  . Smokeless tobacco: Never Used  Vaping Use  . Vaping Use: Never used  Substance and Sexual Activity  . Alcohol use: No  . Drug use: No  . Sexual activity: Not on file  Other Topics Concern  . Not on file  Social History Narrative  . Not on file   Social Determinants of Health   Financial Resource Strain:   . Difficulty of  Paying Living Expenses: Not on file  Food Insecurity:   . Worried About Programme researcher, broadcasting/film/video in the Last Year: Not on file  . Ran Out of Food in the Last Year: Not on file  Transportation Needs:   . Lack of Transportation (Medical): Not on file  . Lack of Transportation (Non-Medical): Not on file  Physical Activity:   . Days of Exercise per Week: Not on file  . Minutes of Exercise per Session: Not on file  Stress:   . Feeling of Stress : Not on file  Social Connections:   . Frequency of Communication with Friends and Family: Not on file  . Frequency of Social Gatherings with Friends and Family: Not on file  . Attends Religious Services: Not on file  . Active Member of Clubs or Organizations: Not on file  . Attends Banker Meetings: Not on file  . Marital Status: Not on file  Intimate Partner Violence:   . Fear of Current or Ex-Partner: Not on file  . Emotionally Abused: Not on file  . Physically Abused: Not on file  . Sexually Abused: Not on file    Review of Systems: See HPI, otherwise negative ROS  Physical Exam: BP (!) 134/93   Pulse 76   Temp (!) 97.5 F (36.4 C) (Temporal)   Resp 16   Ht 5\' 3"  (1.6 m)   Wt 129.3 kg   SpO2 95%   BMI 50.49 kg/m  General:   Alert,  pleasant and cooperative in NAD Head:  Normocephalic and atraumatic. Neck:  Supple; no masses or thyromegaly. Lungs:  Clear throughout to auscultation.    Heart:  Regular rate and rhythm. Abdomen:  Soft, nontender and nondistended. Normal bowel sounds, without guarding, and without rebound.   Neurologic:  Alert and  oriented x4;  grossly normal neurologically.  Impression/Plan: [name] is here for an colonoscopy to be performed for a history of adenomatous polyps on 04/2014  Risks, benefits, limitations, and alternatives regarding  colonoscopy have been reviewed with the patient.  Questions have been answered.  All parties agreeable.   05/2014, MD  02/15/2020, 7:18 AM

## 2020-03-19 ENCOUNTER — Other Ambulatory Visit: Payer: Self-pay

## 2020-03-19 ENCOUNTER — Ambulatory Visit (INDEPENDENT_AMBULATORY_CARE_PROVIDER_SITE_OTHER): Payer: 59

## 2020-03-19 DIAGNOSIS — Z23 Encounter for immunization: Secondary | ICD-10-CM

## 2020-03-25 ENCOUNTER — Other Ambulatory Visit: Payer: Self-pay | Admitting: Physician Assistant

## 2020-03-25 DIAGNOSIS — Z1231 Encounter for screening mammogram for malignant neoplasm of breast: Secondary | ICD-10-CM

## 2020-04-03 ENCOUNTER — Other Ambulatory Visit: Payer: Self-pay

## 2020-04-03 ENCOUNTER — Ambulatory Visit (INDEPENDENT_AMBULATORY_CARE_PROVIDER_SITE_OTHER): Payer: 59

## 2020-04-03 DIAGNOSIS — Z23 Encounter for immunization: Secondary | ICD-10-CM

## 2020-04-14 ENCOUNTER — Ambulatory Visit: Payer: Self-pay | Admitting: Physician Assistant

## 2020-04-20 ENCOUNTER — Other Ambulatory Visit: Payer: Self-pay | Admitting: Physician Assistant

## 2020-04-20 DIAGNOSIS — K2100 Gastro-esophageal reflux disease with esophagitis, without bleeding: Secondary | ICD-10-CM

## 2020-04-30 ENCOUNTER — Other Ambulatory Visit: Payer: Self-pay

## 2020-04-30 ENCOUNTER — Ambulatory Visit
Admission: RE | Admit: 2020-04-30 | Discharge: 2020-04-30 | Disposition: A | Discharge status: Home / Self Care | Payer: 59 | Source: Ambulatory Visit | Attending: Physician Assistant | Admitting: Physician Assistant

## 2020-04-30 DIAGNOSIS — Z1231 Encounter for screening mammogram for malignant neoplasm of breast: Secondary | ICD-10-CM | POA: Diagnosis not present

## 2020-05-20 ENCOUNTER — Encounter: Payer: Self-pay | Admitting: Physician Assistant

## 2020-05-21 ENCOUNTER — Encounter: Payer: Self-pay | Admitting: Physician Assistant

## 2020-05-21 ENCOUNTER — Telehealth (INDEPENDENT_AMBULATORY_CARE_PROVIDER_SITE_OTHER): Payer: 59 | Admitting: Physician Assistant

## 2020-05-21 DIAGNOSIS — J0140 Acute pansinusitis, unspecified: Secondary | ICD-10-CM | POA: Diagnosis not present

## 2020-05-21 DIAGNOSIS — B029 Zoster without complications: Secondary | ICD-10-CM

## 2020-05-21 MED ORDER — AMOXICILLIN-POT CLAVULANATE 875-125 MG PO TABS: 1.0000 | ORAL_TABLET | Freq: Two times a day (BID) | ORAL | 0 refills | Status: DC

## 2020-05-21 MED ORDER — VALACYCLOVIR HCL 1 G PO TABS: 1000.0000 mg | ORAL_TABLET | Freq: Three times a day (TID) | ORAL | 0 refills | Status: DC

## 2020-05-21 NOTE — Progress Notes (Signed)
Virtual telephone visit    Virtual Visit via Telephone Note   This visit type was conducted due to national recommendations for restrictions regarding the COVID-19 Pandemic (e.g. social distancing) in an effort to limit this patient's exposure and mitigate transmission in our community. Due to her co-morbid illnesses, this patient is at least at moderate risk for complications without adequate follow up. This format is felt to be most appropriate for this patient at this time. The patient did not have access to video technology or had technical difficulties with video requiring transitioning to audio format only (telephone). Physical exam was limited to content and character of the telephone converstion.    Patient location: Work Provider location: [facility]  I discussed the limitations of evaluation and management by telemedicine and the availability of in person appointments. The patient expressed understanding and agreed to proceed.   Visit Date: 05/21/2020  Today's healthcare provider: Margaretann Loveless, PA-C   Chief Complaint  Patient presents with   Sinus Problem   Rash   Subjective    HPI  Patient with c/o sinus congestion and pressure, runny nose,sore throat, cough. No fever. She has been taking Mucinex. No relief.This started on Saturday. She used her Flonase spray. She reports on Saturday and Sunday she coughed up green mucous that had some blood streaks in it as well. None since. Has had covid vaccines and booster.   She also has a rash on right leg. This appeared on Saturday. Reports that it is itching. She has had shingrix vaccination.  Patient Active Problem List   Diagnosis Date Noted   Personal history of colonic polyps    Bilateral carotid artery stenosis 08/06/2019   Precordial pain 08/06/2019   Fibromyalgia 01/03/2018   Abnormal ECG 08/10/2017   Benign essential HTN 08/10/2017   Palpitations 08/10/2017   History of shingles 04/14/2017   Family  history of abdominal aortic aneurysm (AAA) 04/14/2017   Hypercholesterolemia 04/14/2017   Heartburn    Stricture and stenosis of esophagus    Gastritis    Mass of right breast on mammogram 07/16/2015   GERD (gastroesophageal reflux disease) 07/14/2015   Internal hemorrhoid, bleeding 06/27/2015   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   BMI 50.0-59.9, adult (HCC) 02/07/2015   Gravida 0 02/07/2015   Abnormal glucose 02/07/2015   Morbid obesity (HCC) 12/14/2014   Irregular menses 12/14/2014   Vitamin D deficiency 12/14/2014   Ocular migraine 12/14/2014   Osteoarthritis of right knee 12/14/2014   Past Medical History:  Diagnosis Date   GERD (gastroesophageal reflux disease)    Hyperlipidemia    Hypertension    Knee pain    right knee, bone on bone   [place] sickness    [place]   Osteoarthritis of both knees    Shingles 12/14/2014   TMJ (dislocation of temporomandibular joint)       Medications: Outpatient Medications Prior to Visit  Medication Sig   aspirin 81 MG tablet Take 81 mg by mouth daily.   atorvastatin (LIPITOR) 20 MG tablet Take by mouth daily. pm   cholecalciferol (VITAMIN D) 1000 UNITS tablet Take 2,000 Units by mouth daily.   cyclobenzaprine (FLEXERIL) 5 MG tablet Take 1 tablet (5 mg total) by mouth 3 (three) times daily as needed for muscle spasms.   etodolac (LODINE) 400 MG tablet Take 1 tablet (400 mg total) by mouth 2 (two) times daily as needed.   fluticasone (FLONASE) 50 MCG/ACT nasal spray Use 2 spray(s) in each  nostril once daily   magnesium oxide (MAG-OX) 400 MG tablet Take 400 mg by mouth daily.   metoprolol succinate (TOPROL-XL) 25 MG 24 hr tablet Take 2 tablets (50 mg total) by mouth daily. (Patient taking differently: Take 50 mg by mouth daily. pm)   Multiple Vitamins-Minerals (MULTIVITAL PO) Take 1 tablet by mouth daily.   pantoprazole (PROTONIX) 40 MG tablet Take 1 tablet by mouth twice daily    promethazine (PHENERGAN) 12.5 MG tablet TAKE 1 TABLET BY MOUTH EVERY 8 HOURS AS NEEDED FOR NAUSEA AND VOMITING   triamterene-hydrochlorothiazide (MAXZIDE-25) 37.5-25 MG tablet Take 1 tablet by mouth daily. (Patient taking differently: Take 1 tablet by mouth daily. am)   triamcinolone cream (KENALOG) 0.1 % APPLY TOPICALLY TWICE DAILY (Patient not taking: No sig reported)   No facility-administered medications prior to visit.    Review of Systems  Constitutional: Positive for fatigue. Negative for chills and fever.  HENT: Positive for congestion, postnasal drip, rhinorrhea, sinus pain and sore throat. Negative for ear pain.   Respiratory: Positive for cough. Negative for chest tightness and shortness of breath.   Cardiovascular: Negative.   Gastrointestinal: Negative for abdominal pain and nausea.  Skin: Positive for rash.  Neurological: Positive for headaches. Negative for dizziness.    Last CBC Lab Results  Component Value Date   WBC 10.4 10/12/2019   HGB 15.3 10/12/2019   HCT 44.5 10/12/2019   MCV 91 10/12/2019   MCH 31.3 10/12/2019   RDW 12.6 10/12/2019   PLT 253 10/12/2019   Last metabolic panel Lab Results  Component Value Date   GLUCOSE 93 10/12/2019   NA 139 10/12/2019   K 3.9 10/12/2019   CL 101 10/12/2019   CO2 25 10/12/2019   BUN 22 10/12/2019   CREATININE 0.54 (L) 10/12/2019   GFRNONAA 106 10/12/2019   GFRAA 122 10/12/2019   CALCIUM 9.6 10/12/2019   PROT 7.1 10/12/2019   ALBUMIN 4.1 10/12/2019   LABGLOB 3.0 10/12/2019   AGRATIO 1.4 10/12/2019   BILITOT 0.3 10/12/2019   ALKPHOS 102 10/12/2019   AST 18 10/12/2019   ALT 17 10/12/2019   ANIONGAP 13 08/02/2019      Objective    There were no vitals taken for this visit. BP Readings from Last 3 Encounters:  02/15/20 139/87  10/12/19 (!) 146/80  08/09/19 126/82   Wt Readings from Last 3 Encounters:  02/15/20 285 lb (129.3 kg)  10/12/19 294 lb (133.4 kg)  08/09/19 292 lb (132.5 kg)         Assessment & Plan     1. Acute non-recurrent pansinusitis Worsening symptoms that have not responded to OTC medications. Will give augmentin as below. Continue allergy medications. Stay well hydrated and get plenty of rest. Call if no symptom improvement or if symptoms worsen. - amoxicillin-clavulanate (AUGMENTIN) 875-125 MG tablet; Take 1 tablet by mouth 2 (two) times daily.  Dispense: 20 tablet; Refill: 0  2. Herpes zoster without complication Possible shingles outbreak. Reports similar to previous shingles without pain, having more itching. She is vaccinated now against shingles where before she was not. Will use Valtrex as below.  - valACYclovir (VALTREX) 1000 MG tablet; Take 1 tablet (1,000 mg total) by mouth 3 (three) times daily.  Dispense: 21 tablet; Refill: 0   No follow-ups on file.    I discussed the assessment and treatment plan with the patient. The patient was provided an opportunity to ask questions and all were answered. The patient agreed with the plan and  demonstrated an understanding of the instructions.   The patient was advised to call back or seek an in-person evaluation if the symptoms worsen or if the condition fails to improve as anticipated.  I provided 12 minutes of non-face-to-face time during this encounter.  Delmer Islam, PA-C, have reviewed all documentation for this visit. The documentation on 05/21/20 for the exam, diagnosis, procedures, and orders are all accurate and complete.  Reine Just [facility] [phone] (phone) [phone] (fax)  [facility]

## 2020-05-21 NOTE — Patient Instructions (Signed)

## 2020-05-22 ENCOUNTER — Encounter: Payer: Self-pay | Admitting: Physician Assistant

## 2020-05-23 NOTE — Telephone Encounter (Signed)
Scheduled

## 2020-05-23 NOTE — Telephone Encounter (Signed)
Can we call her to schedule asap.

## 2020-05-26 ENCOUNTER — Other Ambulatory Visit: Payer: Self-pay

## 2020-05-26 ENCOUNTER — Encounter: Payer: Self-pay | Admitting: Physician Assistant

## 2020-05-26 ENCOUNTER — Ambulatory Visit: Payer: 59 | Admitting: Physician Assistant

## 2020-05-26 VITALS — BP 148/83 | HR 90 | Temp 98.3°F | Resp 16 | Wt 299.9 lb

## 2020-05-26 DIAGNOSIS — R21 Rash and other nonspecific skin eruption: Secondary | ICD-10-CM

## 2020-05-26 MED ORDER — PREDNISONE 10 MG (21) PO TBPK: ORAL_TABLET | ORAL | 0 refills | Status: DC

## 2020-05-26 NOTE — Patient Instructions (Signed)
Rash, Adult A rash is a change in the color of your skin. A rash can also change the way your skin feels. There are many different conditions and factors that can cause a rash. Some rashes may disappear after a few days, but some may last for a few weeks. Common causes of rashes include:  Viral infections, such as: ? Colds. ? Measles. ? Hand, foot, and mouth disease.  Bacterial infections, such as: ? Scarlet fever. ? Impetigo.  Fungal infections, such as Candida.  Allergic reactions to food, medicines, or skin care products. Follow these instructions at home: The goal of treatment is to stop the itching and keep the rash from spreading. Pay attention to any changes in your symptoms. Follow these instructions to help with your condition: Medicine Take or apply over-the-counter and prescription medicines only as told by your health care provider. These may include:  Corticosteroid creams to treat red or swollen skin.  Anti-itch lotions.  Oral allergy medicines (antihistamines).  Oral corticosteroids for severe symptoms.  Skin care  Apply cool compresses to the affected areas.  Do not scratch or rub your skin.  Avoid covering the rash. Make sure the rash is exposed to air as much as possible. Managing itching and discomfort  Avoid hot showers or baths, which can make itching worse. A cold shower may help.  Try taking a bath with: ? Epsom salts. Follow manufacturer instructions on the packaging. You can get these at your local pharmacy or grocery store. ? Baking soda. Pour a small amount into the bath as told by your health care provider. ? Colloidal oatmeal. Follow manufacturer instructions on the packaging. You can get this at your local pharmacy or grocery store.  Try applying baking soda paste to your skin. Stir water into baking soda until it reaches a paste-like consistency.  Try applying calamine lotion. This is an over-the-counter lotion that helps to relieve  itchiness.  Keep cool and out of the sun. Sweating and being hot can make itching worse. General instructions   Rest as needed.  Drink enough fluid to keep your urine pale yellow.  Wear loose-fitting clothing.  Avoid scented soaps, detergents, and perfumes. Use gentle soaps, detergents, perfumes, and other cosmetic products.  Avoid any substance that causes your rash. Keep a journal to help track what causes your rash. Write down: ? What you eat. ? What cosmetic products you use. ? What you drink. ? What you wear. This includes jewelry.  Keep all follow-up visits as told by your health care provider. This is important. Contact a health care provider if:  You sweat at night.  You lose weight.  You urinate more than normal.  You urinate less than normal, or you notice that your urine is a darker color than usual.  You feel weak.  You vomit.  Your skin or the whites of your eyes look yellow (jaundice).  Your skin: ? Tingles. ? Is numb.  Your rash: ? Does not go away after several days. ? Gets worse.  You are: ? Unusually thirsty. ? More tired than normal.  You have: ? New symptoms. ? Pain in your abdomen. ? A fever. ? Diarrhea. Get help right away if you:  Have a fever and your symptoms suddenly get worse.  Develop confusion.  Have a severe headache or a stiff neck.  Have severe joint pains or stiffness.  Have a seizure.  Develop a rash that covers all or most of your body. The rash may   or may not be painful.  Develop blisters that: ? Are on top of the rash. ? Grow larger or grow together. ? Are painful. ? Are inside your nose or mouth.  Develop a rash that: ? Looks like purple pinprick-sized spots all over your body. ? Has a "bull's eye" or looks like a target. ? Is not related to sun exposure, is red and painful, and causes your skin to peel. Summary  A rash is a change in the color of your skin. Some rashes disappear after a few days,  but some may last for a few weeks.  The goal of treatment is to stop the itching and keep the rash from spreading.  Take or apply over-the-counter and prescription medicines only as told by your health care provider.  Contact a health care provider if you have new or worsening symptoms.  Keep all follow-up visits as told by your health care provider. This is important. This information is not intended to replace advice given to you by your health care provider. Make sure you discuss any questions you have with your health care provider. Document Revised: 09/15/2018 Document Reviewed: 12/26/2017 Elsevier Patient Education  2020 Elsevier Inc.  

## 2020-05-26 NOTE — Progress Notes (Signed)
Established patient visit   Patient: Mandy Baldwin   DOB: Aug 26, 1960   59 y.o. Female  MRN: [id number] Visit Date: 05/26/2020  Today's healthcare provider: Margaretann Loveless, PA-C   Chief Complaint  Patient presents with  . Rash   Subjective    Rash This is a new problem. The problem has been gradually improving since onset. The rash is diffuse. The rash is characterized by redness and itchiness. She was exposed to nothing. Associated symptoms include congestion, coughing and rhinorrhea. Past treatments include nothing.     Patient has had URI symptoms since 05/21/20 and is on augmentin. Rash started prior to antibiotic starting. Has had covid 19 testing and was negative.   Patient Active Problem List   Diagnosis Date Noted  . Personal history of colonic polyps   . Bilateral carotid artery stenosis 08/06/2019  . Precordial pain 08/06/2019  . Fibromyalgia 01/03/2018  . Abnormal ECG 08/10/2017  . Benign essential HTN 08/10/2017  . Palpitations 08/10/2017  . History of shingles 04/14/2017  . Family history of abdominal aortic aneurysm (AAA) 04/14/2017  . Hypercholesterolemia 04/14/2017  . Heartburn   . Stricture and stenosis of esophagus   . Gastritis   . Mass of right breast on mammogram 07/16/2015  . GERD (gastroesophageal reflux disease) 07/14/2015  . Internal hemorrhoid, bleeding 06/27/2015  . 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 BMI 50.0-59.9, adult (HCC) 02/07/2015  . Gravida 0 02/07/2015  . Abnormal glucose 02/07/2015  . Morbid obesity (HCC) 12/14/2014  . Irregular menses 12/14/2014  . Vitamin D deficiency 12/14/2014  . Ocular migraine 12/14/2014  . Osteoarthritis of right knee 12/14/2014   Past Medical History:  Diagnosis Date  . GERD (gastroesophageal reflux disease)   . Hyperlipidemia   . Hypertension   . Knee pain    right knee, bone on bone  . [place]    [place]  . Osteoarthritis of both knees   . Shingles  12/14/2014  . TMJ (dislocation of temporomandibular joint)        Medications: Outpatient Medications Prior to Visit  Medication Sig  . amoxicillin-clavulanate (AUGMENTIN) 875-125 MG tablet Take 1 tablet by mouth 2 (two) times daily.  Marland Kitchen aspirin 81 MG tablet Take 81 mg by mouth daily.  Marland Kitchen atorvastatin (LIPITOR) 20 MG tablet Take by mouth daily. pm  . cholecalciferol (VITAMIN D) 1000 UNITS tablet Take 2,000 Units by mouth daily.  . cyclobenzaprine (FLEXERIL) 5 MG tablet Take 1 tablet (5 mg total) by mouth 3 (three) times daily as needed for muscle spasms.  Marland Kitchen etodolac (LODINE) 400 MG tablet Take 1 tablet (400 mg total) by mouth 2 (two) times daily as needed.  . fluticasone (FLONASE) 50 MCG/ACT nasal spray Use 2 spray(s) in each nostril once daily  . magnesium oxide (MAG-OX) 400 MG tablet Take 400 mg by mouth daily.  . metoprolol succinate (TOPROL-XL) 25 MG 24 hr tablet Take 2 tablets (50 mg total) by mouth daily. (Patient taking differently: Take 50 mg by mouth daily. pm)  . Multiple Vitamins-Minerals (MULTIVITAL PO) Take 1 tablet by mouth daily.  . pantoprazole (PROTONIX) 40 MG tablet Take 1 tablet by mouth twice daily  . promethazine (PHENERGAN) 12.5 MG tablet TAKE 1 TABLET BY MOUTH EVERY 8 HOURS AS NEEDED FOR NAUSEA AND VOMITING  . triamcinolone cream (KENALOG) 0.1 % APPLY TOPICALLY TWICE DAILY (Patient not taking: No sig reported)  . triamterene-hydrochlorothiazide (MAXZIDE-25) 37.5-25 MG tablet Take 1 tablet by mouth daily. (Patient  taking differently: Take 1 tablet by mouth daily. am)  . valACYclovir (VALTREX) 1000 MG tablet Take 1 tablet (1,000 mg total) by mouth 3 (three) times daily.   No facility-administered medications prior to visit.    Review of Systems  Constitutional: Negative.   HENT: Positive for congestion, rhinorrhea and sinus pain.   Respiratory: Positive for cough.   Cardiovascular: Negative.   Skin: Positive for rash.    Last CBC Lab Results  Component Value Date    WBC 10.4 10/12/2019   HGB 15.3 10/12/2019   HCT 44.5 10/12/2019   MCV 91 10/12/2019   MCH 31.3 10/12/2019   RDW 12.6 10/12/2019   PLT 253 10/12/2019   Last metabolic panel Lab Results  Component Value Date   GLUCOSE 93 10/12/2019   NA 139 10/12/2019   K 3.9 10/12/2019   CL 101 10/12/2019   CO2 25 10/12/2019   BUN 22 10/12/2019   CREATININE 0.54 (L) 10/12/2019   GFRNONAA 106 10/12/2019   GFRAA 122 10/12/2019   CALCIUM 9.6 10/12/2019   PROT 7.1 10/12/2019   ALBUMIN 4.1 10/12/2019   LABGLOB 3.0 10/12/2019   AGRATIO 1.4 10/12/2019   BILITOT 0.3 10/12/2019   ALKPHOS 102 10/12/2019   AST 18 10/12/2019   ALT 17 10/12/2019   ANIONGAP 13 08/02/2019      Objective    BP (!) 148/83 (BP Location: Right Arm, Patient Position: Sitting, Cuff Size: Large)   Pulse 90   Temp 98.3 F (36.8 C) (Oral)   Resp 16   Wt 299 lb 14.4 oz (136 kg)   SpO2 96%   BMI 53.12 kg/m  BP Readings from Last 3 Encounters:  05/26/20 (!) 148/83  02/15/20 139/87  10/12/19 (!) 146/80   Wt Readings from Last 3 Encounters:  05/26/20 299 lb 14.4 oz (136 kg)  02/15/20 285 lb (129.3 kg)  10/12/19 294 lb (133.4 kg)      Physical Exam Vitals reviewed.  Constitutional:      General: She is not in acute distress.    Appearance: Normal appearance. She is well-developed and well-nourished. She is obese. She is not ill-appearing.  HENT:     Head: Normocephalic and atraumatic.  Eyes:     Extraocular Movements: EOM normal.  Pulmonary:     Effort: Pulmonary effort is normal. No respiratory distress.  Musculoskeletal:     Cervical back: Normal range of motion and neck supple.  Skin:    General: Skin is warm and dry.     Findings: Rash present. Rash is papular (diffuse, red and violet, pruritic papular rash on legs, dorsal aspect of feet and arms).  Neurological:     Mental Status: She is alert.  Psychiatric:        Mood and Affect: Mood and affect normal.        Behavior: Behavior normal.         Thought Content: Thought content normal.        Judgment: Judgment normal.      No results found for any visits on 05/26/20.  Assessment & Plan     1. Rash Suspect possible viral exanthem rash from current URI. Will treat with prednisone as below since rash is itching. It has stopped spreading and is getting lighter in discoloration. Suspect prednisone will clear completely. Call if changes or worsening.  - predniSONE (STERAPRED UNI-PAK 21 TAB) 10 MG (21) TBPK tablet; 6 day taper; take as directed on package instructions  Dispense: 21 tablet; Refill:  0   No follow-ups on file.      Delmer Islam, PA-C, have reviewed all documentation for this visit. The documentation on 05/26/20 for the exam, diagnosis, procedures, and orders are all accurate and complete.   Reine Just  [facility] [phone] (phone) [phone] (fax)  [facility]

## 2020-05-29 ENCOUNTER — Other Ambulatory Visit: Payer: Self-pay | Admitting: Physician Assistant

## 2020-05-29 DIAGNOSIS — K2100 Gastro-esophageal reflux disease with esophagitis, without bleeding: Secondary | ICD-10-CM

## 2020-05-29 DIAGNOSIS — I10 Essential (primary) hypertension: Secondary | ICD-10-CM

## 2020-05-30 ENCOUNTER — Encounter: Payer: Self-pay | Admitting: Physician Assistant

## 2020-05-30 DIAGNOSIS — R21 Rash and other nonspecific skin eruption: Secondary | ICD-10-CM

## 2020-06-10 ENCOUNTER — Telehealth: Payer: Self-pay | Admitting: Physician Assistant

## 2020-06-10 MED ORDER — PREDNISONE 10 MG (21) PO TBPK: ORAL_TABLET | ORAL | 0 refills | Status: DC

## 2020-06-10 NOTE — Telephone Encounter (Signed)
[facility] faxed refill request for the following medications:  predniSONE (STERAPRED UNI-PAK 21 TAB) 10 MG (21) TBPK tablet  Last Rx: 05/26/2020 LOV: 05/26/2020 Please advise. Thanks TNP

## 2020-06-10 NOTE — Telephone Encounter (Signed)
This was sent in today.  

## 2020-06-10 NOTE — Addendum Note (Signed)
Addended by: Margaretann Loveless on: 06/10/2020 11:31 AM   Modules accepted: Orders

## 2020-06-22 ENCOUNTER — Other Ambulatory Visit: Payer: Self-pay | Admitting: Physician Assistant

## 2020-06-22 DIAGNOSIS — K2100 Gastro-esophageal reflux disease with esophagitis, without bleeding: Secondary | ICD-10-CM

## 2020-06-22 DIAGNOSIS — M26609 Unspecified temporomandibular joint disorder, unspecified side: Secondary | ICD-10-CM

## 2020-07-17 ENCOUNTER — Other Ambulatory Visit: Payer: Self-pay | Admitting: Physician Assistant

## 2020-07-17 DIAGNOSIS — I10 Essential (primary) hypertension: Secondary | ICD-10-CM

## 2020-08-03 ENCOUNTER — Other Ambulatory Visit: Payer: Self-pay | Admitting: Physician Assistant

## 2020-08-03 DIAGNOSIS — K2100 Gastro-esophageal reflux disease with esophagitis, without bleeding: Secondary | ICD-10-CM

## 2020-08-03 NOTE — Telephone Encounter (Signed)
Requested Prescriptions  Pending Prescriptions Disp Refills  . pantoprazole (PROTONIX) 40 MG tablet [Pharmacy Med Name: Pantoprazole Sodium 40 MG Oral Tablet Delayed Release] 60 tablet 0    Sig: Take 1 tablet by mouth twice daily     Gastroenterology: Proton Pump Inhibitors Passed - 08/03/2020  5:37 PM      Passed - Valid encounter within last 12 months    Recent Outpatient Visits          2 months ago Rash   [facility] [place], [place], [place]   2 months ago Acute non-recurrent pansinusitis   [facility] Joycelyn [place], [place]   9 months ago Viral URI   [facility] [place], Marzella Schlein, [place]   9 months ago Annual physical exam   [facility] [place], [place]   12 months ago Palpitations   [facility] [place], [place], [place]

## 2020-08-19 ENCOUNTER — Other Ambulatory Visit: Payer: Self-pay | Admitting: Physician Assistant

## 2020-08-19 DIAGNOSIS — I10 Essential (primary) hypertension: Secondary | ICD-10-CM

## 2020-08-20 ENCOUNTER — Encounter: Payer: Self-pay | Admitting: Physician Assistant

## 2020-09-03 ENCOUNTER — Other Ambulatory Visit: Payer: Self-pay | Admitting: Physician Assistant

## 2020-09-03 DIAGNOSIS — K2100 Gastro-esophageal reflux disease with esophagitis, without bleeding: Secondary | ICD-10-CM

## 2020-09-05 ENCOUNTER — Other Ambulatory Visit: Payer: Self-pay | Admitting: Physician Assistant

## 2020-09-05 DIAGNOSIS — R21 Rash and other nonspecific skin eruption: Secondary | ICD-10-CM

## 2020-09-05 NOTE — Telephone Encounter (Signed)
   Notes to clinic:  This script has expired  Review for continued use and refill    Requested Prescriptions  Pending Prescriptions Disp Refills   triamcinolone (KENALOG) 0.1 % [Pharmacy Med Name: Triamcinolone Acetonide 0.1 % External Cream] 30 g 0    Sig: APPLY  CREAM EXTERNALLY TWICE DAILY      Dermatology:  Corticosteroids Passed - 09/05/2020  7:15 AM      Passed - Valid encounter within last 12 months    Recent Outpatient Visits           3 months ago Rash   [facility] [place], [place], [place]   3 months ago Acute non-recurrent pansinusitis   [facility] [place], [place]   10 months ago Viral URI   [facility] [place], Marzella Schlein, [place]   10 months ago Annual physical exam   [facility], Alessandra Bevels, [place]   1 year ago Palpitations   [facility] [place], Alessandra Bevels, [place]       Future Appointments             In 3 days [place], Alessandra Bevels, PA-C [facility], [facility]

## 2020-09-08 ENCOUNTER — Encounter: Payer: Self-pay | Admitting: Physician Assistant

## 2020-09-08 ENCOUNTER — Other Ambulatory Visit: Payer: Self-pay

## 2020-09-08 ENCOUNTER — Ambulatory Visit: Payer: 59 | Admitting: Physician Assistant

## 2020-09-08 VITALS — BP 129/85 | HR 84 | Temp 98.4°F | Wt 303.0 lb

## 2020-09-08 DIAGNOSIS — H66002 Acute suppurative otitis media without spontaneous rupture of ear drum, left ear: Secondary | ICD-10-CM | POA: Diagnosis not present

## 2020-09-08 DIAGNOSIS — J301 Allergic rhinitis due to pollen: Secondary | ICD-10-CM | POA: Diagnosis not present

## 2020-09-08 DIAGNOSIS — L918 Other hypertrophic disorders of the skin: Secondary | ICD-10-CM

## 2020-09-08 MED ORDER — CIPROFLOXACIN-DEXAMETHASONE 0.3-0.1 % OT SUSP: 4.0000 [drp] | Freq: Two times a day (BID) | OTIC | 0 refills | Status: DC

## 2020-09-08 MED ORDER — FLUTICASONE PROPIONATE 50 MCG/ACT NA SUSP: NASAL | 1 refills | Status: AC

## 2020-09-08 NOTE — Progress Notes (Signed)
Established patient visit   Patient: Mandy Baldwin   DOB: 1961-01-26   60 y.o. Female  MRN: [id number] Visit Date: 09/08/2020  Today's healthcare provider: Margaretann Loveless, PA-C   Chief Complaint  Patient presents with  . Ear Fullness   Subjective    Ear Fullness  There is pain in the left ear. This is a new problem. Episode onset: Started about a month ago. The problem has been unchanged. There has been no fever. Associated symptoms include coughing. Pertinent negatives include no ear discharge, headaches, hearing loss, neck pain, rhinorrhea or sore throat.    Pt would also like to have some skin tags removed if possible.    Patient Active Problem List   Diagnosis Date Noted  . Personal history of colonic polyps   . Bilateral carotid artery stenosis 08/06/2019  . Precordial pain 08/06/2019  . Fibromyalgia 01/03/2018  . Abnormal ECG 08/10/2017  . Benign essential HTN 08/10/2017  . Palpitations 08/10/2017  . History of shingles 04/14/2017  . Family history of abdominal aortic aneurysm (AAA) 04/14/2017  . Hypercholesterolemia 04/14/2017  . Heartburn   . Stricture and stenosis of esophagus   . Gastritis   . Mass of right breast on mammogram 07/16/2015  . GERD (gastroesophageal reflux disease) 07/14/2015  . Internal hemorrhoid, bleeding 06/27/2015  . 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 BMI 50.0-59.9, adult (HCC) 02/07/2015  . Gravida 0 02/07/2015  . Abnormal glucose 02/07/2015  . Morbid obesity (HCC) 12/14/2014  . Irregular menses 12/14/2014  . Vitamin D deficiency 12/14/2014  . Ocular migraine 12/14/2014  . Osteoarthritis of right knee 12/14/2014   Past Medical History:  Diagnosis Date  . GERD (gastroesophageal reflux disease)   . Hyperlipidemia   . Hypertension   . Knee pain    right knee, bone on bone  . Motion sickness    [place]  . Osteoarthritis of both knees   . Shingles 12/14/2014  . TMJ (dislocation of  temporomandibular joint)    Social History   Tobacco Use  . Smoking status: Never Smoker  . Smokeless tobacco: Never Used  Vaping Use  . Vaping Use: Never used  Substance Use Topics  . Alcohol use: No  . Drug use: No   Allergies  Allergen Reactions  . Latex Hives and Rash  . Tape Rash    Some bandaids cause rash     Medications: Outpatient Medications Prior to Visit  Medication Sig  . aspirin 81 MG tablet Take 81 mg by mouth daily.  . cholecalciferol (VITAMIN D) 1000 UNITS tablet Take 2,000 Units by mouth daily.  . cyclobenzaprine (FLEXERIL) 5 MG tablet Take 1 tablet (5 mg total) by mouth 3 (three) times daily as needed for muscle spasms.  Marland Kitchen etodolac (LODINE) 400 MG tablet Take 1 tablet by mouth twice daily as needed  . magnesium oxide (MAG-OX) 400 MG tablet Take 400 mg by mouth daily.  . metoprolol succinate (TOPROL-XL) 25 MG 24 hr tablet Take 2 tablets by mouth once daily  . Multiple Vitamins-Minerals (MULTIVITAL PO) Take 1 tablet by mouth daily.  . pantoprazole (PROTONIX) 40 MG tablet Take 1 tablet by mouth twice daily  . promethazine (PHENERGAN) 12.5 MG tablet TAKE 1 TABLET BY MOUTH EVERY 8 HOURS AS NEEDED FOR NAUSEA AND VOMITING  . triamcinolone (KENALOG) 0.1 % APPLY  CREAM EXTERNALLY TWICE DAILY  . triamterene-hydrochlorothiazide (MAXZIDE-25) 37.5-25 MG tablet TAKE 1 TABLET BY MOUTH ONCE DAILY (MUST  HAVE  OFFICE  VISIT  PRIOR  TO  FURTHER  REFILLS)  . [DISCONTINUED] fluticasone (FLONASE) 50 MCG/ACT nasal spray Use 2 spray(s) in each nostril once daily  . atorvastatin (LIPITOR) 20 MG tablet Take by mouth daily. pm  . predniSONE (STERAPRED UNI-PAK 21 TAB) 10 MG (21) TBPK tablet 6 day taper; take as directed on package instructions  . valACYclovir (VALTREX) 1000 MG tablet Take 1 tablet (1,000 mg total) by mouth 3 (three) times daily.  . [DISCONTINUED] amoxicillin-clavulanate (AUGMENTIN) 875-125 MG tablet Take 1 tablet by mouth 2 (two) times daily.   No  facility-administered medications prior to visit.    Review of Systems  Constitutional: Negative.   HENT: Positive for congestion and sinus pressure. Negative for ear discharge, ear pain, hearing loss, postnasal drip, rhinorrhea, sinus pain, sneezing, sore throat, tinnitus, trouble swallowing and voice change.   Eyes: Positive for discharge and itching. Negative for photophobia, pain, redness and visual disturbance.  Respiratory: Positive for cough. Negative for apnea, choking, chest tightness, shortness of breath, wheezing and stridor.   Cardiovascular: Negative.   Gastrointestinal: Negative.   Musculoskeletal: Negative for neck pain.  Neurological: Negative for dizziness, light-headedness and headaches.        Objective    BP 129/85 (BP Location: Left Arm, Patient Position: Sitting, Cuff Size: Large)   Pulse 84   Temp 98.4 F (36.9 C) (Oral)   Wt (!) 303 lb (137.4 kg)   BMI 53.67 kg/m    Physical Exam Vitals reviewed.  Constitutional:      General: She is not in acute distress.    Appearance: Normal appearance. She is well-developed. She is obese. She is not ill-appearing.  HENT:     Head: Normocephalic and atraumatic.     Right Ear: Hearing, tympanic membrane, ear canal and external ear normal.     Left Ear: Hearing, ear canal and external ear normal. Swelling and tenderness present. A middle ear effusion (opaque) is present. Tympanic membrane is erythematous.  Eyes:     General:        Right eye: No discharge.        Left eye: No discharge.     Extraocular Movements: Extraocular movements intact.     Conjunctiva/sclera: Conjunctivae normal.     Pupils: Pupils are equal, round, and reactive to light.  Neck:     Thyroid: No thyromegaly.     Vascular: No JVD.     Trachea: No tracheal deviation.     Meningeal: Brudzinski's sign and Kernig's sign absent.  Cardiovascular:     Rate and Rhythm: Normal rate and regular rhythm.     Heart sounds: Normal heart sounds. No  murmur heard. No friction rub. No gallop.   Pulmonary:     Effort: Pulmonary effort is normal. No respiratory distress.     Breath sounds: Normal breath sounds. No stridor. No wheezing or rales.  Chest:     Chest wall: No tenderness.  Musculoskeletal:     Cervical back: Normal range of motion and neck supple.  Lymphadenopathy:     Cervical: No cervical adenopathy.  Skin:    General: Skin is warm and dry.       Neurological:     Mental Status: She is alert.      No results found for any visits on 09/08/20.  Assessment & Plan     1. Non-recurrent acute suppurative otitis media of left ear without spontaneous rupture of tympanic membrane Ear infection noted. Ciprodex sent in. Call if not  improving. - ciprofloxacin-dexamethasone (CIPRODEX) OTIC suspension; Place 4 drops into the left ear 2 (two) times daily. X 7 days  Dispense: 7.5 mL; Refill: 0  2. Seasonal allergic rhinitis due to pollen Stable. Diagnosis pulled for medication refill. Continue current medical treatment plan. - fluticasone (FLONASE) 50 MCG/ACT nasal spray; Use 2 spray(s) in each nostril once daily  Dispense: 48 g; Refill: 1  3. Skin tag Skin tag removed without issue. See procedure note.   Procedure Note: Procedure, benefits, risk (including those of bleeding, infection, injury, allergic reaction) and alternatives were explained to the patient who voiced understanding of the information. Questions were sought and answered. The patient agreed to proceed with the skin tag removal. Verbal consent was obtained.  The area was prepped with Betadine swab and draped in a sterile fashion. Local anesthetic was administered hurricane spray. The skin tag was then removed using a forceps and scissors. There was minimal bleeding. Hemostasis was intact. The wound was then cleaned and a dry dressing placed. There were no complications.   No follow-ups on file.      Delmer Islam, PA-C, have reviewed all  documentation for this visit. The documentation on 09/08/20 for the exam, diagnosis, procedures, and orders are all accurate and complete.   Reine Just  [facility] [phone] (phone) [phone] (fax)  [facility]

## 2020-09-09 ENCOUNTER — Telehealth: Payer: Self-pay | Admitting: Physician Assistant

## 2020-09-09 DIAGNOSIS — H66002 Acute suppurative otitis media without spontaneous rupture of ear drum, left ear: Secondary | ICD-10-CM

## 2020-09-09 MED ORDER — NEOMYCIN-POLYMYXIN-HC 3.5-10000-1 OT SOLN: 3.0000 [drp] | Freq: Three times a day (TID) | OTIC | 0 refills | Status: DC

## 2020-09-09 MED ORDER — CORTISPORIN-TC 3.3-3-10-0.5 MG/ML OT SUSP: 4.0000 [drp] | Freq: Three times a day (TID) | OTIC | 0 refills | Status: DC

## 2020-09-09 NOTE — Telephone Encounter (Signed)
Patient is calling again because the medication that was sent to the pharmacy is too expensive for out of pocket costs.  Patient would like to know if there is something else less expensive that she can get.  Pease advise and call to discuss at [phone]

## 2020-09-09 NOTE — Telephone Encounter (Signed)
L/M advising pt as below.

## 2020-09-09 NOTE — Telephone Encounter (Signed)
changed

## 2020-09-09 NOTE — Telephone Encounter (Signed)
Pharmacy called and stated that the CORTISPORIN-TC) Sent over is covered by Pts insurance but the Copay is over $100 / Pt wants to know if regular CORTISPORIN without the TC can be sent for lower cost/ please advise

## 2020-09-27 ENCOUNTER — Other Ambulatory Visit: Payer: Self-pay | Admitting: Physician Assistant

## 2020-09-27 DIAGNOSIS — M26609 Unspecified temporomandibular joint disorder, unspecified side: Secondary | ICD-10-CM

## 2020-09-29 ENCOUNTER — Telehealth: Payer: Self-pay | Admitting: Physician Assistant

## 2020-09-29 ENCOUNTER — Other Ambulatory Visit: Payer: Self-pay

## 2020-09-29 ENCOUNTER — Encounter: Payer: Self-pay | Admitting: Physician Assistant

## 2020-09-29 ENCOUNTER — Ambulatory Visit: Payer: 59 | Admitting: Physician Assistant

## 2020-09-29 DIAGNOSIS — J0100 Acute maxillary sinusitis, unspecified: Secondary | ICD-10-CM | POA: Diagnosis not present

## 2020-09-29 DIAGNOSIS — Z7689 Persons encountering health services in other specified circumstances: Secondary | ICD-10-CM

## 2020-09-29 DIAGNOSIS — M1711 Unilateral primary osteoarthritis, right knee: Secondary | ICD-10-CM

## 2020-09-29 DIAGNOSIS — R110 Nausea: Secondary | ICD-10-CM

## 2020-09-29 DIAGNOSIS — H6690 Otitis media, unspecified, unspecified ear: Secondary | ICD-10-CM

## 2020-09-29 DIAGNOSIS — I10 Essential (primary) hypertension: Secondary | ICD-10-CM | POA: Diagnosis not present

## 2020-09-29 DIAGNOSIS — K2100 Gastro-esophageal reflux disease with esophagitis, without bleeding: Secondary | ICD-10-CM

## 2020-09-29 DIAGNOSIS — Z6841 Body Mass Index (BMI) 40.0 and over, adult: Secondary | ICD-10-CM

## 2020-09-29 DIAGNOSIS — R5383 Other fatigue: Secondary | ICD-10-CM

## 2020-09-29 DIAGNOSIS — K219 Gastro-esophageal reflux disease without esophagitis: Secondary | ICD-10-CM

## 2020-09-29 MED ORDER — AMOXICILLIN-POT CLAVULANATE 875-125 MG PO TABS: 1.0000 | ORAL_TABLET | Freq: Two times a day (BID) | ORAL | 0 refills | Status: DC

## 2020-09-29 NOTE — Progress Notes (Signed)
[facility] [address] [place], [place] [address]  Internal MEDICINE  Office Visit Note  Patient Name: Mandy Baldwin  [id number]  [id number]  Date of Service: 09/30/2020   Complaints/HPI Pt is here for establishment of PCP. Chief Complaint  Patient presents with  . New Patient (Initial Visit)    Left Ear infection, meds did not help, feels full, hearing is muffled, was red 2 weeks ago, started 2 months ago, coughing up green mucus, nose is stuffy, worse in the morning, last weekend 09-19-20 and 09-20-20 pt had vomiting and diarrhea and neg covid test, discuss weightloss  . Gastroesophageal Reflux  . Hyperlipidemia  . Hypertension   HPI Pt is here to establish care since her PA at former practice left and friends referred her here. -Pt has been dealing with left ear pain and pressure for almost 2 months. Previous provider started her on neo/polymyxin drops TID but this did not help anything. Did this for 10 days, no longer using. -Sinus congestion and drainage for a few weeks. She has taken tylenol to help with sinus pressure/ear pain. -Takes an anithistamine OTC daily along with flonase nasal spray at night.  -Maxzide and metoprolol for her BP. Repeat in office 144/90. At home normally well controlled around 128/80.  -Takes etodolac for arthritis in knees -trying to lose weight and doing low carb diet without much success. If she does too much walking bothers her knees -She does snore sometimes, no gasping, snorting, or witnessed apneas and does not feel overly sleepy during the daytime currently. -Does mention she had GI upset 1.5 weeks ago that lasted about 3 days with diarrhea, nausea and vomiting but this resolved. -married and works at [employer]  Current Medication: Outpatient Encounter Medications as of 09/29/2020  Medication Sig  . amoxicillin-clavulanate (AUGMENTIN) 875-125 MG tablet Take 1 tablet by mouth 2 (two) times daily.  Marland Kitchen aspirin 81 MG  tablet Take 81 mg by mouth daily.  . cholecalciferol (VITAMIN D) 1000 UNITS tablet Take 2,000 Units by mouth daily.  . cyclobenzaprine (FLEXERIL) 5 MG tablet Take 1 tablet (5 mg total) by mouth 3 (three) times daily as needed for muscle spasms.  Marland Kitchen etodolac (LODINE) 400 MG tablet Take 1 tablet by mouth twice daily as needed  . fluticasone (FLONASE) 50 MCG/ACT nasal spray Use 2 spray(s) in each nostril once daily  . magnesium oxide (MAG-OX) 400 MG tablet Take 400 mg by mouth daily.  . metoprolol succinate (TOPROL-XL) 25 MG 24 hr tablet Take 2 tablets by mouth once daily  . Multiple Vitamins-Minerals (MULTIVITAL PO) Take 1 tablet by mouth daily.  . pantoprazole (PROTONIX) 40 MG tablet Take 1 tablet by mouth twice daily  . promethazine (PHENERGAN) 12.5 MG tablet TAKE 1 TABLET BY MOUTH EVERY 8 HOURS AS NEEDED FOR NAUSEA AND VOMITING  . triamcinolone (KENALOG) 0.1 % APPLY  CREAM EXTERNALLY TWICE DAILY  . triamterene-hydrochlorothiazide (MAXZIDE-25) 37.5-25 MG tablet TAKE 1 TABLET BY MOUTH ONCE DAILY (MUST  HAVE  OFFICE  VISIT  PRIOR  TO  FURTHER  REFILLS)  . atorvastatin (LIPITOR) 20 MG tablet Take by mouth daily. pm  . neomycin-polymyxin-hydrocortisone (CORTISPORIN) OTIC solution Place 3 drops into the left ear 3 (three) times daily. (Patient not taking: Reported on 09/29/2020)   No facility-administered encounter medications on file as of 09/29/2020.    Surgical History: Past Surgical History:  Procedure Laterality Date  . COLONOSCOPY    . COLONOSCOPY WITH PROPOFOL N/A 02/15/2020   Procedure: COLONOSCOPY  WITH PROPOFOL;  Surgeon: [name], MD;  Location: [facility];  Service: Endoscopy;  Laterality: N/A;  priority 4  . ESOPHAGOGASTRODUODENOSCOPY (EGD) WITH PROPOFOL N/A 09/18/2015   Procedure: ESOPHAGOGASTRODUODENOSCOPY (EGD) WITH PROPOFOL with ballon dilatation;  Surgeon: [name], MD;  Location: [facility];  Service: Endoscopy;  Laterality: N/A;  . LAPAROSCOPIC OVARIAN       Medical History: Past Medical History:  Diagnosis Date  . GERD (gastroesophageal reflux disease)   . Hyperlipidemia   . Hypertension   . Knee pain    right knee, bone on bone  . Motion sickness    [place]  . Osteoarthritis of both knees   . Shingles 12/14/2014  . TMJ (dislocation of temporomandibular joint)     Family History: Family History  Problem Relation Age of Onset  . Dementia Mother   . Aortic aneurysm Mother   . COPD Father   . Breast cancer Sister 46  . Diabetes Sister   . COPD Brother   . Cancer Brother   . Stroke Sister     Social History   Socioeconomic History  . Marital status: Married    Spouse name: Maizey Menendez  . Number of children: 0  . Years of education: 59  . Highest education level: Not on file  Occupational History  . Occupation: Clinical biochemist    Comment: Production manager: [employer]  Tobacco Use  . Smoking status: Never Smoker  . Smokeless tobacco: Never Used  Vaping Use  . Vaping Use: Never used  Substance and Sexual Activity  . Alcohol use: No  . Drug use: No  . Sexual activity: Not on file  Other Topics Concern  . Not on file  Social History Narrative  . Not on file   Social Determinants of Health   Financial Resource Strain: Not on file  Food Insecurity: Not on file  Transportation Needs: Not on file  Physical Activity: Not on file  Stress: Not on file  Social Connections: Not on file  Intimate Partner Violence: Not on file     Review of Systems  Constitutional: Negative for chills, fatigue and unexpected weight change.  HENT: Positive for congestion, ear pain, postnasal drip, sinus pressure and sinus pain. Negative for rhinorrhea, sneezing and sore throat.   Eyes: Negative for redness.  Respiratory: Negative for cough, chest tightness and shortness of breath.   Cardiovascular: Negative for chest pain and palpitations.  Gastrointestinal: Negative for abdominal pain, constipation,  diarrhea, nausea and vomiting.  Genitourinary: Negative for dysuria and frequency.  Musculoskeletal: Positive for arthralgias. Negative for back pain, joint swelling and neck pain.  Skin: Negative for rash.  Neurological: Negative.  Negative for tremors and numbness.  Hematological: Negative for adenopathy. Does not bruise/bleed easily.  Psychiatric/Behavioral: Negative for behavioral problems (Depression), sleep disturbance and suicidal ideas. The patient is nervous/anxious.     Vital Signs: BP (!) 150/100   Pulse 90   Temp (!) 97.3 F (36.3 C)   Resp 16   Ht 5\' 3"  (1.6 m)   Wt 299 lb (135.6 kg)   SpO2 97%   BMI 52.97 kg/m    Physical Exam Vitals and nursing note reviewed.  Constitutional:      General: She is not in acute distress.    Appearance: She is well-developed. She is obese. She is not diaphoretic.  HENT:     Head: Normocephalic and atraumatic.     Right Ear: Tympanic membrane normal.  Ears:     Comments: L ear canal erythematous with possible fluid behind TM    Mouth/Throat:     Pharynx: No oropharyngeal exudate.  Eyes:     Pupils: Pupils are equal, round, and reactive to light.  Neck:     Thyroid: No thyromegaly.     Vascular: No JVD.     Trachea: No tracheal deviation.  Cardiovascular:     Rate and Rhythm: Normal rate and regular rhythm.     Heart sounds: Normal heart sounds. No murmur heard. No friction rub. No gallop.   Pulmonary:     Effort: Pulmonary effort is normal. No respiratory distress.     Breath sounds: No wheezing or rales.  Chest:     Chest wall: No tenderness.  Abdominal:     General: Bowel sounds are normal.     Palpations: Abdomen is soft.  Musculoskeletal:        General: Normal range of motion.     Cervical back: Normal range of motion and neck supple.  Lymphadenopathy:     Cervical: No cervical adenopathy.  Skin:    General: Skin is warm and dry.  Neurological:     Mental Status: She is alert and oriented to person,  place, and time.     Cranial Nerves: No cranial nerve deficit.  Psychiatric:        Behavior: Behavior normal.        Thought Content: Thought content normal.        Judgment: Judgment normal.       Assessment/Plan: 1. Encounter to establish care with new doctor Will order routine labwork to be done prior to physical next visit  2. Acute otitis media, unspecified otitis media type Failed topical ear drops, will try oral antibiotic to help with ear and sinus infection. If not improving may need ENT referral.  - amoxicillin-clavulanate (AUGMENTIN) 875-125 MG tablet; Take 1 tablet by mouth 2 (two) times daily.  Dispense: 20 tablet; Refill: 0  3. Acute non-recurrent maxillary sinusitis Will start on augmentin--educated to take with food. Also take flonase and mucinex to help with congestion and continue tylenol for headache as needed. - amoxicillin-clavulanate (AUGMENTIN) 875-125 MG tablet; Take 1 tablet by mouth 2 (two) times daily.  Dispense: 20 tablet; Refill: 0  4. Benign essential HTN Elevated in office but improved on recheck. Pt will monitor at home and log BP. Conitnue current medication  5. Osteoarthritis of right knee, unspecified osteoarthritis type Followed by ortho, receiving joint injections  6. Gastroesophageal reflux disease without esophagitis Continue protonix  7. Morbid obesity with BMI of 50.0-59.9, adult (HCC) Obesity Counseling: Had a lengthy discussion regarding patients BMI and weight issues. Patient was instructed on portion control as well as increased activity. Also discussed caloric restrictions with trying to maintain intake less than 2000 Kcal. Discussions were made in accordance with the 5As of weight management. Simple actions such as not eating late and if able to, taking a walk is suggested.  8. Other fatigue - CBC w/Diff/Platelet - Comprehensive metabolic panel - Lipid Panel With LDL/HDL Ratio - TSH + free T4 - VITAMIN D 25 Hydroxy (Vit-D  Deficiency, Fractures)    General Counseling: Aloma verbalizes understanding of the findings of todays visit and agrees with plan of treatment. I have discussed any further diagnostic evaluation that may be needed or ordered today. We also reviewed her medications today. she has been encouraged to call the office with any questions or concerns that should arise  related to todays visit.    Counseling: Hypertension Counseling:   The following hypertensive lifestyle modification were recommended and discussed:  1. Limiting alcohol intake to less than 1 oz/day of ethanol:(24 oz of beer or 8 oz of wine or 2 oz of 100-proof whiskey). 2. Take baby ASA 81 mg daily. 3. Importance of regular aerobic exercise and losing weight. 4. Reduce dietary saturated fat and cholesterol intake for overall cardiovascular health. 5. Maintaining adequate dietary potassium, calcium, and magnesium intake. 6. Regular monitoring of the blood pressure. 7. Reduce sodium intake to less than 100 mmol/day (less than 2.3 gm of sodium or less than 6 gm of sodium choride)    Orders Placed This Encounter  Procedures  . CBC w/Diff/Platelet  . Comprehensive metabolic panel  . Lipid Panel With LDL/HDL Ratio  . TSH + free T4  . VITAMIN D 25 Hydroxy (Vit-D Deficiency, Fractures)    Meds ordered this encounter  Medications  . amoxicillin-clavulanate (AUGMENTIN) 875-125 MG tablet    Sig: Take 1 tablet by mouth 2 (two) times daily.    Dispense:  20 tablet    Refill:  0     This patient was seen by Lynn Ito, PA-C in collaboration with Dr. Beverely Risen as a part of collaborative care agreement.   Time spent:40 Minutes

## 2020-09-30 NOTE — Telephone Encounter (Signed)
Requested medication (s) are due for refill today: no  Requested medication (s) are on the active medication list: yes  Last refill:  05/26/2020  Future visit scheduled: no  Notes to clinic:  looks like patient has a new pcp listed  Review for refill    Requested Prescriptions  Pending Prescriptions Disp Refills   pantoprazole (PROTONIX) 40 MG tablet [Pharmacy Med Name: Pantoprazole Sodium 40 MG Oral Tablet Delayed Release] 60 tablet 0    Sig: Take 1 tablet by mouth twice daily      Gastroenterology: Proton Pump Inhibitors Passed - 09/29/2020  7:14 PM      Passed - Valid encounter within last 12 months    Recent Outpatient Visits           3 weeks ago Non-recurrent acute suppurative otitis media of left ear without spontaneous rupture of tympanic membrane   [facility] [place], Alessandra Bevel[place], [place]   4 months ago Rash   [facility] [place], [place], [place]   4 months ago Acute non-recurrent pansinusitis   [facility] Joycelyn Man [place], [place]   11 months ago Viral URI   [facility] [place], Marzella Schlein, [place]   11 months ago Annual physical exam   [facility] Joycelyn Man M, PA-C                  promethazine (PHENERGAN) 12.5 MG tablet [Pharmacy Med Name: Promethazine HCl 12.5 MG Oral Tablet] 20 tablet 0    Sig: TAKE 1 TABLET BY MOUTH EVERY 8 HOURS AS NEEDED FOR NAUSEA AND VOMITING      Not Delegated - Gastroenterology: Antiemetics Failed - 09/29/2020  7:14 PM      Failed - This refill cannot be delegated      Passed - Valid encounter within last 6 months    Recent Outpatient Visits           3 weeks ago Non-recurrent acute suppurative otitis media of left ear without spontaneous rupture of tympanic membrane   [facility] [place], [place], [place]   4 months ago Rash   [facility] [place], [place], [place]   4 months ago Acute non-recurrent pansinusitis    [facility] Joycelyn Man [place], [place]   11 months ago Viral URI   [facility] [place], Marzella Schlein, [place]   11 months ago Annual physical exam   [facility], [place], [place]

## 2020-10-01 ENCOUNTER — Encounter: Payer: Self-pay | Admitting: Family Medicine

## 2020-10-01 DIAGNOSIS — K2100 Gastro-esophageal reflux disease with esophagitis, without bleeding: Secondary | ICD-10-CM

## 2020-10-02 ENCOUNTER — Other Ambulatory Visit: Payer: Self-pay

## 2020-10-02 DIAGNOSIS — K2100 Gastro-esophageal reflux disease with esophagitis, without bleeding: Secondary | ICD-10-CM

## 2020-10-02 DIAGNOSIS — I10 Essential (primary) hypertension: Secondary | ICD-10-CM

## 2020-10-02 MED ORDER — TRIAMTERENE-HCTZ 37.5-25 MG PO TABS: ORAL_TABLET | ORAL | 1 refills | Status: DC

## 2020-10-02 MED ORDER — PANTOPRAZOLE SODIUM 40 MG PO TBEC: 1.0000 | DELAYED_RELEASE_TABLET | Freq: Two times a day (BID) | ORAL | 1 refills | Status: DC

## 2020-10-02 NOTE — Telephone Encounter (Signed)
Pt called for an update on the Rx refill request. 

## 2020-10-02 NOTE — Telephone Encounter (Signed)
See Pt request message.  Pt has established with another provider.  She will have to get her refills with her new provider.  Pt was made aware of this.   Thanks,   -Vernona Rieger

## 2020-10-06 ENCOUNTER — Telehealth: Payer: Self-pay

## 2020-10-07 ENCOUNTER — Other Ambulatory Visit: Payer: Self-pay | Admitting: Physician Assistant

## 2020-10-07 ENCOUNTER — Telehealth: Payer: Self-pay

## 2020-10-07 DIAGNOSIS — H6690 Otitis media, unspecified, unspecified ear: Secondary | ICD-10-CM

## 2020-10-07 NOTE — Telephone Encounter (Signed)
Spoke with patient to inform her that a referral will be placed in for ENT doctor.

## 2020-10-13 ENCOUNTER — Telehealth: Payer: Self-pay

## 2020-10-13 ENCOUNTER — Other Ambulatory Visit: Payer: Self-pay | Admitting: Physician Assistant

## 2020-10-13 DIAGNOSIS — H6690 Otitis media, unspecified, unspecified ear: Secondary | ICD-10-CM

## 2020-10-13 MED ORDER — LEVOFLOXACIN 500 MG PO TABS: 500.0000 mg | ORAL_TABLET | Freq: Every day | ORAL | 0 refills | Status: DC

## 2020-10-13 NOTE — Telephone Encounter (Signed)
Pt notified we send antibiotic

## 2020-10-13 NOTE — Telephone Encounter (Signed)
I sent her Levaquin to the pharmacy

## 2020-10-21 NOTE — Telephone Encounter (Signed)
error 

## 2020-10-24 ENCOUNTER — Other Ambulatory Visit: Payer: Self-pay | Admitting: Physician Assistant

## 2020-10-24 DIAGNOSIS — I10 Essential (primary) hypertension: Secondary | ICD-10-CM

## 2020-10-27 ENCOUNTER — Other Ambulatory Visit: Payer: Self-pay

## 2020-10-27 DIAGNOSIS — I10 Essential (primary) hypertension: Secondary | ICD-10-CM

## 2020-10-27 MED ORDER — METOPROLOL SUCCINATE ER 25 MG PO TB24: 2.0000 | ORAL_TABLET | Freq: Every day | ORAL | 0 refills | Status: DC

## 2020-12-01 ENCOUNTER — Encounter: Payer: 59 | Admitting: Physician Assistant

## 2020-12-19 LAB — COMPREHENSIVE METABOLIC PANEL
ALT: 25 IU/L (ref 0–32)
AST: 19 IU/L (ref 0–40)
Albumin/Globulin Ratio: 1.6 (ref 1.2–2.2)
Albumin: 4.4 g/dL (ref 3.8–4.9)
Alkaline Phosphatase: 98 IU/L (ref 44–121)
BUN/Creatinine Ratio: 42 — ABNORMAL HIGH (ref 9–23)
BUN: 25 mg/dL — ABNORMAL HIGH (ref 6–24)
Bilirubin Total: 0.4 mg/dL (ref 0.0–1.2)
CO2: 24 mmol/L (ref 20–29)
Calcium: 9.2 mg/dL (ref 8.7–10.2)
Chloride: 102 mmol/L (ref 96–106)
Creatinine, Ser: 0.6 mg/dL (ref 0.57–1.00)
Globulin, Total: 2.7 g/dL (ref 1.5–4.5)
Glucose: 108 mg/dL — ABNORMAL HIGH (ref 65–99)
Potassium: 4.4 mmol/L (ref 3.5–5.2)
Sodium: 141 mmol/L (ref 134–144)
Total Protein: 7.1 g/dL (ref 6.0–8.5)
eGFR: 104 mL/min/{1.73_m2} (ref >59)

## 2020-12-19 LAB — CBC WITH DIFFERENTIAL/PLATELET
Basophils Absolute: 0.1 10*3/uL (ref 0.0–0.2)
Basos: 1 %
EOS (ABSOLUTE): 0.1 10*3/uL (ref 0.0–0.4)
Eos: 2 %
Hematocrit: 45.2 % (ref 34.0–46.6)
Hemoglobin: 15.3 g/dL (ref 11.1–15.9)
Immature Grans (Abs): 0 10*3/uL (ref 0.0–0.1)
Immature Granulocytes: 0 %
Lymphocytes Absolute: 2.1 10*3/uL (ref 0.7–3.1)
Lymphs: 26 %
MCH: 30.7 pg (ref 26.6–33.0)
MCHC: 33.8 g/dL (ref 31.5–35.7)
MCV: 91 fL (ref 79–97)
Monocytes Absolute: 0.7 10*3/uL (ref 0.1–0.9)
Monocytes: 9 %
Neutrophils Absolute: 5.2 10*3/uL (ref 1.4–7.0)
Neutrophils: 62 %
Platelets: 262 10*3/uL (ref 150–450)
RBC: 4.98 x10E6/uL (ref 3.77–5.28)
RDW: 12.4 % (ref 11.7–15.4)
WBC: 8.2 10*3/uL (ref 3.4–10.8)

## 2020-12-19 LAB — LIPID PANEL WITH LDL/HDL RATIO
Cholesterol, Total: 185 mg/dL (ref 100–199)
HDL: 64 mg/dL (ref >39)
LDL Chol Calc (NIH): 101 mg/dL — ABNORMAL HIGH (ref 0–99)
LDL/HDL Ratio: 1.6 ratio (ref 0.0–3.2)
Triglycerides: 110 mg/dL (ref 0–149)
VLDL Cholesterol Cal: 20 mg/dL (ref 5–40)

## 2020-12-19 LAB — TSH+FREE T4
Free T4: 1.16 ng/dL (ref 0.82–1.77)
TSH: 0.898 u[IU]/mL (ref 0.450–4.500)

## 2020-12-19 LAB — VITAMIN D 25 HYDROXY (VIT D DEFICIENCY, FRACTURES): Vit D, 25-Hydroxy: 42 ng/mL (ref 30.0–100.0)

## 2020-12-20 ENCOUNTER — Other Ambulatory Visit: Payer: Self-pay | Admitting: Family Medicine

## 2020-12-20 DIAGNOSIS — M26609 Unspecified temporomandibular joint disorder, unspecified side: Secondary | ICD-10-CM

## 2020-12-20 NOTE — Telephone Encounter (Signed)
Now sees Lynn Ito PA

## 2020-12-21 ENCOUNTER — Other Ambulatory Visit: Payer: Self-pay | Admitting: Physician Assistant

## 2020-12-21 DIAGNOSIS — R21 Rash and other nonspecific skin eruption: Secondary | ICD-10-CM

## 2020-12-22 ENCOUNTER — Other Ambulatory Visit: Payer: Self-pay | Admitting: Internal Medicine

## 2020-12-22 ENCOUNTER — Encounter: Payer: Self-pay | Admitting: Physician Assistant

## 2020-12-22 MED ORDER — BETAMETHASONE DIPROPIONATE AUG 0.05 % EX OINT: TOPICAL_OINTMENT | Freq: Two times a day (BID) | CUTANEOUS | 0 refills | Status: DC

## 2020-12-22 NOTE — Progress Notes (Signed)
di

## 2020-12-24 ENCOUNTER — Other Ambulatory Visit: Payer: Self-pay | Admitting: Family Medicine

## 2020-12-24 DIAGNOSIS — M26609 Unspecified temporomandibular joint disorder, unspecified side: Secondary | ICD-10-CM

## 2020-12-25 ENCOUNTER — Other Ambulatory Visit: Payer: Self-pay

## 2020-12-25 DIAGNOSIS — M26609 Unspecified temporomandibular joint disorder, unspecified side: Secondary | ICD-10-CM

## 2020-12-26 ENCOUNTER — Other Ambulatory Visit: Payer: Self-pay

## 2020-12-26 DIAGNOSIS — M26609 Unspecified temporomandibular joint disorder, unspecified side: Secondary | ICD-10-CM

## 2020-12-26 MED ORDER — ETODOLAC 400 MG PO TABS: 400.0000 mg | ORAL_TABLET | Freq: Two times a day (BID) | ORAL | 0 refills | Status: DC | PRN

## 2021-01-15 ENCOUNTER — Encounter: Payer: 59 | Admitting: Physician Assistant

## 2021-01-16 ENCOUNTER — Other Ambulatory Visit: Payer: Self-pay

## 2021-01-16 ENCOUNTER — Encounter: Payer: Self-pay | Admitting: Physician Assistant

## 2021-01-16 ENCOUNTER — Ambulatory Visit (INDEPENDENT_AMBULATORY_CARE_PROVIDER_SITE_OTHER): Payer: 59 | Admitting: Physician Assistant

## 2021-01-16 DIAGNOSIS — M170 Bilateral primary osteoarthritis of knee: Secondary | ICD-10-CM | POA: Diagnosis not present

## 2021-01-16 DIAGNOSIS — Z0001 Encounter for general adult medical examination with abnormal findings: Secondary | ICD-10-CM

## 2021-01-16 DIAGNOSIS — I10 Essential (primary) hypertension: Secondary | ICD-10-CM | POA: Diagnosis not present

## 2021-01-16 DIAGNOSIS — K219 Gastro-esophageal reflux disease without esophagitis: Secondary | ICD-10-CM

## 2021-01-16 DIAGNOSIS — E782 Mixed hyperlipidemia: Secondary | ICD-10-CM | POA: Diagnosis not present

## 2021-01-16 DIAGNOSIS — R300 Dysuria: Secondary | ICD-10-CM

## 2021-01-16 DIAGNOSIS — Z6841 Body Mass Index (BMI) 40.0 and over, adult: Secondary | ICD-10-CM

## 2021-01-16 MED ORDER — METOPROLOL SUCCINATE ER 25 MG PO TB24: 50.0000 mg | ORAL_TABLET | Freq: Every day | ORAL | 0 refills | Status: DC

## 2021-01-16 MED ORDER — ATORVASTATIN CALCIUM 20 MG PO TABS: 20.0000 mg | ORAL_TABLET | Freq: Every day | ORAL | 1 refills | Status: DC

## 2021-01-16 MED ORDER — BUPROPION HCL ER (XL) 150 MG PO TB24: 150.0000 mg | ORAL_TABLET | Freq: Every day | ORAL | 1 refills | Status: DC

## 2021-01-16 NOTE — Patient Instructions (Signed)
Obesity, Adult Obesity is having too much body fat. Being obese means that your weight is morethan what is healthy for you. BMI is a number that explains how much body fat you have. If you have a BMI of 30 or more, you are obese. Obesity is often caused by eating or drinking morecalories than your body uses. Changing your lifestyle can help you lose weight. Obesity can cause serious health problems, such as: Stroke. Coronary artery disease (CAD). Type 2 diabetes. Some types of cancer, including cancers of the colon, breast, uterus, and gallbladder. Osteoarthritis. High blood pressure (hypertension). High cholesterol. Sleep apnea. Gallbladder stones. Infertility problems. What are the causes? Eating meals each day that are high in calories, sugar, and fat. Being born with genes that may make you more likely to become obese. Having a medical condition that causes obesity. Taking certain medicines. Sitting a lot (having a sedentary lifestyle). Not getting enough sleep. Drinking a lot of drinks that have sugar in them. What increases the risk? Having a family history of obesity. Being an African American woman. Being a Hispanic man. Living in an area with limited access to: Parks, recreation centers, or sidewalks. Healthy food choices, such as grocery stores and farmers' markets. What are the signs or symptoms? The main sign is having too much body fat. How is this treated? Treatment for this condition often includes changing your lifestyle. Treatment may include: Changing your diet. This may include making a healthy meal plan. Exercise. This may include activity that causes your heart to beat faster (aerobic exercise) and strength training. Work with your doctor to design a program that works for you. Medicine to help you lose weight. This may be used if you are not able to lose 1 pound a week after 6 weeks of healthy eating and more exercise. Treating conditions that cause the  obesity. Surgery. Options may include gastric banding and gastric bypass. This may be done if: Other treatments have not helped to improve your condition. You have a BMI of 40 or higher. You have life-threatening health problems related to obesity. Follow these instructions at home: Eating and drinking  Follow advice from your doctor about what to eat and drink. Your doctor may tell you to: Limit fast food, sweets, and processed snack foods. Choose low-fat options. For example, choose low-fat milk instead of whole milk. Eat 5 or more servings of fruits or vegetables each day. Eat at home more often. This gives you more control over what you eat. Choose healthy foods when you eat out. Learn to read food labels. This will help you learn how much food is in 1 serving. Keep low-fat snacks available. Avoid drinks that have a lot of sugar in them. These include soda, fruit juice, iced tea with sugar, and flavored milk. Drink enough water to keep your pee (urine) pale yellow. Do not go on fad diets.  Physical activity Exercise often, as told by your doctor. Most adults should get up to 150 minutes of moderate-intensity exercise every week.Ask your doctor: What types of exercise are safe for you. How often you should exercise. Warm up and stretch before being active. Do slow stretching after being active (cool down). Rest between times of being active. Lifestyle Work with your doctor and a food expert (dietitian) to set a weight-loss goal that is best for you. Limit your screen time. Find ways to reward yourself that do not involve food. Do not drink alcohol if: Your doctor tells you not to drink.   You are pregnant, may be pregnant, or are planning to become pregnant. If you drink alcohol: Limit how much you use to: 0-1 drink a day for women. 0-2 drinks a day for men. Be aware of how much alcohol is in your drink. In the [place] one drink equals one 12 oz bottle of beer (355 mL), one 5 oz  glass of wine (148 mL), or one 1 oz glass of hard liquor (44 mL). General instructions Keep a weight-loss journal. This can help you keep track of: The food that you eat. How much exercise you get. Take over-the-counter and prescription medicines only as told by your doctor. Take vitamins and supplements only as told by your doctor. Think about joining a support group. Keep all follow-up visits as told by your doctor. This is important. Contact a doctor if: You cannot meet your weight loss goal after you have changed your diet and lifestyle for 6 weeks. Get help right away if you: Are having trouble breathing. Are having thoughts of harming yourself. Summary Obesity is having too much body fat. Being obese means that your weight is more than what is healthy for you. Work with your doctor to set a weight-loss goal. Get regular exercise as told by your doctor. This information is not intended to replace advice given to you by your health care provider. Make sure you discuss any questions you have with your healthcare provider. Document Revised: 01/26/2018 Document Reviewed: 01/26/2018 Elsevier Patient Education  2022 [employer]vier Inc.  

## 2021-01-16 NOTE — Progress Notes (Signed)
[facility], [place] [address]  Internal MEDICINE  Office Visit Note  Patient Name: Mandy Baldwin  [id number]  [id number]  Date of Service: 01/16/2021  Chief Complaint  Patient presents with   Annual Exam   Hypertension   Gastroesophageal Reflux     HPI Pt is here for routine health maintenance examination -Has seen ENT and they found nothing is wrong with her ear and did a full work up with hearing test too. Thinks it is allergy related and is taking an antihistamine now. -She received Cortisone shots in both knees for her arthritis yesterday -BP at home 115-130/70-80s -She had her second covid booster in June stil has red mark on her arm--not itchy, painful or bothersome -GERD well controlled on her protonix and if she avoids  certain foods -Elevated fasting sugar on recent labs but A1c previously checked was normal and other labs were normal apart from slightly elevated LDL which she will continue to work on with diet. -She will be due for her mammogram In November. She is UTD on colonoscopy -She is trying to work on weight loss but states no matter how well she eats she doesn't seem to lose weight. Exercise is difficult due to her arthritis bu states she will try walking on the treadmill at home and using her stationary bike to help reduce joint impact. We will also try wellbutrin to aid in wt loss.  Current Medication: Outpatient Encounter Medications as of 01/16/2021  Medication Sig   amoxicillin-clavulanate (AUGMENTIN) 875-125 MG tablet Take 1 tablet by mouth 2 (two) times daily.   aspirin 81 MG tablet Take 81 mg by mouth daily.   augmented betamethasone dipropionate (DIPROLENE) 0.05 % ointment Apply topically 2 (two) times daily.   buPROPion (WELLBUTRIN XL) 150 MG 24 hr tablet Take 1 tablet (150 mg total) by mouth daily.   cholecalciferol (VITAMIN D) 1000 UNITS tablet Take 2,000 Units by mouth daily.   cyclobenzaprine (FLEXERIL) 5  MG tablet Take 1 tablet (5 mg total) by mouth 3 (three) times daily as needed for muscle spasms.   etodolac (LODINE) 400 MG tablet Take 1 tablet (400 mg total) by mouth 2 (two) times daily as needed.   fluticasone (FLONASE) 50 MCG/ACT nasal spray Use 2 spray(s) in each nostril once daily   magnesium oxide (MAG-OX) 400 MG tablet Take 400 mg by mouth daily.   Multiple Vitamins-Minerals (MULTIVITAL PO) Take 1 tablet by mouth daily.   pantoprazole (PROTONIX) 40 MG tablet Take 1 tablet (40 mg total) by mouth 2 (two) times daily.   promethazine (PHENERGAN) 12.5 MG tablet TAKE 1 TABLET BY MOUTH EVERY 8 HOURS AS NEEDED FOR NAUSEA AND VOMITING   triamcinolone (KENALOG) 0.1 % APPLY  CREAM EXTERNALLY TWICE DAILY   triamterene-hydrochlorothiazide (MAXZIDE-25) 37.5-25 MG tablet TAKE 1 TABLET BY MOUTH ONCE DAILY (MUST  HAVE  OFFICE  VISIT  PRIOR  TO  FURTHER  REFILLS)   [DISCONTINUED] levofloxacin (LEVAQUIN) 500 MG tablet Take 1 tablet (500 mg total) by mouth daily.   [DISCONTINUED] metoprolol succinate (TOPROL-XL) 25 MG 24 hr tablet Take 2 tablets (50 mg total) by mouth daily.   [DISCONTINUED] neomycin-polymyxin-hydrocortisone (CORTISPORIN) OTIC solution Place 3 drops into the left ear 3 (three) times daily.   atorvastatin (LIPITOR) 20 MG tablet Take 1 tablet (20 mg total) by mouth daily. pm   metoprolol succinate (TOPROL-XL) 25 MG 24 hr tablet Take 2 tablets (50 mg total) by mouth daily.   [DISCONTINUED] atorvastatin (  LIPITOR) 20 MG tablet Take by mouth daily. pm   No facility-administered encounter medications on file as of 01/16/2021.    Surgical History: Past Surgical History:  Procedure Laterality Date   COLONOSCOPY     COLONOSCOPY WITH PROPOFOL N/A 02/15/2020   Procedure: COLONOSCOPY WITH PROPOFOL;  Surgeon: Lucilla Lame, MD;  Location: [place];  Service: Endoscopy;  Laterality: N/A;  priority 4   ESOPHAGOGASTRODUODENOSCOPY (EGD) WITH PROPOFOL N/A 09/18/2015   Procedure:  ESOPHAGOGASTRODUODENOSCOPY (EGD) WITH PROPOFOL with ballon dilatation;  Surgeon: Lucilla Lame, MD;  Location: [place];  Service: Endoscopy;  Laterality: N/A;   LAPAROSCOPIC OVARIAN      Medical History: Past Medical History:  Diagnosis Date   GERD (gastroesophageal reflux disease)    Hyperlipidemia    Hypertension    Knee pain    right knee, bone on bone   Motion [place]   Osteoarthritis of both knees    Shingles 12/14/2014   TMJ (dislocation of temporomandibular joint)     Family History: Family History  Problem Relation Age of Onset   Dementia Mother    Aortic aneurysm Mother    COPD Father    Breast cancer Sister 41   Diabetes Sister    COPD Brother    Cancer Brother    Stroke Sister       Review of Systems  Constitutional:  Negative for chills, fatigue and unexpected weight change.  HENT:  Negative for congestion, postnasal drip, rhinorrhea, sneezing and sore throat.   Eyes:  Negative for redness.  Respiratory:  Negative for cough, chest tightness and shortness of breath.   Cardiovascular:  Negative for chest pain and palpitations.  Gastrointestinal:  Negative for abdominal pain, constipation, diarrhea, nausea and vomiting.  Genitourinary:  Negative for dysuria and frequency.  Musculoskeletal:  Positive for arthralgias. Negative for back pain, joint swelling and neck pain.  Skin:  Negative for rash.  Neurological: Negative.  Negative for tremors and numbness.  Hematological:  Negative for adenopathy. Does not bruise/bleed easily.  Psychiatric/Behavioral:  Negative for behavioral problems (Depression), sleep disturbance and suicidal ideas. The patient is not nervous/anxious.     Vital Signs: BP 138/84   Pulse 70   Temp 97.8 F (36.6 C)   Resp 16   Ht 5' 3"  (1.6 m)   Wt (!) 307 lb (139.3 kg)   SpO2 98%   BMI 54.38 kg/m    Physical Exam Vitals and nursing note reviewed.  Constitutional:      General: She is not in acute  distress.    Appearance: She is well-developed. She is obese. She is not diaphoretic.  HENT:     Head: Normocephalic and atraumatic.     Mouth/Throat:     Pharynx: No oropharyngeal exudate.  Eyes:     Pupils: Pupils are equal, round, and reactive to light.  Neck:     Thyroid: No thyromegaly.     Vascular: No JVD.     Trachea: No tracheal deviation.  Cardiovascular:     Rate and Rhythm: Normal rate and regular rhythm.     Heart sounds: Normal heart sounds. No murmur heard.   No friction rub. No gallop.  Pulmonary:     Effort: Pulmonary effort is normal. No respiratory distress.     Breath sounds: No wheezing or rales.  Chest:     Chest wall: No tenderness.  Breasts:    Right: Normal. No mass.     Left:  Normal. No mass.  Abdominal:     General: Bowel sounds are normal.     Palpations: Abdomen is soft.  Musculoskeletal:        General: Normal range of motion.     Cervical back: Normal range of motion and neck supple.  Lymphadenopathy:     Cervical: No cervical adenopathy.  Skin:    General: Skin is warm and dry.  Neurological:     Mental Status: She is alert and oriented to person, place, and time.     Cranial Nerves: No cranial nerve deficit.  Psychiatric:        Behavior: Behavior normal.        Thought Content: Thought content normal.        Judgment: Judgment normal.     LABS: Recent Results (from the past 2160 hour(s))  CBC w/Diff/Platelet     Status: None   Collection Time: 12/18/20  9:41 AM  Result Value Ref Range   WBC 8.2 3.4 - 10.8 x10E3/uL   RBC 4.98 3.77 - 5.28 x10E6/uL   Hemoglobin 15.3 11.1 - 15.9 g/dL   Hematocrit 45.2 34.0 - 46.6 %   MCV 91 79 - 97 fL   MCH 30.7 26.6 - 33.0 pg   MCHC 33.8 31.5 - 35.7 g/dL   RDW 12.4 11.7 - 15.4 %   Platelets 262 150 - 450 x10E3/uL   Neutrophils 62 Not Estab. %   Lymphs 26 Not Estab. %   Monocytes 9 Not Estab. %   Eos 2 Not Estab. %   Basos 1 Not Estab. %   Neutrophils Absolute 5.2 1.4 - 7.0 x10E3/uL    Lymphocytes Absolute 2.1 0.7 - 3.1 x10E3/uL   Monocytes Absolute 0.7 0.1 - 0.9 x10E3/uL   EOS (ABSOLUTE) 0.1 0.0 - 0.4 x10E3/uL   Basophils Absolute 0.1 0.0 - 0.2 x10E3/uL   Immature Granulocytes 0 Not Estab. %   Immature Grans (Abs) 0.0 0.0 - 0.1 x10E3/uL  Comprehensive metabolic panel     Status: Abnormal   Collection Time: 12/18/20  9:41 AM  Result Value Ref Range   Glucose 108 (H) 65 - 99 mg/dL   BUN 25 (H) 6 - 24 mg/dL   Creatinine, Ser 0.60 0.57 - 1.00 mg/dL   eGFR 104 >59 mL/min/1.73   BUN/Creatinine Ratio 42 (H) 9 - 23   Sodium 141 134 - 144 mmol/L   Potassium 4.4 3.5 - 5.2 mmol/L   Chloride 102 96 - 106 mmol/L   CO2 24 20 - 29 mmol/L   Calcium 9.2 8.7 - 10.2 mg/dL   Total Protein 7.1 6.0 - 8.5 g/dL   Albumin 4.4 3.8 - 4.9 g/dL   Globulin, Total 2.7 1.5 - 4.5 g/dL   Albumin/Globulin Ratio 1.6 1.2 - 2.2   Bilirubin Total 0.4 0.0 - 1.2 mg/dL   Alkaline Phosphatase 98 44 - 121 IU/L   AST 19 0 - 40 IU/L   ALT 25 0 - 32 IU/L  Lipid Panel With LDL/HDL Ratio     Status: Abnormal   Collection Time: 12/18/20  9:41 AM  Result Value Ref Range   Cholesterol, Total 185 100 - 199 mg/dL   Triglycerides 110 0 - 149 mg/dL   HDL 64 >39 mg/dL   VLDL Cholesterol Cal 20 5 - 40 mg/dL   LDL Chol Calc (NIH) 101 (H) 0 - 99 mg/dL   LDL/HDL Ratio 1.6 0.0 - 3.2 ratio    Comment:  LDL/HDL Ratio                                             Men  Women                               1/2 Avg.Risk  1.0    1.5                                   Avg.Risk  3.6    3.2                                2X Avg.Risk  6.2    5.0                                3X Avg.Risk  8.0    6.1   TSH + free T4     Status: None   Collection Time: 12/18/20  9:41 AM  Result Value Ref Range   TSH 0.898 0.450 - 4.500 uIU/mL   Free T4 1.16 0.82 - 1.77 ng/dL  VITAMIN D 25 Hydroxy (Vit-D Deficiency, Fractures)     Status: None   Collection Time: 12/18/20  9:41 AM  Result Value Ref Range    Vit D, 25-Hydroxy 42.0 30.0 - 100.0 ng/mL    Comment: Vitamin D deficiency has been defined by the [place] and an Endocrine Society practice guideline as a level of serum 25-OH vitamin D less than 20 ng/mL (1,2). The Endocrine Society went on to further define vitamin D insufficiency as a level between 21 and 29 ng/mL (2). 1. IOM (Institute of Medicine). 2010. Dietary reference    intakes for calcium and D. [place]: The    [employer]. 2. Holick MF, Binkley Hinckley, Bischoff-Ferrari HA, et al.    Evaluation, treatment, and prevention of vitamin D    deficiency: an Endocrine Society clinical practice    guideline. JCEM. 2011 Jul; 96(7):1911-30.         Assessment/Plan: 1. Encounter for general adult medical examination with abnormal findings  2. Essential hypertension Stable, will continue current medications - metoprolol succinate (TOPROL-XL) 25 MG 24 hr tablet; Take 2 tablets (50 mg total) by mouth daily.  Dispense: 180 tablet; Refill: 0  3. Mixed hyperlipidemia Labs are stable continue Lipitor - atorvastatin (LIPITOR) 20 MG tablet; Take 1 tablet (20 mg total) by mouth daily. pm  Dispense: 90 tablet; Refill: 1  4. Gastroesophageal reflux disease without esophagitis Continue Protonix and avoiding foods that trigger GERD  5. Arthritis of both knees Followed by Ortho  6. Morbid obesity with BMI of 50.0-59.9, adult [facility]) Patient will work on increasing exercise with treadmill and stationary bike as able and will continue to work on diet and portion control.  We will try Wellbutrin to aid in weight loss goals - buPROPion (WELLBUTRIN XL) 150 MG 24 hr tablet; Take 1 tablet (150 mg total) by mouth daily.  Dispense: 30 tablet; Refill: 1 Obesity Counseling: Had a lengthy discussion regarding patients BMI and weight issues. Patient was instructed on portion control as well as increased activity. Also  discussed caloric restrictions with trying to maintain  intake less than 2000 Kcal. Discussions were made in accordance with the 5As of weight management. Simple actions such as not eating late and if able to, taking a walk is suggested.  7. Dysuria     General Counseling: Glynis verbalizes understanding of the findings of todays visit and agrees with plan of treatment. I have discussed any further diagnostic evaluation that may be needed or ordered today. We also reviewed her medications today. she has been encouraged to call the office with any questions or concerns that should arise related to todays visit.    Counseling:    No orders of the defined types were placed in this encounter.   Meds ordered this encounter  Medications   metoprolol succinate (TOPROL-XL) 25 MG 24 hr tablet    Sig: Take 2 tablets (50 mg total) by mouth daily.    Dispense:  180 tablet    Refill:  0   atorvastatin (LIPITOR) 20 MG tablet    Sig: Take 1 tablet (20 mg total) by mouth daily. pm    Dispense:  90 tablet    Refill:  1   buPROPion (WELLBUTRIN XL) 150 MG 24 hr tablet    Sig: Take 1 tablet (150 mg total) by mouth daily.    Dispense:  30 tablet    Refill:  1    This patient was seen by Drema Dallas, PA-C in collaboration with Dr. Clayborn Bigness as a part of collaborative care agreement.  Total time spent:35 Minutes  Time spent includes review of chart, medications, test results, and follow up plan with the patient.     Lavera Guise, MD  Internal Medicine

## 2021-01-19 ENCOUNTER — Other Ambulatory Visit: Payer: Self-pay | Admitting: Internal Medicine

## 2021-01-19 DIAGNOSIS — M26609 Unspecified temporomandibular joint disorder, unspecified side: Secondary | ICD-10-CM

## 2021-01-20 ENCOUNTER — Encounter: Payer: Self-pay | Admitting: Physician Assistant

## 2021-02-18 ENCOUNTER — Other Ambulatory Visit: Payer: Self-pay | Admitting: Physician Assistant

## 2021-02-18 DIAGNOSIS — I10 Essential (primary) hypertension: Secondary | ICD-10-CM

## 2021-02-20 ENCOUNTER — Encounter: Payer: Self-pay | Admitting: Physician Assistant

## 2021-02-20 ENCOUNTER — Other Ambulatory Visit: Payer: Self-pay

## 2021-02-20 ENCOUNTER — Ambulatory Visit (INDEPENDENT_AMBULATORY_CARE_PROVIDER_SITE_OTHER): Payer: 59 | Admitting: Physician Assistant

## 2021-02-20 DIAGNOSIS — E782 Mixed hyperlipidemia: Secondary | ICD-10-CM

## 2021-02-20 DIAGNOSIS — Z23 Encounter for immunization: Secondary | ICD-10-CM | POA: Diagnosis not present

## 2021-02-20 DIAGNOSIS — M170 Bilateral primary osteoarthritis of knee: Secondary | ICD-10-CM | POA: Diagnosis not present

## 2021-02-20 DIAGNOSIS — Z6841 Body Mass Index (BMI) 40.0 and over, adult: Secondary | ICD-10-CM

## 2021-02-20 DIAGNOSIS — I10 Essential (primary) hypertension: Secondary | ICD-10-CM

## 2021-02-20 MED ORDER — BUPROPION HCL 75 MG PO TABS: 75.0000 mg | ORAL_TABLET | Freq: Every day | ORAL | 0 refills | Status: DC

## 2021-02-20 NOTE — Progress Notes (Signed)
[facility] [address] [place], [place] [address]  Internal MEDICINE  Office Visit Note  Patient Name: Mandy Baldwin  [id number]  [id number]  Date of Service: 02/20/2021  Chief Complaint  Patient presents with   Follow-up   Medical Management of Chronic Issues    Weight management   Hyperlipidemia   Hypertension    HPI Pt is here for routine follow up and has no complaints -Has been working on diet and is taking 1/2 tab of wellbutrin and tolerating it well. The full 150mg  tab was keeping her awake at night even when she took it at 7am. 75mg  dose does not affect sleep and has helped with weight loss. She has lot 9lbs since last visit and can tell a difference in waist. Will send script for the lower dose now. -Does a lot of walking and will try stationary biking at home. She is limited by OA in knees however states that weight loss has already helped her knees feel a little better -BP at home 120-126/<80, does state she rushed here on her lunch break so BO a little higher due to that  Current Medication: Outpatient Encounter Medications as of 02/20/2021  Medication Sig   aspirin 81 MG tablet Take 81 mg by mouth daily.   atorvastatin (LIPITOR) 20 MG tablet Take 1 tablet (20 mg total) by mouth daily. pm   buPROPion (WELLBUTRIN) 75 MG tablet Take 1 tablet (75 mg total) by mouth daily.   cholecalciferol (VITAMIN D) 1000 UNITS tablet Take 2,000 Units by mouth daily.   cyclobenzaprine (FLEXERIL) 5 MG tablet Take 1 tablet (5 mg total) by mouth 3 (three) times daily as needed for muscle spasms.   etodolac (LODINE) 400 MG tablet Take 1 tablet by mouth twice daily as needed   fluticasone (FLONASE) 50 MCG/ACT nasal spray Use 2 spray(s) in each nostril once daily   magnesium oxide (MAG-OX) 400 MG tablet Take 400 mg by mouth daily.   metoprolol succinate (TOPROL-XL) 25 MG 24 hr tablet Take 2 tablets (50 mg total) by mouth daily.   Multiple Vitamins-Minerals (MULTIVITAL PO)  Take 1 tablet by mouth daily.   pantoprazole (PROTONIX) 40 MG tablet Take 1 tablet (40 mg total) by mouth 2 (two) times daily.   promethazine (PHENERGAN) 12.5 MG tablet TAKE 1 TABLET BY MOUTH EVERY 8 HOURS AS NEEDED FOR NAUSEA AND VOMITING   triamcinolone (KENALOG) 0.1 % APPLY  CREAM EXTERNALLY TWICE DAILY   triamterene-hydrochlorothiazide (MAXZIDE-25) 37.5-25 MG tablet TAKE 1 TABLET BY MOUTH ONCE DAILY MUST  HAVE  OFFICE  VISIT  PRIOR  TO  FUTURE  REFILLS   [DISCONTINUED] amoxicillin-clavulanate (AUGMENTIN) 875-125 MG tablet Take 1 tablet by mouth 2 (two) times daily.   [DISCONTINUED] buPROPion (WELLBUTRIN XL) 150 MG 24 hr tablet Take 1 tablet (150 mg total) by mouth daily. (Patient taking differently: Take 75 mg by mouth daily.)   [DISCONTINUED] augmented betamethasone dipropionate (DIPROLENE) 0.05 % ointment Apply topically 2 (two) times daily. (Patient not taking: Reported on 02/20/2021)   No facility-administered encounter medications on file as of 02/20/2021.    Surgical History: Past Surgical History:  Procedure Laterality Date   COLONOSCOPY     COLONOSCOPY WITH PROPOFOL N/A 02/15/2020   Procedure: COLONOSCOPY WITH PROPOFOL;  Surgeon: 02/22/2021, MD;  Location: [facility];  Service: Endoscopy;  Laterality: N/A;  priority 4   ESOPHAGOGASTRODUODENOSCOPY (EGD) WITH PROPOFOL N/A 09/18/2015   Procedure: ESOPHAGOGASTRODUODENOSCOPY (EGD) WITH PROPOFOL with ballon dilatation;  Surgeon: [facility],  MD;  Location: [facility];  Service: Endoscopy;  Laterality: N/A;   LAPAROSCOPIC OVARIAN      Medical History: Past Medical History:  Diagnosis Date   GERD (gastroesophageal reflux disease)    Hyperlipidemia    Hypertension    Knee pain    right knee, bone on bone   Motion sickness    [place]   Osteoarthritis of both knees    Shingles 12/14/2014   TMJ (dislocation of temporomandibular joint)     Family History: Family History  Problem Relation Age of Onset    Dementia Mother    Aortic aneurysm Mother    COPD Father    Breast cancer Sister 32   Diabetes Sister    COPD Brother    Cancer Brother    Stroke Sister     Social History   Socioeconomic History   Marital status: Married    Spouse name: Lanecia Sliva   Number of children: 0   Years of education: 13   Highest education level: Not on file  Occupational History   Occupation: Clinical biochemist    Comment: Production manager: [employer]  Tobacco Use   Smoking status: Never   Smokeless tobacco: Never  Vaping Use   Vaping Use: Never used  Substance and Sexual Activity   Alcohol use: No   Drug use: No   Sexual activity: Not on file  Other Topics Concern   Not on file  Social History Narrative   Not on file   Social Determinants of Health   Financial Resource Strain: Not on file  Food Insecurity: Not on file  Transportation Needs: Not on file  Physical Activity: Not on file  Stress: Not on file  Social Connections: Not on file  Intimate Partner Violence: Not on file      Review of Systems  Constitutional:  Negative for chills, fatigue and unexpected weight change.  HENT:  Negative for congestion, postnasal drip, rhinorrhea, sneezing and sore throat.   Eyes:  Negative for redness.  Respiratory:  Negative for cough, chest tightness and shortness of breath.   Cardiovascular:  Negative for chest pain and palpitations.  Gastrointestinal:  Negative for abdominal pain, constipation, diarrhea, nausea and vomiting.  Genitourinary:  Negative for dysuria and frequency.  Musculoskeletal:  Positive for arthralgias. Negative for back pain, joint swelling and neck pain.  Skin:  Negative for rash.  Neurological: Negative.  Negative for tremors and numbness.  Hematological:  Negative for adenopathy. Does not bruise/bleed easily.  Psychiatric/Behavioral:  Negative for behavioral problems (Depression), sleep disturbance and suicidal ideas. The patient is not  nervous/anxious.    Vital Signs: BP 130/90   Pulse 66   Temp 98.3 F (36.8 C)   Resp 16   Ht 5\' 3"  (1.6 m)   Wt 298 lb 6.4 oz (135.4 kg)   SpO2 99%   BMI 52.86 kg/m    Physical Exam Vitals and nursing note reviewed.  Constitutional:      General: She is not in acute distress.    Appearance: She is well-developed. She is obese. She is not diaphoretic.  HENT:     Head: Normocephalic and atraumatic.     Mouth/Throat:     Pharynx: No oropharyngeal exudate.  Eyes:     Pupils: Pupils are equal, round, and reactive to light.  Neck:     Thyroid: No thyromegaly.     Vascular: No JVD.     Trachea: No tracheal  deviation.  Cardiovascular:     Rate and Rhythm: Normal rate and regular rhythm.     Heart sounds: Normal heart sounds. No murmur heard.   No friction rub. No gallop.  Pulmonary:     Effort: Pulmonary effort is normal. No respiratory distress.     Breath sounds: No wheezing or rales.  Chest:     Chest wall: No tenderness.  Breasts:    Right: Normal. No mass.     Left: Normal. No mass.  Abdominal:     General: Bowel sounds are normal.     Palpations: Abdomen is soft.  Musculoskeletal:        General: Normal range of motion.     Cervical back: Normal range of motion and neck supple.  Lymphadenopathy:     Cervical: No cervical adenopathy.  Skin:    General: Skin is warm and dry.  Neurological:     Mental Status: She is alert and oriented to person, place, and time.     Cranial Nerves: No cranial nerve deficit.  Psychiatric:        Behavior: Behavior normal.        Thought Content: Thought content normal.        Judgment: Judgment normal.       Assessment/Plan: 1. Essential hypertension Stable, continue current medication and monitor at home  2. Mixed hyperlipidemia Continue lipitor  3. Arthritis of both knees Followed by ortho  4. Morbid obesity with BMI of 50.0-59.9, adult [facility]) Patient is working on diet and exercise and may continue 75mg   wellbutrin. Has lost 9lbs since last visit. - buPROPion (WELLBUTRIN) 75 MG tablet; Take 1 tablet (75 mg total) by mouth daily.  Dispense: 90 tablet; Refill: 0  5. Needs flu shot - Flu Vaccine MDCK QUAD PF   General Counseling: Quiara verbalizes understanding of the findings of todays visit and agrees with plan of treatment. I have discussed any further diagnostic evaluation that may be needed or ordered today. We also reviewed her medications today. she has been encouraged to call the office with any questions or concerns that should arise related to todays visit.    Orders Placed This Encounter  Procedures   Flu Vaccine MDCK QUAD PF    Meds ordered this encounter  Medications   buPROPion (WELLBUTRIN) 75 MG tablet    Sig: Take 1 tablet (75 mg total) by mouth daily.    Dispense:  90 tablet    Refill:  0    This patient was seen by , PA-C in collaboration with Dr. Lynn Ito as a part of collaborative care agreement.   Total time spent:30 Minutes Time spent includes review of chart, medications, test results, and follow up plan with the patient.      Dr Beverely Risen Internal medicine

## 2021-02-21 ENCOUNTER — Encounter: Payer: Self-pay | Admitting: Physician Assistant

## 2021-02-23 ENCOUNTER — Other Ambulatory Visit: Payer: Self-pay | Admitting: Physician Assistant

## 2021-02-23 DIAGNOSIS — M170 Bilateral primary osteoarthritis of knee: Secondary | ICD-10-CM

## 2021-02-23 MED ORDER — MELOXICAM 7.5 MG PO TABS: 7.5000 mg | ORAL_TABLET | Freq: Every day | ORAL | 0 refills | Status: DC

## 2021-04-03 ENCOUNTER — Other Ambulatory Visit: Payer: Self-pay | Admitting: Physician Assistant

## 2021-04-03 DIAGNOSIS — K2100 Gastro-esophageal reflux disease with esophagitis, without bleeding: Secondary | ICD-10-CM

## 2021-04-03 DIAGNOSIS — M170 Bilateral primary osteoarthritis of knee: Secondary | ICD-10-CM

## 2021-04-05 ENCOUNTER — Other Ambulatory Visit: Payer: Self-pay

## 2021-04-05 DIAGNOSIS — M170 Bilateral primary osteoarthritis of knee: Secondary | ICD-10-CM

## 2021-04-05 DIAGNOSIS — K2100 Gastro-esophageal reflux disease with esophagitis, without bleeding: Secondary | ICD-10-CM

## 2021-04-05 MED ORDER — PANTOPRAZOLE SODIUM 40 MG PO TBEC: 40.0000 mg | DELAYED_RELEASE_TABLET | Freq: Two times a day (BID) | ORAL | 1 refills | Status: DC

## 2021-04-05 MED ORDER — MELOXICAM 7.5 MG PO TABS: 7.5000 mg | ORAL_TABLET | Freq: Every day | ORAL | 1 refills | Status: DC

## 2021-04-15 ENCOUNTER — Other Ambulatory Visit: Payer: Self-pay | Admitting: Physician Assistant

## 2021-04-15 DIAGNOSIS — I10 Essential (primary) hypertension: Secondary | ICD-10-CM

## 2021-04-24 ENCOUNTER — Ambulatory Visit: Payer: 59 | Admitting: Physician Assistant

## 2021-05-17 ENCOUNTER — Other Ambulatory Visit: Payer: Self-pay | Admitting: Physician Assistant

## 2021-05-17 DIAGNOSIS — Z6841 Body Mass Index (BMI) 40.0 and over, adult: Secondary | ICD-10-CM

## 2021-05-19 ENCOUNTER — Other Ambulatory Visit: Payer: Self-pay | Admitting: Physician Assistant

## 2021-05-19 DIAGNOSIS — Z1231 Encounter for screening mammogram for malignant neoplasm of breast: Secondary | ICD-10-CM

## 2021-05-28 ENCOUNTER — Telehealth: Payer: 59 | Admitting: Physician Assistant

## 2021-05-28 ENCOUNTER — Telehealth: Payer: Self-pay

## 2021-05-28 ENCOUNTER — Encounter: Payer: Self-pay | Admitting: Physician Assistant

## 2021-05-28 VITALS — BP 128/77 | HR 66 | Temp 98.3°F

## 2021-05-28 DIAGNOSIS — Z20822 Contact with and (suspected) exposure to covid-19: Secondary | ICD-10-CM | POA: Diagnosis not present

## 2021-05-28 DIAGNOSIS — R058 Other specified cough: Secondary | ICD-10-CM | POA: Diagnosis not present

## 2021-05-28 MED ORDER — AZITHROMYCIN 250 MG PO TABS: ORAL_TABLET | ORAL | 0 refills | Status: DC

## 2021-05-28 MED ORDER — PREDNISONE 10 MG PO TABS: ORAL_TABLET | ORAL | 0 refills | Status: DC

## 2021-05-28 NOTE — Telephone Encounter (Signed)
Pt called that phar was telling her to take 3 tab together due may be she can have difficulty sleeping as per lauren take as prescribed and call us back if she can having trouble sleeping

## 2021-05-28 NOTE — Progress Notes (Signed)
[facility] [address] [place], [place] [address]  Internal MEDICINE  Telephone Visit  Patient Name: Mandy Baldwin  [id number]  [id number]  Date of Service: 05/28/2021  I connected with the patient at 10:50 by telephone and verified the patients identity using two identifiers.   I discussed the limitations, risks, security and privacy concerns of performing an evaluation and management service by telephone and the availability of in person appointments. I also discussed with the patient that there may be a patient responsible charge related to the service.  The patient expressed understanding and agrees to proceed.    Chief Complaint  Patient presents with   Telephone Assessment   Telephone Screen    [phone]    Cough    Symptoms started about a week ago. Pt tested at home and was negative but has been exposed   Headache   Nausea   Diarrhea   covis positive    Husband was positive and in the hospital, taking paxlovid    HPI Pt is here here for virtual sick vist -Husband dx with covid tues and stayed one night in the hospital and was given paxlovid -Her home test was negative, but she has been having some symptoms for the last week including headache, congestion, nausea, diarrhea, chills, cough and some mild SOB. Denies any fever -Tried mucinex DM but did not help much -She is fully vaccinated to covid and had her flu shot as well  Current Medication: Outpatient Encounter Medications as of 05/28/2021  Medication Sig   aspirin 81 MG tablet Take 81 mg by mouth daily.   atorvastatin (LIPITOR) 20 MG tablet Take 1 tablet (20 mg total) by mouth daily. pm   azithromycin (ZITHROMAX) 250 MG tablet Take one tab a day for 10 days for uri   buPROPion (WELLBUTRIN) 75 MG tablet Take 1 tablet by mouth once daily   cholecalciferol (VITAMIN D) 1000 UNITS tablet Take 2,000 Units by mouth daily.   cyclobenzaprine (FLEXERIL) 5 MG tablet Take 1 tablet (5 mg total) by  mouth 3 (three) times daily as needed for muscle spasms.   fluticasone (FLONASE) 50 MCG/ACT nasal spray Use 2 spray(s) in each nostril once daily   magnesium oxide (MAG-OX) 400 MG tablet Take 400 mg by mouth daily.   meloxicam (MOBIC) 7.5 MG tablet Take 1 tablet (7.5 mg total) by mouth daily.   metoprolol succinate (TOPROL-XL) 25 MG 24 hr tablet Take 2 tablets by mouth once daily   Multiple Vitamins-Minerals (MULTIVITAL PO) Take 1 tablet by mouth daily.   pantoprazole (PROTONIX) 40 MG tablet Take 1 tablet (40 mg total) by mouth 2 (two) times daily.   predniSONE (DELTASONE) 10 MG tablet Take one tab 3 x day for 3 days, then take one tab 2 x a day for 3 days and then take one tab a day for 3 days for copd   promethazine (PHENERGAN) 12.5 MG tablet TAKE 1 TABLET BY MOUTH EVERY 8 HOURS AS NEEDED FOR NAUSEA AND VOMITING   triamcinolone (KENALOG) 0.1 % APPLY  CREAM EXTERNALLY TWICE DAILY   triamterene-hydrochlorothiazide (MAXZIDE-25) 37.5-25 MG tablet TAKE 1 TABLET BY MOUTH ONCE DAILY MUST  HAVE  OFFICE  VISIT  PRIOR  TO  FUTURE  REFILLS   No facility-administered encounter medications on file as of 05/28/2021.    Surgical History: Past Surgical History:  Procedure Laterality Date   COLONOSCOPY     COLONOSCOPY WITH PROPOFOL N/A 02/15/2020   Procedure: COLONOSCOPY WITH PROPOFOL;  Surgeon: [name], MD;  Location: [facility];  Service: Endoscopy;  Laterality: N/A;  priority 4   ESOPHAGOGASTRODUODENOSCOPY (EGD) WITH PROPOFOL N/A 09/18/2015   Procedure: ESOPHAGOGASTRODUODENOSCOPY (EGD) WITH PROPOFOL with ballon dilatation;  Surgeon: [name], MD;  Location: [facility];  Service: Endoscopy;  Laterality: N/A;   LAPAROSCOPIC OVARIAN      Medical History: Past Medical History:  Diagnosis Date   GERD (gastroesophageal reflux disease)    Hyperlipidemia    Hypertension    Knee pain    right knee, bone on bone   [place] sickness    [place]   Osteoarthritis of both knees     Shingles 12/14/2014   TMJ (dislocation of temporomandibular joint)     Family History: Family History  Problem Relation Age of Onset   Dementia Mother    Aortic aneurysm Mother    COPD Father    Breast cancer Sister 49   Diabetes Sister    COPD Brother    Cancer Brother    Stroke Sister     Social History   Socioeconomic History   Marital status: Married    Spouse name: Natash Berman   Number of children: 0   Years of education: 13   Highest education level: Not on file  Occupational History   Occupation: Clinical biochemist    Comment: Production manager: [employer]  Tobacco Use   Smoking status: Never   Smokeless tobacco: Never  Vaping Use   Vaping Use: Never used  Substance and Sexual Activity   Alcohol use: No   Drug use: No   Sexual activity: Not on file  Other Topics Concern   Not on file  Social History Narrative   Not on file   Social Determinants of Health   Financial Resource Strain: Not on file  Food Insecurity: Not on file  Transportation Needs: Not on file  Physical Activity: Not on file  Stress: Not on file  Social Connections: Not on file  Intimate Partner Violence: Not on file      Review of Systems  Constitutional:  Positive for chills. Negative for fatigue and fever.  HENT:  Positive for congestion, postnasal drip and sinus pressure. Negative for mouth sores.   Respiratory:  Positive for cough and shortness of breath. Negative for wheezing.   Cardiovascular:  Negative for chest pain.  Gastrointestinal:  Positive for diarrhea and nausea.  Genitourinary:  Negative for flank pain.  Neurological:  Positive for headaches.  Psychiatric/Behavioral: Negative.     Vital Signs: BP 128/77    Pulse 66    Temp 98.3 F (36.8 C)    Observation/Objective:  Pt is able to carry out conversation   Assessment/Plan: 1. Cough with exposure to COVID-19 virus Will start on zpak and prednisone and may continue mucinex and flonase.  Advised to stay well hydrated and rest. A work note will be sent to her excusing work for this past week M-F due to symptoms and covid exposure. - azithromycin (ZITHROMAX) 250 MG tablet; Take one tab a day for 10 days for uri  Dispense: 10 tablet; Refill: 0 - predniSONE (DELTASONE) 10 MG tablet; Take one tab 3 x day for 3 days, then take one tab 2 x a day for 3 days and then take one tab a day for 3 days for copd  Dispense: 18 tablet; Refill: 0   General Counseling: Demetrica verbalizes understanding of the findings of today's phone visit and agrees  with plan of treatment. I have discussed any further diagnostic evaluation that may be needed or ordered today. We also reviewed her medications today. she has been encouraged to call the office with any questions or concerns that should arise related to todays visit.    No orders of the defined types were placed in this encounter.   Meds ordered this encounter  Medications   azithromycin (ZITHROMAX) 250 MG tablet    Sig: Take one tab a day for 10 days for uri    Dispense:  10 tablet    Refill:  0   predniSONE (DELTASONE) 10 MG tablet    Sig: Take one tab 3 x day for 3 days, then take one tab 2 x a day for 3 days and then take one tab a day for 3 days for copd    Dispense:  18 tablet    Refill:  0    Time spent:25 Minutes    Dr Lyndon Code Internal medicine

## 2021-06-02 ENCOUNTER — Telehealth: Payer: Self-pay

## 2021-06-02 NOTE — Telephone Encounter (Signed)
Lmom that pt need to drop  her [facility] bill at front desk

## 2021-06-10 ENCOUNTER — Telehealth: Payer: Self-pay

## 2021-06-10 NOTE — Telephone Encounter (Signed)
Called [facility] and resubmit ICD 10 diagnosis code and also lmom that we called [facility] and resubmit ICD 10 DX code and disregard invoice they will resubmit to insurance and its take about 45 days and they send new invoice

## 2021-06-18 ENCOUNTER — Other Ambulatory Visit: Payer: Self-pay | Admitting: Internal Medicine

## 2021-06-18 DIAGNOSIS — I10 Essential (primary) hypertension: Secondary | ICD-10-CM

## 2021-07-13 ENCOUNTER — Other Ambulatory Visit: Payer: Self-pay

## 2021-07-13 ENCOUNTER — Ambulatory Visit
Admission: RE | Admit: 2021-07-13 | Discharge: 2021-07-13 | Disposition: A | Discharge status: Home / Self Care | Payer: 59 | Source: Ambulatory Visit | Attending: Physician Assistant | Admitting: Physician Assistant

## 2021-07-13 DIAGNOSIS — Z1231 Encounter for screening mammogram for malignant neoplasm of breast: Secondary | ICD-10-CM | POA: Diagnosis not present

## 2021-07-22 ENCOUNTER — Other Ambulatory Visit: Payer: Self-pay | Admitting: Internal Medicine

## 2021-07-22 DIAGNOSIS — I10 Essential (primary) hypertension: Secondary | ICD-10-CM

## 2021-08-16 ENCOUNTER — Encounter: Payer: Self-pay | Admitting: Physician Assistant

## 2021-08-17 ENCOUNTER — Other Ambulatory Visit: Payer: Self-pay | Admitting: Physician Assistant

## 2021-08-17 ENCOUNTER — Telehealth (INDEPENDENT_AMBULATORY_CARE_PROVIDER_SITE_OTHER): Payer: 59 | Admitting: Nurse Practitioner

## 2021-08-17 ENCOUNTER — Encounter: Payer: Self-pay | Admitting: Nurse Practitioner

## 2021-08-17 VITALS — Temp 97.7°F | Resp 16

## 2021-08-17 DIAGNOSIS — R110 Nausea: Secondary | ICD-10-CM | POA: Diagnosis not present

## 2021-08-17 DIAGNOSIS — J018 Other acute sinusitis: Secondary | ICD-10-CM

## 2021-08-17 DIAGNOSIS — K2100 Gastro-esophageal reflux disease with esophagitis, without bleeding: Secondary | ICD-10-CM

## 2021-08-17 MED ORDER — LEVOFLOXACIN 500 MG PO TABS: 500.0000 mg | ORAL_TABLET | Freq: Every day | ORAL | 0 refills | Status: DC

## 2021-08-17 MED ORDER — PROMETHAZINE HCL 12.5 MG PO TABS: ORAL_TABLET | ORAL | 1 refills | Status: DC

## 2021-08-17 NOTE — Telephone Encounter (Signed)
Please refill at app today

## 2021-08-17 NOTE — Progress Notes (Cosign Needed)
[facility] [address] [place], [place] [address]  Internal MEDICINE  Telephone Visit  Patient Name: Mandy Baldwin  [id number]  [id number]  Date of Service: 08/17/2021  I connected with the patient at 10:50 AM by telephone and verified the patients identity using two identifiers.   I discussed the limitations, risks, security and privacy concerns of performing an evaluation and management service by telephone and the availability of in person appointments. I also discussed with the patient that there may be a patient responsible charge related to the service.  The patient expressed understanding and agrees to proceed.    Chief Complaint  Patient presents with   Telephone Screen   Telephone Assessment    [phone], prefers phone call   Vomiting    Symptoms began Saturday morning, no appetite, negative for Covid   Dizziness    Gets dizzy after using the restroom   Diarrhea   Fever    Had a fever yesterday, improved today    HPI Mandy Baldwin presents for a telehealth virtual visit for fever, diarrhea, dizziness, decreased appetite. She was negative for covid.. she is also experiencing sinus pressure and drainage, nasal congestion and cough and chest tightness. She denies any wheezing, headache, or sore throat. Had URI in December and was treated with azithromycin. Previously received levofloxacin in may last year.   Also she weighed 283 lbs today on her scale at home. She has been taking wellbutrin 75 mg to help aid in weight loss. She was 298 lbs in September so she has lost 15 lbs since September last year.   Current Medication: Outpatient Encounter Medications as of 08/17/2021  Medication Sig   aspirin 81 MG tablet Take 81 mg by mouth daily.   atorvastatin (LIPITOR) 20 MG tablet Take 1 tablet (20 mg total) by mouth daily. pm   azithromycin (ZITHROMAX) 250 MG tablet Take one tab a day for 10 days for uri   buPROPion (WELLBUTRIN) 75 MG tablet Take 1 tablet by mouth  once daily   cholecalciferol (VITAMIN D) 1000 UNITS tablet Take 2,000 Units by mouth daily.   cyclobenzaprine (FLEXERIL) 5 MG tablet Take 1 tablet (5 mg total) by mouth 3 (three) times daily as needed for muscle spasms.   fluticasone (FLONASE) 50 MCG/ACT nasal spray Use 2 spray(s) in each nostril once daily   levofloxacin (LEVAQUIN) 500 MG tablet Take 1 tablet (500 mg total) by mouth daily for 7 days.   magnesium oxide (MAG-OX) 400 MG tablet Take 400 mg by mouth daily.   meloxicam (MOBIC) 7.5 MG tablet Take 1 tablet (7.5 mg total) by mouth daily.   metoprolol succinate (TOPROL-XL) 25 MG 24 hr tablet Take 2 tablets by mouth once daily   Multiple Vitamins-Minerals (MULTIVITAL PO) Take 1 tablet by mouth daily.   predniSONE (DELTASONE) 10 MG tablet Take one tab 3 x day for 3 days, then take one tab 2 x a day for 3 days and then take one tab a day for 3 days for copd   triamcinolone (KENALOG) 0.1 % APPLY  CREAM EXTERNALLY TWICE DAILY   triamterene-hydrochlorothiazide (MAXZIDE-25) 37.5-25 MG tablet TAKE 1 TABLET BY MOUTH ONCE DAILY MUST  HAVE  OFFICE  VISIT  PRIOR  TO  FUTURE  REFILLS   [DISCONTINUED] pantoprazole (PROTONIX) 40 MG tablet Take 1 tablet (40 mg total) by mouth 2 (two) times daily.   [DISCONTINUED] promethazine (PHENERGAN) 12.5 MG tablet TAKE 1 TABLET BY MOUTH EVERY 8 HOURS AS NEEDED FOR NAUSEA AND  VOMITING   promethazine (PHENERGAN) 12.5 MG tablet TAKE 1 TABLET BY MOUTH EVERY 8 HOURS AS NEEDED FOR NAUSEA AND VOMITING   No facility-administered encounter medications on file as of 08/17/2021.    Surgical History: Past Surgical History:  Procedure Laterality Date   COLONOSCOPY     COLONOSCOPY WITH PROPOFOL N/A 02/15/2020   Procedure: COLONOSCOPY WITH PROPOFOL;  Surgeon: Midge MiniumWohl, Darren, MD;  [facility];  Service: Endoscopy;  Laterality: N/A;  priority 4   ESOPHAGOGASTRODUODENOSCOPY (EGD) WITH PROPOFOL N/A 09/18/2015   Procedure: ESOPHAGOGASTRODUODENOSCOPY (EGD) WITH  PROPOFOL with ballon dilatation;  Surgeon: Midge Miniumarren Wohl, MD;  [facility];  Service: Endoscopy;  Laterality: N/A;   LAPAROSCOPIC OVARIAN      Medical History: Past Medical History:  Diagnosis Date   GERD (gastroesophageal reflux disease)    Hyperlipidemia    Hypertension    Knee pain    right knee, bone on bone   Motion sickness    [place]   Osteoarthritis of both knees    Shingles 12/14/2014   TMJ (dislocation of temporomandibular joint)     Family History: Family History  Problem Relation Age of Onset   Dementia Mother    Aortic aneurysm Mother    COPD Father    Breast cancer Sister [phone]   Diabetes Sister    COPD Brother    Cancer Brother    Stroke Sister     Social History   Socioeconomic History   Marital status: Married    Spouse name: Mandy Baldwin   Number of children: 0   Years of education: 13   Highest education level: Not on file  Occupational History   Occupation: Clinical biochemistCustomer Service    Comment: [employer]    Employer: [employer]  Tobacco Use   Smoking status: Never   Smokeless tobacco: Never  Vaping Use   Vaping Use: Never used  Substance and Sexual Activity   Alcohol use: No   Drug use: No   Sexual activity: Not on file  Other Topics Concern   Not on file  Social History Narrative   Not on file   Social Determinants of Health   Financial Resource Strain: Not on file  Food Insecurity: Not on file  Transportation Needs: Not on file  Physical Activity: Not on file  Stress: Not on file  Social Connections: Not on file  Intimate Partner Violence: Not on file      Review of Systems  Constitutional:  Positive for appetite change, chills, fatigue and fever.  HENT:  Positive for congestion, postnasal drip, rhinorrhea, sinus pressure and sinus pain. Negative for ear pain, sneezing, sore throat and trouble swallowing.   Eyes:  Negative for pain.  Respiratory:  Positive for cough, chest tightness and shortness of  breath. Negative for wheezing.   Cardiovascular: Negative.  Negative for chest pain and palpitations.  Gastrointestinal:  Positive for abdominal pain, diarrhea, nausea and vomiting. Negative for constipation.  Musculoskeletal:  Positive for myalgias.  Skin:  Negative for rash.  Neurological:  Positive for dizziness. Negative for numbness and headaches.   Vital Signs: Temp 97.7 F (36.5 C)    Resp 16    Observation/Objective: She is alert and oriented and engages in conversation appropriately. She does not sound as though she is in any acute distress over telephone call.     Assessment/Plan: 1. Acute non-recurrent sinusitis of other sinus Empiric antibiotic treatment prescribed.  - levofloxacin (LEVAQUIN) 500 MG tablet; Take 1  tablet (500 mg total) by mouth daily for 7 days.  Dispense: 7 tablet; Refill: 0  2. Nausea Refills ordered.  - promethazine (PHENERGAN) 12.5 MG tablet; TAKE 1 TABLET BY MOUTH EVERY 8 HOURS AS NEEDED FOR NAUSEA AND VOMITING  Dispense: 20 tablet; Refill: 1   General Counseling: Mekia verbalizes understanding of the findings of today's phone visit and agrees with plan of treatment. I have discussed any further diagnostic evaluation that may be needed or ordered today. We also reviewed her medications today. she has been encouraged to call the office with any questions or concerns that should arise related to todays visit.  Return if symptoms worsen or fail to improve.   No orders of the defined types were placed in this encounter.   Meds ordered this encounter  Medications   promethazine (PHENERGAN) 12.5 MG tablet    Sig: TAKE 1 TABLET BY MOUTH EVERY 8 HOURS AS NEEDED FOR NAUSEA AND VOMITING    Dispense:  20 tablet    Refill:  1   levofloxacin (LEVAQUIN) 500 MG tablet    Sig: Take 1 tablet (500 mg total) by mouth daily for 7 days.    Dispense:  7 tablet    Refill:  0    Time spent:10 Minutes Time spent with patient included reviewing progress notes,  labs, imaging studies, and discussing plan for follow up.  [place] Controlled Substance Database was reviewed by me for overdose risk score (ORS) if appropriate.  This patient was seen by Sallyanne Kuster, FNP-C in collaboration with Dr. Beverely Risen as a part of collaborative care agreement.  Breindy Meadow R. Tedd Sias, MSN, FNP-C Internal medicine

## 2021-08-18 ENCOUNTER — Telehealth: Payer: Self-pay

## 2021-08-18 ENCOUNTER — Encounter: Payer: Self-pay | Admitting: Nurse Practitioner

## 2021-08-18 NOTE — Telephone Encounter (Signed)
Work note emailed to patient. Sent to be scanned-Toni 

## 2021-08-20 ENCOUNTER — Encounter: Payer: Self-pay | Admitting: Nurse Practitioner

## 2021-08-20 ENCOUNTER — Telehealth: Payer: Self-pay

## 2021-08-20 NOTE — Telephone Encounter (Signed)
Went over med list with pt and d/c'd what pt wasn't taking ?

## 2021-08-21 ENCOUNTER — Encounter: Payer: Self-pay | Admitting: Nurse Practitioner

## 2021-08-21 MED ORDER — TRIAMCINOLONE ACETONIDE 0.1 % EX CREA: 1.0000 "application " | TOPICAL_CREAM | Freq: Two times a day (BID) | CUTANEOUS | 2 refills | Status: DC | PRN

## 2021-08-21 MED ORDER — AMOXICILLIN-POT CLAVULANATE 875-125 MG PO TABS: 1.0000 | ORAL_TABLET | Freq: Two times a day (BID) | ORAL | 0 refills | Status: AC

## 2021-08-21 NOTE — Telephone Encounter (Signed)
LMOM to confirm pt has received message about meds ?

## 2021-08-21 NOTE — Telephone Encounter (Signed)
LMOM for pt and informed her of the change of the antibiotic and also we sent in a cream.  We did advised pt to stop the Levaquin until Alyssa can see what else pt can take.  Also advised that pt can use OTC Benadryl, diphenhydramine or cetirizine for itching according to instructions on packaging.  Informed pt if any questions to call us back ?

## 2021-10-14 ENCOUNTER — Other Ambulatory Visit: Payer: Self-pay | Admitting: Physician Assistant

## 2021-10-14 DIAGNOSIS — K2100 Gastro-esophageal reflux disease with esophagitis, without bleeding: Secondary | ICD-10-CM

## 2021-10-18 ENCOUNTER — Other Ambulatory Visit: Payer: Self-pay | Admitting: Physician Assistant

## 2021-10-18 DIAGNOSIS — M170 Bilateral primary osteoarthritis of knee: Secondary | ICD-10-CM

## 2021-10-30 ENCOUNTER — Other Ambulatory Visit: Payer: Self-pay | Admitting: Physician Assistant

## 2021-10-30 DIAGNOSIS — I10 Essential (primary) hypertension: Secondary | ICD-10-CM

## 2021-11-18 ENCOUNTER — Other Ambulatory Visit: Payer: Self-pay | Admitting: Physician Assistant

## 2021-12-22 ENCOUNTER — Other Ambulatory Visit: Payer: Self-pay | Admitting: Physician Assistant

## 2021-12-22 DIAGNOSIS — I10 Essential (primary) hypertension: Secondary | ICD-10-CM

## 2021-12-29 ENCOUNTER — Encounter: Payer: Self-pay | Admitting: Physician Assistant

## 2022-01-01 ENCOUNTER — Encounter: Payer: 59 | Admitting: Physician Assistant

## 2022-01-04 ENCOUNTER — Encounter: Payer: 59 | Admitting: Physician Assistant

## 2022-01-21 ENCOUNTER — Encounter: Payer: 59 | Admitting: Physician Assistant

## 2022-01-22 ENCOUNTER — Other Ambulatory Visit: Payer: Self-pay | Admitting: Physician Assistant

## 2022-01-22 DIAGNOSIS — I10 Essential (primary) hypertension: Secondary | ICD-10-CM

## 2022-02-11 ENCOUNTER — Encounter: Payer: 59 | Admitting: Physician Assistant

## 2022-02-20 ENCOUNTER — Other Ambulatory Visit: Payer: Self-pay | Admitting: Physician Assistant

## 2022-02-25 ENCOUNTER — Ambulatory Visit (INDEPENDENT_AMBULATORY_CARE_PROVIDER_SITE_OTHER): Payer: 59 | Admitting: Physician Assistant

## 2022-02-25 VITALS — BP 140/80 | HR 70 | Temp 97.8°F | Resp 16 | Ht 63.0 in | Wt 295.0 lb

## 2022-02-25 DIAGNOSIS — R946 Abnormal results of thyroid function studies: Secondary | ICD-10-CM

## 2022-02-25 DIAGNOSIS — I10 Essential (primary) hypertension: Secondary | ICD-10-CM | POA: Diagnosis not present

## 2022-02-25 DIAGNOSIS — E782 Mixed hyperlipidemia: Secondary | ICD-10-CM

## 2022-02-25 DIAGNOSIS — Z0001 Encounter for general adult medical examination with abnormal findings: Secondary | ICD-10-CM

## 2022-02-25 DIAGNOSIS — T63421A Toxic effect of venom of ants, accidental (unintentional), initial encounter: Secondary | ICD-10-CM

## 2022-02-25 DIAGNOSIS — R5383 Other fatigue: Secondary | ICD-10-CM | POA: Diagnosis not present

## 2022-02-25 DIAGNOSIS — E559 Vitamin D deficiency, unspecified: Secondary | ICD-10-CM

## 2022-02-25 DIAGNOSIS — M170 Bilateral primary osteoarthritis of knee: Secondary | ICD-10-CM | POA: Diagnosis not present

## 2022-02-25 DIAGNOSIS — R300 Dysuria: Secondary | ICD-10-CM

## 2022-02-25 NOTE — Progress Notes (Signed)
[facility] [address] [place], [place] [address]  Internal MEDICINE  Office Visit Note  Patient Name: Mandy Baldwin  [id number]  [id number]  Date of Service: 03/02/2022  Chief Complaint  Patient presents with   Annual Exam   Hyperlipidemia   Hypertension   Insect Bite    Ant bite in both feet      HPI Pt is here for routine health maintenance examination and is doing well today -Has been a rough year for her -got cortisone injections in both knees last month -using kenalog cream for ant bites on feet and is helping. Advised to watch for any worsening   -BP stable at home -Mammogram done in Feb, colonoscopy done in 2021, pap due next year -Due for routine fasting labs  Current Medication: Outpatient Encounter Medications as of 02/25/2022  Medication Sig   aspirin 81 MG tablet Take 81 mg by mouth daily.   buPROPion (WELLBUTRIN) 75 MG tablet Take 1 tablet by mouth once daily   cholecalciferol (VITAMIN D) 1000 UNITS tablet Take 2,000 Units by mouth daily.   cyclobenzaprine (FLEXERIL) 5 MG tablet Take 1 tablet (5 mg total) by mouth 3 (three) times daily as needed for muscle spasms.   fluticasone (FLONASE) 50 MCG/ACT nasal spray Use 2 spray(s) in each nostril once daily   magnesium oxide (MAG-OX) 400 MG tablet Take 400 mg by mouth daily.   meloxicam (MOBIC) 7.5 MG tablet Take 1 tablet by mouth once daily   metoprolol succinate (TOPROL-XL) 25 MG 24 hr tablet Take 2 tablets by mouth once daily   Multiple Vitamins-Minerals (MULTIVITAL PO) Take 1 tablet by mouth daily.   pantoprazole (PROTONIX) 40 MG tablet Take 1 tablet by mouth twice daily   promethazine (PHENERGAN) 12.5 MG tablet TAKE 1 TABLET BY MOUTH EVERY 8 HOURS AS NEEDED FOR NAUSEA AND VOMITING   triamcinolone cream (KENALOG) 0.1 % Apply 1 application. topically 2 (two) times daily as needed (itching and rash).   triamterene-hydrochlorothiazide (MAXZIDE-25) 37.5-25 MG tablet TAKE 1 TABLET BY MOUTH ONCE  DAILY . APPOINTMENT REQUIRED FOR FUTURE REFILLS   atorvastatin (LIPITOR) 20 MG tablet Take 1 tablet (20 mg total) by mouth daily. pm   No facility-administered encounter medications on file as of 02/25/2022.    Surgical History: Past Surgical History:  Procedure Laterality Date   COLONOSCOPY     COLONOSCOPY WITH PROPOFOL N/A 02/15/2020   Procedure: COLONOSCOPY WITH PROPOFOL;  Surgeon: [name], MD;  [facility];  Service: Endoscopy;  Laterality: N/A;  priority 4   ESOPHAGOGASTRODUODENOSCOPY (EGD) WITH PROPOFOL N/A 09/18/2015   Procedure: ESOPHAGOGASTRODUODENOSCOPY (EGD) WITH PROPOFOL with ballon dilatation;  Surgeon: [name], MD;  Location: [facility];  Service: Endoscopy;  Laterality: N/A;   LAPAROSCOPIC OVARIAN      Medical History: Past Medical History:  Diagnosis Date   GERD (gastroesophageal reflux disease)    Hyperlipidemia    Hypertension    Knee pain    right knee, bone on bone   [place] sickness    [place]   Osteoarthritis of both knees    Shingles 12/14/2014   TMJ (dislocation of temporomandibular joint)     Family History: Family History  Problem Relation Age of Onset   Dementia Mother    Aortic aneurysm Mother    COPD Father    Breast cancer Sister 49   Diabetes Sister    COPD Brother    Cancer Brother    Stroke Sister  Review of Systems  Constitutional:  Negative for chills, fatigue and unexpected weight change.  HENT:  Negative for congestion, postnasal drip, rhinorrhea, sneezing and sore throat.   Eyes:  Negative for redness.  Respiratory:  Negative for cough, chest tightness and shortness of breath.   Cardiovascular:  Negative for chest pain and palpitations.  Gastrointestinal:  Negative for abdominal pain, constipation, diarrhea, nausea and vomiting.  Genitourinary:  Negative for dysuria and frequency.  Musculoskeletal:  Positive for arthralgias. Negative for back pain, joint swelling and neck pain.   Skin:  Positive for rash.  Neurological: Negative.  Negative for tremors and numbness.  Hematological:  Negative for adenopathy. Does not bruise/bleed easily.  Psychiatric/Behavioral:  Negative for behavioral problems (Depression), sleep disturbance and suicidal ideas. The patient is not nervous/anxious.      Vital Signs: BP (!) 140/80   Pulse 70   Temp 97.8 F (36.6 C)   Resp 16   Ht 5\' 3"  (1.6 m)   Wt 295 lb (133.8 kg)   SpO2 97%   BMI 52.26 kg/m    Physical Exam Vitals and nursing note reviewed.  Constitutional:      General: She is not in acute distress.    Appearance: She is well-developed. She is obese. She is not diaphoretic.  HENT:     Head: Normocephalic and atraumatic.     Mouth/Throat:     Pharynx: No oropharyngeal exudate.  Eyes:     Pupils: Pupils are equal, round, and reactive to light.  Neck:     Thyroid: No thyromegaly.     Vascular: No JVD.     Trachea: No tracheal deviation.  Cardiovascular:     Rate and Rhythm: Normal rate and regular rhythm.     Heart sounds: Normal heart sounds. No murmur heard.    No friction rub. No gallop.  Pulmonary:     Effort: Pulmonary effort is normal. No respiratory distress.     Breath sounds: No wheezing or rales.  Chest:     Chest wall: No tenderness.  Breasts:    Right: Normal. No mass.     Left: Normal. No mass.  Abdominal:     General: Bowel sounds are normal.     Palpations: Abdomen is soft.     Tenderness: There is no abdominal tenderness.  Musculoskeletal:        General: Normal range of motion.     Cervical back: Normal range of motion and neck supple.  Lymphadenopathy:     Cervical: No cervical adenopathy.  Skin:    General: Skin is warm and dry.     Findings: Rash present.     Comments: 2-3 ant bites on each foot, very small area of erythema at each site, no open wound or drainage  Neurological:     Mental Status: She is alert and oriented to person, place, and time.     Cranial Nerves: No  cranial nerve deficit.  Psychiatric:        Behavior: Behavior normal.        Thought Content: Thought content normal.        Judgment: Judgment normal.      LABS: Recent Results (from the past 2160 hour(s))  UA/M w/rflx Culture, Routine     Status: Abnormal   Collection Time: 02/25/22 11:20 AM   Specimen: Urine   Urine  Result Value Ref Range   Specific Gravity, UA 1.022 1.005 - 1.030   pH, UA 6.0 5.0 - 7.5  Color, UA Yellow Yellow   Appearance Ur Cloudy (A) Clear   Leukocytes,UA 1+ (A) Negative   Protein,UA Negative Negative/Trace   Glucose, UA Negative Negative   Ketones, UA Negative Negative   RBC, UA Negative Negative   Bilirubin, UA Negative Negative   Urobilinogen, Ur 0.2 0.2 - 1.0 mg/dL   Nitrite, UA Negative Negative   Microscopic Examination See below:     Comment: Microscopic was indicated and was performed.   Urinalysis Reflex Comment     Comment: This specimen has reflexed to a Urine Culture.  Microscopic Examination     Status: Abnormal   Collection Time: 02/25/22 11:20 AM   Urine  Result Value Ref Range   WBC, UA 6-10 (A) 0 - 5 /hpf   RBC, Urine 0-2 0 - 2 /hpf   Epithelial Cells (non renal) 0-10 0 - 10 /hpf   Casts None seen None seen /lpf   Bacteria, UA None seen None seen/Few  Urine Culture, Reflex     Status: None   Collection Time: 02/25/22 11:20 AM   Urine  Result Value Ref Range   Urine Culture, Routine Final report    Organism ID, Bacteria Comment     Comment: Mixed urogenital flora 10,000-25,000 colony forming units per mL         Assessment/Plan: 1. Encounter for general adult medical examination with abnormal findings CPE performed, routine fasting labs ordered, UTD on PHM - CBC w/Diff/Platelet - Comprehensive metabolic panel - Lipid Panel With LDL/HDL Ratio - TSH + free T4 - VITAMIN D 25 Hydroxy (Vit-D Deficiency, Fractures)  2. Essential hypertension Borderline in office, continue current medications  3. Fire ant bite,  accidental or unintentional, initial encounter May continue using kenalog cream and advised to call office if worsening or signs of infection  4. Arthritis of both knees Followed by ortho  5. Mixed hyperlipidemia Continue lipitor and will update labs - Lipid Panel With LDL/HDL Ratio  6. Other fatigue - CBC w/Diff/Platelet - Comprehensive metabolic panel - Lipid Panel With LDL/HDL Ratio - TSH + free T4 - VITAMIN D 25 Hydroxy (Vit-D Deficiency, Fractures)  7. Vitamin D deficiency - VITAMIN D 25 Hydroxy (Vit-D Deficiency, Fractures)  8. Abnormal thyroid exam - TSH + free T4  9. Dysuria - UA/M w/rflx Culture, Routine - Microscopic Examination - Urine Culture, Reflex   General Counseling: Sherrel verbalizes understanding of the findings of todays visit and agrees with plan of treatment. I have discussed any further diagnostic evaluation that may be needed or ordered today. We also reviewed her medications today. she has been encouraged to call the office with any questions or concerns that should arise related to todays visit.    Counseling:    Orders Placed This Encounter  Procedures   Microscopic Examination   Urine Culture, Reflex   UA/M w/rflx Culture, Routine   CBC w/Diff/Platelet   Comprehensive metabolic panel   Lipid Panel With LDL/HDL Ratio   TSH + free T4   VITAMIN D 25 Hydroxy (Vit-D Deficiency, Fractures)    No orders of the defined types were placed in this encounter.   This patient was seen by Drema Dallas, PA-C in collaboration with Dr. Clayborn Bigness as a part of collaborative care agreement.  Total time spent:35 Minutes  Time spent includes review of chart, medications, test results, and follow up plan with the patient.     Lavera Guise, MD  Internal Medicine

## 2022-02-26 ENCOUNTER — Encounter: Payer: Self-pay | Admitting: Physician Assistant

## 2022-03-01 ENCOUNTER — Telehealth: Payer: Self-pay

## 2022-03-01 LAB — UA/M W/RFLX CULTURE, ROUTINE
Bilirubin, UA: NEGATIVE
Glucose, UA: NEGATIVE
Ketones, UA: NEGATIVE
Nitrite, UA: NEGATIVE
Protein,UA: NEGATIVE
RBC, UA: NEGATIVE
Specific Gravity, UA: 1.022 (ref 1.005–1.030)
Urobilinogen, Ur: 0.2 mg/dL (ref 0.2–1.0)
pH, UA: 6 (ref 5.0–7.5)

## 2022-03-01 LAB — MICROSCOPIC EXAMINATION
Bacteria, UA: NONE SEEN
Casts: NONE SEEN /lpf

## 2022-03-01 LAB — URINE CULTURE, REFLEX

## 2022-03-01 NOTE — Telephone Encounter (Signed)
-----   Message from Mylinda Latina, PA-C sent at 03/01/2022 11:24 AM EDT ----- Please let her know that her urine came back with normal flora which doesn't require treatment

## 2022-03-01 NOTE — Telephone Encounter (Signed)
Spoke with patient regarding urine culture results. 

## 2022-03-12 ENCOUNTER — Telehealth: Payer: Self-pay

## 2022-03-12 LAB — CBC WITH DIFFERENTIAL/PLATELET
Basophils Absolute: 0 10*3/uL (ref 0.0–0.2)
Basos: 0 %
EOS (ABSOLUTE): 0.1 10*3/uL (ref 0.0–0.4)
Eos: 1 %
Hematocrit: 46 % (ref 34.0–46.6)
Hemoglobin: 15.8 g/dL (ref 11.1–15.9)
Immature Grans (Abs): 0 10*3/uL (ref 0.0–0.1)
Immature Granulocytes: 0 %
Lymphocytes Absolute: 2.3 10*3/uL (ref 0.7–3.1)
Lymphs: 22 %
MCH: 31.2 pg (ref 26.6–33.0)
MCHC: 34.3 g/dL (ref 31.5–35.7)
MCV: 91 fL (ref 79–97)
Monocytes Absolute: 0.6 10*3/uL (ref 0.1–0.9)
Monocytes: 6 %
Neutrophils Absolute: 7.1 10*3/uL — ABNORMAL HIGH (ref 1.4–7.0)
Neutrophils: 71 %
Platelets: 266 10*3/uL (ref 150–450)
RBC: 5.06 x10E6/uL (ref 3.77–5.28)
RDW: 12.5 % (ref 11.7–15.4)
WBC: 10.2 10*3/uL (ref 3.4–10.8)

## 2022-03-12 LAB — COMPREHENSIVE METABOLIC PANEL
ALT: 21 IU/L (ref 0–32)
AST: 19 IU/L (ref 0–40)
Albumin/Globulin Ratio: 1.8 (ref 1.2–2.2)
Albumin: 4.4 g/dL (ref 3.8–4.9)
Alkaline Phosphatase: 93 IU/L (ref 44–121)
BUN/Creatinine Ratio: 22 (ref 9–23)
BUN: 14 mg/dL (ref 6–24)
Bilirubin Total: 0.4 mg/dL (ref 0.0–1.2)
CO2: 27 mmol/L (ref 20–29)
Calcium: 9.6 mg/dL (ref 8.7–10.2)
Chloride: 100 mmol/L (ref 96–106)
Creatinine, Ser: 0.65 mg/dL (ref 0.57–1.00)
Globulin, Total: 2.4 g/dL (ref 1.5–4.5)
Glucose: 99 mg/dL (ref 70–99)
Potassium: 5.2 mmol/L (ref 3.5–5.2)
Sodium: 143 mmol/L (ref 134–144)
Total Protein: 6.8 g/dL (ref 6.0–8.5)
eGFR: 101 mL/min/{1.73_m2} (ref >59)

## 2022-03-12 LAB — TSH+FREE T4
Free T4: 1.33 ng/dL (ref 0.82–1.77)
TSH: 0.774 u[IU]/mL (ref 0.450–4.500)

## 2022-03-12 LAB — LIPID PANEL WITH LDL/HDL RATIO
Cholesterol, Total: 183 mg/dL (ref 100–199)
HDL: 61 mg/dL (ref >39)
LDL Chol Calc (NIH): 102 mg/dL — ABNORMAL HIGH (ref 0–99)
LDL/HDL Ratio: 1.7 ratio (ref 0.0–3.2)
Triglycerides: 114 mg/dL (ref 0–149)
VLDL Cholesterol Cal: 20 mg/dL (ref 5–40)

## 2022-03-12 LAB — VITAMIN D 25 HYDROXY (VIT D DEFICIENCY, FRACTURES): Vit D, 25-Hydroxy: 50.6 ng/mL (ref 30.0–100.0)

## 2022-03-15 NOTE — Telephone Encounter (Signed)
Pt notified for labs result  

## 2022-03-15 NOTE — Telephone Encounter (Signed)
-----   Message from Mylinda Latina, PA-C sent at 03/15/2022 12:52 PM EDT ----- Please let her know that overall her labs looked good. Her LDL cholesterol was just borderline elevated and to continue to work on diet and exercise.

## 2022-03-31 ENCOUNTER — Other Ambulatory Visit: Payer: Self-pay | Admitting: Physician Assistant

## 2022-03-31 DIAGNOSIS — K2100 Gastro-esophageal reflux disease with esophagitis, without bleeding: Secondary | ICD-10-CM

## 2022-04-02 ENCOUNTER — Other Ambulatory Visit: Payer: Self-pay | Admitting: Nurse Practitioner

## 2022-04-02 DIAGNOSIS — R110 Nausea: Secondary | ICD-10-CM

## 2022-04-12 ENCOUNTER — Other Ambulatory Visit: Payer: Self-pay | Admitting: Physician Assistant

## 2022-04-12 DIAGNOSIS — M170 Bilateral primary osteoarthritis of knee: Secondary | ICD-10-CM

## 2022-04-22 ENCOUNTER — Other Ambulatory Visit: Payer: Self-pay | Admitting: Physician Assistant

## 2022-04-27 ENCOUNTER — Other Ambulatory Visit: Payer: Self-pay | Admitting: Physician Assistant

## 2022-04-27 DIAGNOSIS — K2100 Gastro-esophageal reflux disease with esophagitis, without bleeding: Secondary | ICD-10-CM

## 2022-05-13 ENCOUNTER — Other Ambulatory Visit: Payer: Self-pay | Admitting: Physician Assistant

## 2022-05-13 DIAGNOSIS — K2100 Gastro-esophageal reflux disease with esophagitis, without bleeding: Secondary | ICD-10-CM

## 2022-05-17 ENCOUNTER — Other Ambulatory Visit: Payer: Self-pay | Admitting: Physician Assistant

## 2022-05-17 DIAGNOSIS — I10 Essential (primary) hypertension: Secondary | ICD-10-CM

## 2022-05-27 ENCOUNTER — Other Ambulatory Visit: Payer: Self-pay | Admitting: Physician Assistant

## 2022-06-06 ENCOUNTER — Other Ambulatory Visit: Payer: Self-pay | Admitting: Physician Assistant

## 2022-06-06 DIAGNOSIS — I10 Essential (primary) hypertension: Secondary | ICD-10-CM

## 2022-06-23 ENCOUNTER — Other Ambulatory Visit: Payer: Self-pay | Admitting: Physician Assistant

## 2022-06-23 DIAGNOSIS — K2100 Gastro-esophageal reflux disease with esophagitis, without bleeding: Secondary | ICD-10-CM

## 2022-07-01 ENCOUNTER — Other Ambulatory Visit: Payer: Self-pay | Admitting: Physician Assistant

## 2022-07-01 ENCOUNTER — Ambulatory Visit
Admission: RE | Admit: 2022-07-01 | Discharge: 2022-07-01 | Disposition: A | Discharge status: Home / Self Care | Payer: 59 | Source: Ambulatory Visit | Attending: Physician Assistant | Admitting: Physician Assistant

## 2022-07-01 ENCOUNTER — Encounter: Payer: Self-pay | Admitting: Physician Assistant

## 2022-07-01 ENCOUNTER — Ambulatory Visit
Admission: RE | Admit: 2022-07-01 | Discharge: 2022-07-01 | Disposition: A | Discharge status: Home / Self Care | Payer: 59 | Attending: Physician Assistant | Admitting: Physician Assistant

## 2022-07-01 ENCOUNTER — Ambulatory Visit (INDEPENDENT_AMBULATORY_CARE_PROVIDER_SITE_OTHER): Payer: 59 | Admitting: Physician Assistant

## 2022-07-01 VITALS — BP 132/70 | HR 72 | Temp 98.5°F | Resp 16 | Ht 63.0 in | Wt 299.4 lb

## 2022-07-01 DIAGNOSIS — M170 Bilateral primary osteoarthritis of knee: Secondary | ICD-10-CM

## 2022-07-01 DIAGNOSIS — M79672 Pain in left foot: Secondary | ICD-10-CM

## 2022-07-01 DIAGNOSIS — I10 Essential (primary) hypertension: Secondary | ICD-10-CM | POA: Diagnosis not present

## 2022-07-01 NOTE — Progress Notes (Signed)
[facility] [place], [place] [address]  Internal MEDICINE  Office Visit Note  Patient Name: Mandy Baldwin  [id number]  [id number]  Date of Service: 07/02/2022  Chief Complaint  Patient presents with   Follow-up   Gastroesophageal Reflux   Hypertension   Hyperlipidemia    HPI Pt is here for routine follow up -for the past few weeks her left foot has been bothersome, worse the past few days. Has put topical on and that does help some when she is laying down, but still hurts once she starts walking. -Baldwin injury that she is aware of. Some swelling along top/outside of foot. Will start by ordering xrays and advised to rest and ice it. -some coughing the past few days, some sinus pressure. Has used some flonase and will start mucinex. May call if progressing   Current Medication: Outpatient Encounter Medications as of 07/01/2022  Medication Sig   aspirin 81 MG tablet Take 81 mg by mouth daily.   cholecalciferol (VITAMIN D) 1000 UNITS tablet Take 2,000 Units by mouth daily.   cyclobenzaprine (FLEXERIL) 5 MG tablet Take 1 tablet (5 mg total) by mouth 3 (three) times daily as needed for muscle spasms.   fluticasone (FLONASE) 50 MCG/ACT nasal spray Use 2 spray(s) in each nostril once daily   magnesium oxide (MAG-OX) 400 MG tablet Take 400 mg by mouth daily.   metoprolol succinate (TOPROL-XL) 25 MG 24 hr tablet Take 2 tablets by mouth once daily   Multiple Vitamins-Minerals (MULTIVITAL PO) Take 1 tablet by mouth daily.   pantoprazole (PROTONIX) 40 MG tablet Take 1 tablet by mouth twice daily   promethazine (PHENERGAN) 12.5 MG tablet TAKE 1 TABLET BY MOUTH EVERY 8 HOURS AS NEEDED FOR NAUSEA AND VOMITING   triamcinolone cream (KENALOG) 0.1 % Apply 1 application. topically 2 (two) times daily as needed (itching and rash).   triamterene-hydrochlorothiazide (MAXZIDE-25) 37.5-25 MG tablet TAKE 1 TABLET BY MOUTH ONCE DAILY . APPOINTMENT REQUIRED FOR FUTURE REFILLS    [DISCONTINUED] buPROPion (WELLBUTRIN) 75 MG tablet TAKE 1 TABLET BY MOUTH ONCE DAILY (APPOINTMENT  REQUIRED  FOR  FUTURE  REFILLS)   [DISCONTINUED] meloxicam (MOBIC) 7.5 MG tablet Take 1 tablet by mouth once daily   atorvastatin (LIPITOR) 20 MG tablet Take 1 tablet (20 mg total) by mouth daily. pm   Baldwin facility-administered encounter medications on file as of 07/01/2022.    Surgical History: Past Surgical History:  Procedure Laterality Date   COLONOSCOPY     COLONOSCOPY WITH PROPOFOL N/A 02/15/2020   Procedure: COLONOSCOPY WITH PROPOFOL;  Surgeon: Lucilla Lame, MD;  Location: [facility];  Service: Endoscopy;  Laterality: N/A;  priority 4   ESOPHAGOGASTRODUODENOSCOPY (EGD) WITH PROPOFOL N/A 09/18/2015   Procedure: ESOPHAGOGASTRODUODENOSCOPY (EGD) WITH PROPOFOL with ballon dilatation;  Surgeon: Lucilla Lame, MD;  Location: [facility];  Service: Endoscopy;  Laterality: N/A;   LAPAROSCOPIC OVARIAN      Medical History: Past Medical History:  Diagnosis Date   GERD (gastroesophageal reflux disease)    Hyperlipidemia    Hypertension    Knee pain    right knee, bone on bone   Motion sickness    [place]   Osteoarthritis of both knees    Shingles 12/14/2014   TMJ (dislocation of temporomandibular joint)     Family History: Family History  Problem Relation Age of Onset   Dementia Mother    Aortic aneurysm Mother    COPD Father    Breast cancer  Sister 11   Diabetes Sister    COPD Brother    Cancer Brother    Stroke Sister     Social History   Socioeconomic History   Marital status: Married    Spouse name: Jakyrah Holladay   Number of children: 0   Years of education: 13   Highest education level: Not on file  Occupational History   Occupation: Clinical biochemist    Comment: Production manager: [employer]  Tobacco Use   Smoking status: Never   Smokeless tobacco: Never  Vaping Use   Vaping Use: Never used  Substance and Sexual Activity    Alcohol use: Baldwin   Drug use: Baldwin   Sexual activity: Not on file  Other Topics Concern   Not on file  Social History Narrative   Not on file   Social Determinants of Health   Financial Resource Strain: Not on file  Food Insecurity: Not on file  Transportation Needs: Not on file  Physical Activity: Unknown (04/14/2017)   Exercise Vital Sign    Days of Exercise per Week: 0 days    Minutes of Exercise per Session: Not on file  Stress: Not on file  Social Connections: Not on file  Intimate Partner Violence: Not on file      Review of Systems  Constitutional:  Negative for chills, fatigue and unexpected weight change.  HENT:  Negative for congestion, postnasal drip, rhinorrhea, sneezing and sore throat.   Eyes:  Negative for redness.  Respiratory:  Negative for cough, chest tightness and shortness of breath.   Cardiovascular:  Negative for chest pain and palpitations.  Gastrointestinal:  Negative for abdominal pain, constipation, diarrhea, nausea and vomiting.  Genitourinary:  Negative for dysuria and frequency.  Musculoskeletal:  Positive for arthralgias, gait problem and joint swelling. Negative for back pain and neck pain.  Skin:  Negative for rash.  Neurological:  Negative for tremors and numbness.  Hematological:  Negative for adenopathy. Does not bruise/bleed easily.  Psychiatric/Behavioral:  Negative for behavioral problems (Depression), sleep disturbance and suicidal ideas. The patient is not nervous/anxious.     Vital Signs: BP 132/70 Comment: 148/93  Pulse 72   Temp 98.5 F (36.9 C)   Resp 16   Ht 5\' 3"  (1.6 m)   Wt 299 lb 6.4 oz (135.8 kg)   SpO2 95%   BMI 53.04 kg/m    Physical Exam Vitals and nursing note reviewed.  Constitutional:      General: She is not in acute distress.    Appearance: She is well-developed. She is obese. She is not diaphoretic.  HENT:     Head: Normocephalic and atraumatic.     Mouth/Throat:     Pharynx: Baldwin oropharyngeal exudate.   Eyes:     Pupils: Pupils are equal, round, and reactive to light.  Neck:     Thyroid: Baldwin thyromegaly.     Vascular: Baldwin JVD.     Trachea: Baldwin tracheal deviation.  Cardiovascular:     Rate and Rhythm: Normal rate and regular rhythm.     Heart sounds: Normal heart sounds. Baldwin murmur heard.    Baldwin friction rub. Baldwin gallop.  Pulmonary:     Effort: Pulmonary effort is normal. Baldwin respiratory distress.     Breath sounds: Baldwin wheezing or rales.  Chest:     Chest wall: Baldwin tenderness.  Breasts:    Right: Normal. Baldwin mass.     Left: Normal. Baldwin mass.  Abdominal:  General: Bowel sounds are normal.     Palpations: Abdomen is soft.  Musculoskeletal:        General: Swelling and tenderness present. Normal range of motion.     Cervical back: Normal range of motion and neck supple.     Comments: area of swelling along top lateral side of left foot, tender to palpation and painful with weight bearing. Reports some numbness along this area as well, Baldwin pain/swelling/sensation changes distal or proximal to the area and able to move all toes. Pulses intact and full ROM of ankle, though discomfort with rotation of foot  Lymphadenopathy:     Cervical: Baldwin cervical adenopathy.  Skin:    General: Skin is warm and dry.  Neurological:     Mental Status: She is alert and oriented to person, place, and time.     Cranial Nerves: Baldwin cranial nerve deficit.  Psychiatric:        Behavior: Behavior normal.        Thought Content: Thought content normal.        Judgment: Judgment normal.        Assessment/Plan: 1. Left foot pain Will start with obtaining xrays of foot and advised to rest and ice foot. Continue anti-inflammatory. Call or go to ED if new or worsening symptoms - DG Foot Complete Left; Future  2. Essential hypertension Stable, continue current medication   General Counseling: December verbalizes understanding of the findings of todays visit and agrees with plan of treatment. I have discussed any  further diagnostic evaluation that may be needed or ordered today. We also reviewed her medications today. she has been encouraged to call the office with any questions or concerns that should arise related to todays visit.    Orders Placed This Encounter  Procedures   DG Foot Complete Left    Baldwin orders of the defined types were placed in this encounter.   This patient was seen by Drema Dallas, PA-C in collaboration with Dr. Clayborn Bigness as a part of collaborative care agreement.   Total time spent:30 Minutes Time spent includes review of chart, medications, test results, and follow up plan with the patient.      Dr Lavera Guise Internal medicine

## 2022-07-02 NOTE — Telephone Encounter (Signed)
Please review that she still taking pt had appt yesterday

## 2022-07-05 ENCOUNTER — Telehealth: Payer: Self-pay

## 2022-07-05 NOTE — Telephone Encounter (Signed)
-----  Message from Mylinda Latina, PA-C sent at 07/05/2022  8:33 AM EST ----- Please let her know that her xray did not show any acute findings but does show arthritis

## 2022-07-05 NOTE — Telephone Encounter (Signed)
Spoke with patient regarding foot x-ray results.

## 2022-07-27 ENCOUNTER — Other Ambulatory Visit: Payer: Self-pay | Admitting: Physician Assistant

## 2022-07-27 DIAGNOSIS — I10 Essential (primary) hypertension: Secondary | ICD-10-CM

## 2022-07-27 DIAGNOSIS — K2100 Gastro-esophageal reflux disease with esophagitis, without bleeding: Secondary | ICD-10-CM

## 2022-08-07 ENCOUNTER — Encounter: Payer: Self-pay | Admitting: Physician Assistant

## 2022-08-09 ENCOUNTER — Other Ambulatory Visit: Payer: Self-pay

## 2022-08-09 ENCOUNTER — Telehealth: Payer: Self-pay

## 2022-08-09 MED ORDER — AZITHROMYCIN 250 MG PO TABS: 250.0000 mg | ORAL_TABLET | Freq: Every day | ORAL | 0 refills | Status: DC

## 2022-08-09 MED ORDER — PREDNISONE 10 MG PO TABS: ORAL_TABLET | ORAL | 0 refills | Status: DC

## 2022-08-09 NOTE — Telephone Encounter (Signed)
Pt called that she went to urgent care on Saturday she had Covid they send paxlovid is not covered by her insurance its 1400 dollar so she don't pickup as per lauren send her zithromax and prednisone advised her if she is not feeling bette need virtual visit

## 2022-08-11 ENCOUNTER — Telehealth: Payer: Self-pay | Admitting: Physician Assistant

## 2022-08-11 NOTE — Telephone Encounter (Signed)
Work note emailed to [employer]

## 2022-08-17 NOTE — Telephone Encounter (Signed)
Error

## 2022-08-20 ENCOUNTER — Encounter: Payer: Self-pay | Admitting: Nurse Practitioner

## 2022-08-20 ENCOUNTER — Telehealth (INDEPENDENT_AMBULATORY_CARE_PROVIDER_SITE_OTHER): Payer: 59 | Admitting: Nurse Practitioner

## 2022-08-20 VITALS — Temp 97.7°F | Ht 63.0 in | Wt 295.0 lb

## 2022-08-20 DIAGNOSIS — J0100 Acute maxillary sinusitis, unspecified: Secondary | ICD-10-CM | POA: Diagnosis not present

## 2022-08-20 DIAGNOSIS — R110 Nausea: Secondary | ICD-10-CM

## 2022-08-20 MED ORDER — PROMETHAZINE HCL 12.5 MG PO TABS: ORAL_TABLET | ORAL | 1 refills | Status: DC

## 2022-08-20 MED ORDER — AMOXICILLIN-POT CLAVULANATE 875-125 MG PO TABS: 1.0000 | ORAL_TABLET | Freq: Two times a day (BID) | ORAL | 0 refills | Status: AC

## 2022-08-20 NOTE — Progress Notes (Signed)
[facility] [place], [place] [address]  Internal MEDICINE  Telephone Visit  Patient Name: Mandy Baldwin  [id number]  [id number]  Date of Service: 08/20/2022  I connected with the patient at [place] by telephone and verified the patients identity using two identifiers.   I discussed the limitations, risks, security and privacy concerns of performing an evaluation and management service by telephone and the availability of in person appointments. I also discussed with the patient that there may be a patient responsible charge related to the service.  The patient expressed understanding and agrees to proceed.    Chief Complaint  Patient presents with   Telephone Assessment   Telephone Screen   Sinusitis   Cough    Sinusitis Associated symptoms include congestion, coughing, headaches, sinus pressure and a sore throat. Pertinent negatives include no shortness of breath.  Cough Associated symptoms include headaches, postnasal drip, rhinorrhea and a sore throat. Pertinent negatives include no chest pain, shortness of breath or wheezing.   Mandy Baldwin presents for a telehealth virtual visit for symptoms of sinusitis post covid   Current Medication: Outpatient Encounter Medications as of 08/20/2022  Medication Sig   amoxicillin-clavulanate (AUGMENTIN) 875-125 MG tablet Take 1 tablet by mouth 2 (two) times daily for 10 days. Take with food   aspirin 81 MG tablet Take 81 mg by mouth daily.   buPROPion (WELLBUTRIN) 75 MG tablet TAKE 1 TABLET BY MOUTH ONCE DAILY . APPOINTMENT REQUIRED FOR FUTURE REFILLS   cholecalciferol (VITAMIN D) 1000 UNITS tablet Take 2,000 Units by mouth daily.   cyclobenzaprine (FLEXERIL) 5 MG tablet Take 1 tablet (5 mg total) by mouth 3 (three) times daily as needed for muscle spasms.   fluticasone (FLONASE) 50 MCG/ACT nasal spray Use 2 spray(s) in each nostril once daily   magnesium oxide (MAG-OX) 400 MG tablet Take 400 mg by mouth daily.    meloxicam (MOBIC) 7.5 MG tablet Take 1 tablet by mouth once daily   metoprolol succinate (TOPROL-XL) 25 MG 24 hr tablet Take 2 tablets by mouth once daily   Multiple Vitamins-Minerals (MULTIVITAL PO) Take 1 tablet by mouth daily.   pantoprazole (PROTONIX) 40 MG tablet Take 1 tablet by mouth twice daily   triamcinolone cream (KENALOG) 0.1 % Apply 1 application. topically 2 (two) times daily as needed (itching and rash).   triamterene-hydrochlorothiazide (MAXZIDE-25) 37.5-25 MG tablet TAKE 1 TABLET BY MOUTH ONCE DAILY . APPOINTMENT REQUIRED FOR FUTURE REFILLS   [DISCONTINUED] promethazine (PHENERGAN) 12.5 MG tablet TAKE 1 TABLET BY MOUTH EVERY 8 HOURS AS NEEDED FOR NAUSEA AND VOMITING   atorvastatin (LIPITOR) 20 MG tablet Take 1 tablet (20 mg total) by mouth daily. pm   promethazine (PHENERGAN) 12.5 MG tablet TAKE 1 TABLET BY MOUTH EVERY 8 HOURS AS NEEDED FOR NAUSEA AND VOMITING   [DISCONTINUED] azithromycin (ZITHROMAX) 250 MG tablet Take 1 tablet (250 mg total) by mouth daily.   [DISCONTINUED] predniSONE (DELTASONE) 10 MG tablet Take 1 tab po 3 x day for 3 days then take 1 tab po 2 x a day for 3 days and then take 1 tab po daily for 3 days   No facility-administered encounter medications on file as of 08/20/2022.    Surgical History: Past Surgical History:  Procedure Laterality Date   COLONOSCOPY     COLONOSCOPY WITH PROPOFOL N/A 02/15/2020   Procedure: COLONOSCOPY WITH PROPOFOL;  Surgeon: Lucilla Lame, MD;  Location: [facility];  Service: Endoscopy;  Laterality: N/A;  priority 4  ESOPHAGOGASTRODUODENOSCOPY (EGD) WITH PROPOFOL N/A 09/18/2015   Procedure: ESOPHAGOGASTRODUODENOSCOPY (EGD) WITH PROPOFOL with ballon dilatation;  Surgeon: Lucilla Lame, MD;  Location: [facility];  Service: Endoscopy;  Laterality: N/A;   LAPAROSCOPIC OVARIAN      Medical History: Past Medical History:  Diagnosis Date   GERD (gastroesophageal reflux disease)    Hyperlipidemia    Hypertension     Knee pain    right knee, bone on bone   [place] sickness    [place]   Osteoarthritis of both knees    Shingles 12/14/2014   TMJ (dislocation of temporomandibular joint)     Family History: Family History  Problem Relation Age of Onset   Dementia Mother    Aortic aneurysm Mother    COPD Father    Breast cancer Sister 88   Diabetes Sister    COPD Brother    Cancer Brother    Stroke Sister     Social History   Socioeconomic History   Marital status: Married    Spouse name: Mandy Baldwin   Number of children: 0   Years of education: 13   Highest education level: Not on file  Occupational History   Occupation: Therapist, art    Comment: Research scientist (medical): [employer]  Tobacco Use   Smoking status: Never   Smokeless tobacco: Never  Vaping Use   Vaping Use: Never used  Substance and Sexual Activity   Alcohol use: No   Drug use: No   Sexual activity: Not on file  Other Topics Concern   Not on file  Social History Narrative   Not on file   Social Determinants of Health   Financial Resource Strain: Not on file  Food Insecurity: Not on file  Transportation Needs: Not on file  Physical Activity: Unknown (04/14/2017)   Exercise Vital Sign    Days of Exercise per Week: 0 days    Minutes of Exercise per Session: Not on file  Stress: Not on file  Social Connections: Not on file  Intimate Partner Violence: Not on file      Review of Systems  Constitutional:  Positive for fatigue.  HENT:  Positive for congestion, postnasal drip, rhinorrhea, sinus pressure, sinus pain and sore throat.   Respiratory:  Positive for cough. Negative for chest tightness, shortness of breath and wheezing.   Cardiovascular:  Negative for chest pain and palpitations.  Neurological:  Positive for headaches.    Vital Signs: Temp 97.7 F (36.5 C)   Ht 5\' 3"  (1.6 m)   Wt 295 lb (133.8 kg)   BMI 52.26 kg/m    Observation/Objective: She is alert and oriented and  engages in conversation appropriately. No acute distress noted.     Assessment/Plan: 1. Acute non-recurrent maxillary sinusitis Empiric antibiotic treatment prescribed.  - amoxicillin-clavulanate (AUGMENTIN) 875-125 MG tablet; Take 1 tablet by mouth 2 (two) times daily for 10 days. Take with food  Dispense: 20 tablet; Refill: 0  2. Nausea Refill of promethazine ordered - promethazine (PHENERGAN) 12.5 MG tablet; TAKE 1 TABLET BY MOUTH EVERY 8 HOURS AS NEEDED FOR NAUSEA AND VOMITING  Dispense: 20 tablet; Refill: 1   General Counseling: Brigitta verbalizes understanding of the findings of today's phone visit and agrees with plan of treatment. I have discussed any further diagnostic evaluation that may be needed or ordered today. We also reviewed her medications today. she has been encouraged to call the office with any questions or concerns that should arise  related to todays visit.  Return if symptoms worsen or fail to improve.   No orders of the defined types were placed in this encounter.   Meds ordered this encounter  Medications   amoxicillin-clavulanate (AUGMENTIN) 875-125 MG tablet    Sig: Take 1 tablet by mouth 2 (two) times daily for 10 days. Take with food    Dispense:  20 tablet    Refill:  0   promethazine (PHENERGAN) 12.5 MG tablet    Sig: TAKE 1 TABLET BY MOUTH EVERY 8 HOURS AS NEEDED FOR NAUSEA AND VOMITING    Dispense:  20 tablet    Refill:  1    Time spent:10 Minutes Time spent with patient included reviewing progress notes, labs, imaging studies, and discussing plan for follow up.  [place] Controlled Substance Database was reviewed by me for overdose risk score (ORS) if appropriate.  This patient was seen by Jonetta Osgood, FNP-C in collaboration with Dr. Clayborn Bigness as a part of collaborative care agreement.  Cristen Murcia R. Valetta Fuller, MSN, FNP-C Internal medicine

## 2022-08-22 ENCOUNTER — Other Ambulatory Visit: Payer: Self-pay | Admitting: Physician Assistant

## 2022-08-22 DIAGNOSIS — K2100 Gastro-esophageal reflux disease with esophagitis, without bleeding: Secondary | ICD-10-CM

## 2022-08-23 ENCOUNTER — Other Ambulatory Visit: Payer: Self-pay | Admitting: Physician Assistant

## 2022-08-23 DIAGNOSIS — Z1231 Encounter for screening mammogram for malignant neoplasm of breast: Secondary | ICD-10-CM

## 2022-09-08 ENCOUNTER — Ambulatory Visit
Admission: RE | Admit: 2022-09-08 | Discharge: 2022-09-08 | Disposition: A | Discharge status: Home / Self Care | Payer: 59 | Source: Ambulatory Visit | Attending: Physician Assistant | Admitting: Physician Assistant

## 2022-09-08 DIAGNOSIS — Z1231 Encounter for screening mammogram for malignant neoplasm of breast: Secondary | ICD-10-CM | POA: Insufficient documentation

## 2022-09-09 ENCOUNTER — Other Ambulatory Visit: Payer: Self-pay | Admitting: Physician Assistant

## 2022-09-09 DIAGNOSIS — N63 Unspecified lump in unspecified breast: Secondary | ICD-10-CM

## 2022-09-09 DIAGNOSIS — R928 Other abnormal and inconclusive findings on diagnostic imaging of breast: Secondary | ICD-10-CM

## 2022-09-14 ENCOUNTER — Ambulatory Visit
Admission: RE | Admit: 2022-09-14 | Discharge: 2022-09-14 | Disposition: A | Discharge status: Home / Self Care | Payer: 59 | Source: Ambulatory Visit | Attending: Physician Assistant | Admitting: Physician Assistant

## 2022-09-14 DIAGNOSIS — N63 Unspecified lump in unspecified breast: Secondary | ICD-10-CM | POA: Diagnosis present

## 2022-09-14 DIAGNOSIS — R928 Other abnormal and inconclusive findings on diagnostic imaging of breast: Secondary | ICD-10-CM | POA: Diagnosis present

## 2022-09-20 ENCOUNTER — Other Ambulatory Visit: Payer: Self-pay | Admitting: Physician Assistant

## 2022-09-20 DIAGNOSIS — I10 Essential (primary) hypertension: Secondary | ICD-10-CM

## 2022-09-28 ENCOUNTER — Other Ambulatory Visit: Payer: Self-pay | Admitting: Physician Assistant

## 2022-09-28 DIAGNOSIS — M170 Bilateral primary osteoarthritis of knee: Secondary | ICD-10-CM

## 2022-09-28 DIAGNOSIS — K2100 Gastro-esophageal reflux disease with esophagitis, without bleeding: Secondary | ICD-10-CM

## 2022-10-24 ENCOUNTER — Other Ambulatory Visit: Payer: Self-pay | Admitting: Physician Assistant

## 2022-10-24 DIAGNOSIS — I10 Essential (primary) hypertension: Secondary | ICD-10-CM

## 2022-10-28 ENCOUNTER — Ambulatory Visit (INDEPENDENT_AMBULATORY_CARE_PROVIDER_SITE_OTHER): Payer: 59 | Admitting: Physician Assistant

## 2022-10-28 ENCOUNTER — Encounter: Payer: Self-pay | Admitting: Physician Assistant

## 2022-10-28 DIAGNOSIS — M170 Bilateral primary osteoarthritis of knee: Secondary | ICD-10-CM

## 2022-10-28 DIAGNOSIS — E782 Mixed hyperlipidemia: Secondary | ICD-10-CM | POA: Diagnosis not present

## 2022-10-28 DIAGNOSIS — Z6841 Body Mass Index (BMI) 40.0 and over, adult: Secondary | ICD-10-CM

## 2022-10-28 DIAGNOSIS — K2100 Gastro-esophageal reflux disease with esophagitis, without bleeding: Secondary | ICD-10-CM

## 2022-10-28 DIAGNOSIS — I10 Essential (primary) hypertension: Secondary | ICD-10-CM

## 2022-10-28 MED ORDER — PANTOPRAZOLE SODIUM 40 MG PO TBEC: 40.0000 mg | DELAYED_RELEASE_TABLET | Freq: Every day | ORAL | 1 refills | Status: DC

## 2022-10-28 MED ORDER — ATORVASTATIN CALCIUM 20 MG PO TABS
20.0000 mg | ORAL_TABLET | Freq: Every day | ORAL | 1 refills | Status: AC
Start: 2022-10-28 — End: 2024-03-22

## 2022-10-28 MED ORDER — TRIAMTERENE-HCTZ 37.5-25 MG PO TABS
ORAL_TABLET | ORAL | 1 refills | Status: DC
Start: 2022-10-28 — End: 2023-06-14

## 2022-10-28 MED ORDER — BUPROPION HCL 75 MG PO TABS: ORAL_TABLET | ORAL | 1 refills | Status: DC

## 2022-10-28 MED ORDER — MELOXICAM 7.5 MG PO TABS: 7.5000 mg | ORAL_TABLET | Freq: Every day | ORAL | 0 refills | Status: DC

## 2022-10-28 NOTE — Progress Notes (Signed)
[facility] [address] [place], [place] [address]  Internal MEDICINE  Office Visit Note  Patient Name: Mandy Baldwin  [id number]  [id number]  Date of Service: 11/03/2022  Chief Complaint  Patient presents with   Follow-up   Gastroesophageal Reflux   Hyperlipidemia   Hypertension    HPI Pt is here for routine follow up -Mammogram done -Planning to get RSV vaccine once eligible -Is taking care of husband and will often not sleep well due to listening out for him and does not want to take medication that might prevent her from hearing or responding to him -BP stable -Does need med refills  Current Medication: Outpatient Encounter Medications as of 10/28/2022  Medication Sig   aspirin 81 MG tablet Take 81 mg by mouth daily.   cholecalciferol (VITAMIN D) 1000 UNITS tablet Take 2,000 Units by mouth daily.   cyclobenzaprine (FLEXERIL) 5 MG tablet Take 1 tablet (5 mg total) by mouth 3 (three) times daily as needed for muscle spasms.   fluticasone (FLONASE) 50 MCG/ACT nasal spray Use 2 spray(s) in each nostril once daily   magnesium oxide (MAG-OX) 400 MG tablet Take 400 mg by mouth daily.   metoprolol succinate (TOPROL-XL) 25 MG 24 hr tablet Take 2 tablets by mouth once daily   Multiple Vitamins-Minerals (MULTIVITAL PO) Take 1 tablet by mouth daily.   promethazine (PHENERGAN) 12.5 MG tablet TAKE 1 TABLET BY MOUTH EVERY 8 HOURS AS NEEDED FOR NAUSEA AND VOMITING   triamcinolone cream (KENALOG) 0.1 % Apply 1 application. topically 2 (two) times daily as needed (itching and rash).   [DISCONTINUED] buPROPion (WELLBUTRIN) 75 MG tablet TAKE 1 TABLET BY MOUTH ONCE DAILY (APPOINTMENT  REQUIRED  FOR  FUTURE  REFILLS)   [DISCONTINUED] meloxicam (MOBIC) 7.5 MG tablet Take 1 tablet by mouth once daily   [DISCONTINUED] pantoprazole (PROTONIX) 40 MG tablet Take 1 tablet by mouth twice daily   [DISCONTINUED] triamterene-hydrochlorothiazide (MAXZIDE-25) 37.5-25 MG tablet TAKE 1  TABLET BY MOUTH ONCE DAILY. APPOINTMENT REQUIRED FOR FUTURE REFILLS.   atorvastatin (LIPITOR) 20 MG tablet Take 1 tablet (20 mg total) by mouth daily. pm   buPROPion (WELLBUTRIN) 75 MG tablet TAKE 1 TABLET BY MOUTH ONCE DAILY   meloxicam (MOBIC) 7.5 MG tablet Take 1 tablet (7.5 mg total) by mouth daily.   pantoprazole (PROTONIX) 40 MG tablet Take 1 tablet (40 mg total) by mouth daily.   triamterene-hydrochlorothiazide (MAXZIDE-25) 37.5-25 MG tablet TAKE 1 TABLET BY MOUTH ONCE DAILY.   [DISCONTINUED] atorvastatin (LIPITOR) 20 MG tablet Take 1 tablet (20 mg total) by mouth daily. pm   No facility-administered encounter medications on file as of 10/28/2022.    Surgical History: Past Surgical History:  Procedure Laterality Date   COLONOSCOPY     COLONOSCOPY WITH PROPOFOL N/A 02/15/2020   Procedure: COLONOSCOPY WITH PROPOFOL;  Surgeon: [name], MD;  Location: [facility];  Service: Endoscopy;  Laterality: N/A;  priority 4   ESOPHAGOGASTRODUODENOSCOPY (EGD) WITH PROPOFOL N/A 09/18/2015   Procedure: ESOPHAGOGASTRODUODENOSCOPY (EGD) WITH PROPOFOL with ballon dilatation;  Surgeon: [name], MD;  Location: [facility];  Service: Endoscopy;  Laterality: N/A;   LAPAROSCOPIC OVARIAN      Medical History: Past Medical History:  Diagnosis Date   GERD (gastroesophageal reflux disease)    Hyperlipidemia    Hypertension    Knee pain    right knee, bone on bone   Motion sickness    [place]   Osteoarthritis of both knees  Shingles 12/14/2014   TMJ (dislocation of temporomandibular joint)     Family History: Family History  Problem Relation Age of Onset   Dementia Mother    Aortic aneurysm Mother    COPD Father    Breast cancer Sister 5   Diabetes Sister    COPD Brother    Cancer Brother    Stroke Sister     Social History   Socioeconomic History   Marital status: Married    Spouse name: Helana Knable   Number of children: 0   Years of education: 13    Highest education level: Not on file  Occupational History   Occupation: Clinical biochemist    Comment: Production manager: [employer]  Tobacco Use   Smoking status: Never   Smokeless tobacco: Never  Vaping Use   Vaping Use: Never used  Substance and Sexual Activity   Alcohol use: No   Drug use: No   Sexual activity: Not on file  Other Topics Concern   Not on file  Social History Narrative   Not on file   Social Determinants of Health   Financial Resource Strain: Not on file  Food Insecurity: Not on file  Transportation Needs: Not on file  Physical Activity: Unknown (04/14/2017)   Exercise Vital Sign    Days of Exercise per Week: 0 days    Minutes of Exercise per Session: Not on file  Stress: Not on file  Social Connections: Not on file  Intimate Partner Violence: Not on file      Review of Systems  Constitutional:  Negative for chills, fatigue and unexpected weight change.  HENT:  Positive for postnasal drip. Negative for congestion, rhinorrhea, sneezing and sore throat.   Eyes:  Negative for redness.  Respiratory:  Negative for cough, chest tightness and shortness of breath.   Cardiovascular:  Negative for chest pain and palpitations.  Gastrointestinal:  Negative for abdominal pain, constipation, diarrhea, nausea and vomiting.  Genitourinary:  Negative for dysuria and frequency.  Musculoskeletal:  Positive for arthralgias. Negative for back pain, joint swelling and neck pain.  Skin:  Negative for rash.  Neurological: Negative.  Negative for tremors and numbness.  Hematological:  Negative for adenopathy. Does not bruise/bleed easily.  Psychiatric/Behavioral:  Positive for sleep disturbance. Negative for behavioral problems (Depression) and suicidal ideas. The patient is not nervous/anxious.     Vital Signs: BP 130/86 Comment: 140/90  Pulse 75   Temp 98.4 F (36.9 C)   Resp 16   Ht 5\' 3"  (1.6 m)   Wt 294 lb 9.6 oz (133.6 kg)   SpO2 96%   BMI  52.19 kg/m    Physical Exam Constitutional:      General: She is not in acute distress.    Appearance: She is well-developed. She is not diaphoretic.  HENT:     Head: Normocephalic and atraumatic.     Mouth/Throat:     Pharynx: No oropharyngeal exudate.  Eyes:     Pupils: Pupils are equal, round, and reactive to light.  Neck:     Thyroid: No thyromegaly.     Vascular: No JVD.     Trachea: No tracheal deviation.  Cardiovascular:     Rate and Rhythm: Normal rate and regular rhythm.     Heart sounds: Normal heart sounds. No murmur heard.    No friction rub. No gallop.  Pulmonary:     Effort: Pulmonary effort is normal. No respiratory distress.  Breath sounds: No wheezing or rales.  Chest:     Chest wall: No tenderness.  Abdominal:     General: Bowel sounds are normal.     Palpations: Abdomen is soft.  Musculoskeletal:        General: Normal range of motion.     Cervical back: Normal range of motion and neck supple.  Lymphadenopathy:     Cervical: No cervical adenopathy.  Skin:    General: Skin is warm and dry.  Neurological:     Mental Status: She is alert and oriented to person, place, and time.     Cranial Nerves: No cranial nerve deficit.  Psychiatric:        Behavior: Behavior normal.        Thought Content: Thought content normal.        Judgment: Judgment normal.        Assessment/Plan: 1. Essential hypertension Stable, continue current medication - triamterene-hydrochlorothiazide (MAXZIDE-25) 37.5-25 MG tablet; TAKE 1 TABLET BY MOUTH ONCE DAILY.  Dispense: 90 tablet; Refill: 1  2. Gastroesophageal reflux disease with esophagitis without hemorrhage - pantoprazole (PROTONIX) 40 MG tablet; Take 1 tablet (40 mg total) by mouth daily.  Dispense: 90 tablet; Refill: 1  3. Mixed hyperlipidemia - atorvastatin (LIPITOR) 20 MG tablet; Take 1 tablet (20 mg total) by mouth daily. pm  Dispense: 90 tablet; Refill: 1  4. Arthritis of both knees May continue mobic  prn - meloxicam (MOBIC) 7.5 MG tablet; Take 1 tablet (7.5 mg total) by mouth daily.  Dispense: 90 tablet; Refill: 0  5. Morbid obesity with BMI of 50.0-59.9, adult (HCC) Continue to work on diet and exercise and may continue wellbutrin as before - buPROPion (WELLBUTRIN) 75 MG tablet; TAKE 1 TABLET BY MOUTH ONCE DAILY  Dispense: 90 tablet; Refill: 1   General Counseling: Sun verbalizes understanding of the findings of todays visit and agrees with plan of treatment. I have discussed any further diagnostic evaluation that may be needed or ordered today. We also reviewed her medications today. she has been encouraged to call the office with any questions or concerns that should arise related to todays visit.    No orders of the defined types were placed in this encounter.   Meds ordered this encounter  Medications   buPROPion (WELLBUTRIN) 75 MG tablet    Sig: TAKE 1 TABLET BY MOUTH ONCE DAILY    Dispense:  90 tablet    Refill:  1   pantoprazole (PROTONIX) 40 MG tablet    Sig: Take 1 tablet (40 mg total) by mouth daily.    Dispense:  90 tablet    Refill:  1   triamterene-hydrochlorothiazide (MAXZIDE-25) 37.5-25 MG tablet    Sig: TAKE 1 TABLET BY MOUTH ONCE DAILY.    Dispense:  90 tablet    Refill:  1   atorvastatin (LIPITOR) 20 MG tablet    Sig: Take 1 tablet (20 mg total) by mouth daily. pm    Dispense:  90 tablet    Refill:  1   meloxicam (MOBIC) 7.5 MG tablet    Sig: Take 1 tablet (7.5 mg total) by mouth daily.    Dispense:  90 tablet    Refill:  0    This patient was seen by Lynn Ito, PA-C in collaboration with Dr. Beverely Risen as a part of collaborative care agreement.   Total time spent:30 Minutes Time spent includes review of chart, medications, test results, and follow up plan with the patient.  Dr Lyndon Code Internal medicine

## 2022-10-29 ENCOUNTER — Ambulatory Visit: Payer: 59 | Admitting: Physician Assistant

## 2022-11-01 ENCOUNTER — Other Ambulatory Visit: Payer: Self-pay | Admitting: Physician Assistant

## 2022-11-01 DIAGNOSIS — K2100 Gastro-esophageal reflux disease with esophagitis, without bleeding: Secondary | ICD-10-CM

## 2022-11-05 ENCOUNTER — Ambulatory Visit (INDEPENDENT_AMBULATORY_CARE_PROVIDER_SITE_OTHER): Payer: 59 | Admitting: Physician Assistant

## 2022-11-05 VITALS — BP 140/90 | HR 71 | Temp 97.8°F | Resp 16 | Ht 63.0 in | Wt 298.4 lb

## 2022-11-05 DIAGNOSIS — J0110 Acute frontal sinusitis, unspecified: Secondary | ICD-10-CM | POA: Diagnosis not present

## 2022-11-05 DIAGNOSIS — R208 Other disturbances of skin sensation: Secondary | ICD-10-CM | POA: Diagnosis not present

## 2022-11-05 MED ORDER — AMOXICILLIN-POT CLAVULANATE 875-125 MG PO TABS
1.0000 | ORAL_TABLET | Freq: Two times a day (BID) | ORAL | 0 refills | Status: DC
Start: 2022-11-05 — End: 2023-02-14

## 2022-11-05 NOTE — Progress Notes (Signed)
[facility] [address] [place], [place] [address]  Internal MEDICINE  Office Visit Note  Patient Name: Mandy Baldwin  [id number]  [id number]  Date of Service: 11/05/2022  Chief Complaint  Patient presents with   Acute Visit   Skin Problem    Burning sensation on temple       HPI Pt is here for a sick visit. -Left temple started burning yesterday intermittently, states it may last 10-41mins then feel better for a little bit and come back again -a little left side pressure and did have sinus drainage this morning and blew a lot out of her nose -No sharp or shooting pain or headache, not tender to the touch, just a little pressure and burning  -No vision changes or radiating pain toward jaw -no hearing changes, no ear pain -had shingles previously and doesn't feel like it did then and has had shingles vaccines  -No rash present -Discussed several possible causes and signs to watch out for. Given intermittent burning with sinus congestion and no other headache, pain, or vision changes will monitor and treat sinuses, but urged to go to ED if any new or worsening changes as these could be sign of an emergent condition  Current Medication:  Outpatient Encounter Medications as of 11/05/2022  Medication Sig   amoxicillin-clavulanate (AUGMENTIN) 875-125 MG tablet Take 1 tablet by mouth 2 (two) times daily. Take with food.   aspirin 81 MG tablet Take 81 mg by mouth daily.   atorvastatin (LIPITOR) 20 MG tablet Take 1 tablet (20 mg total) by mouth daily. pm   buPROPion (WELLBUTRIN) 75 MG tablet TAKE 1 TABLET BY MOUTH ONCE DAILY   cholecalciferol (VITAMIN D) 1000 UNITS tablet Take 2,000 Units by mouth daily.   cyclobenzaprine (FLEXERIL) 5 MG tablet Take 1 tablet (5 mg total) by mouth 3 (three) times daily as needed for muscle spasms.   fluticasone (FLONASE) 50 MCG/ACT nasal spray Use 2 spray(s) in each nostril once daily   magnesium oxide (MAG-OX) 400 MG tablet Take  400 mg by mouth daily.   meloxicam (MOBIC) 7.5 MG tablet Take 1 tablet (7.5 mg total) by mouth daily.   metoprolol succinate (TOPROL-XL) 25 MG 24 hr tablet Take 2 tablets by mouth once daily   Multiple Vitamins-Minerals (MULTIVITAL PO) Take 1 tablet by mouth daily.   pantoprazole (PROTONIX) 40 MG tablet Take 1 tablet (40 mg total) by mouth daily.   promethazine (PHENERGAN) 12.5 MG tablet TAKE 1 TABLET BY MOUTH EVERY 8 HOURS AS NEEDED FOR NAUSEA AND VOMITING   triamcinolone cream (KENALOG) 0.1 % Apply 1 application. topically 2 (two) times daily as needed (itching and rash).   triamterene-hydrochlorothiazide (MAXZIDE-25) 37.5-25 MG tablet TAKE 1 TABLET BY MOUTH ONCE DAILY.   No facility-administered encounter medications on file as of 11/05/2022.      Medical History: Past Medical History:  Diagnosis Date   GERD (gastroesophageal reflux disease)    Hyperlipidemia    Hypertension    Knee pain    right knee, bone on bone   Motion sickness    [place]   Osteoarthritis of both knees    Shingles 12/14/2014   TMJ (dislocation of temporomandibular joint)      Vital Signs: BP (!) 140/90   Pulse 71   Temp 97.8 F (36.6 C)   Resp 16   Ht 5\' 3"  (1.6 m)   Wt 298 lb 6.4 oz (135.4 kg)   SpO2 97%   BMI 52.86  kg/m    Review of Systems  Constitutional:  Negative for fatigue and fever.  HENT:  Positive for congestion, postnasal drip and sinus pressure. Negative for ear pain, facial swelling, hearing loss and mouth sores.        Left temple burning  Eyes:  Negative for photophobia, pain, redness and visual disturbance.  Respiratory:  Negative for cough.   Cardiovascular:  Negative for chest pain.  Genitourinary:  Negative for flank pain.  Musculoskeletal:  Negative for neck pain.  Skin:  Negative for rash.  Neurological:  Negative for facial asymmetry, speech difficulty, weakness, numbness and headaches.  Psychiatric/Behavioral: Negative.      Physical Exam Vitals and  nursing note reviewed.  Constitutional:      Appearance: Normal appearance. She is obese.  HENT:     Head: Normocephalic and atraumatic.     Comments: No tenderness or skin changes over left temple where intermittent burning occurring    Left Ear: Tympanic membrane normal.     Nose: Congestion present.     Mouth/Throat:     Pharynx: Posterior oropharyngeal erythema present.  Eyes:     Extraocular Movements: Extraocular movements intact.     Pupils: Pupils are equal, round, and reactive to light.  Cardiovascular:     Rate and Rhythm: Normal rate and regular rhythm.  Pulmonary:     Effort: Pulmonary effort is normal.     Breath sounds: Normal breath sounds.  Skin:    General: Skin is warm and dry.     Findings: No rash.  Neurological:     General: No focal deficit present.     Mental Status: She is alert. Mental status is at baseline.     Sensory: No sensory deficit.  Psychiatric:        Mood and Affect: Mood normal.        Behavior: Behavior normal.       Assessment/Plan: 1. Acute non-recurrent frontal sinusitis Will go ahead and treat for sinus infection, but advised to monitor symptoms closely and go to ED if any new or worsening symptoms. May use tylenol as needed - amoxicillin-clavulanate (AUGMENTIN) 875-125 MG tablet; Take 1 tablet by mouth 2 (two) times daily. Take with food.  Dispense: 20 tablet; Refill: 0  2. Facial burning Unclear etiology given no headache, vision changes, rash, or pain, however discussed possible conditions that could occur and symptoms to look out for as they could be emergent and patient will go to ED if any new or worsening symptoms arise. Pt expressed understanding.   General Counseling: Tulip verbalizes understanding of the findings of todays visit and agrees with plan of treatment. I have discussed any further diagnostic evaluation that may be needed or ordered today. We also reviewed her medications today. she has been encouraged to call the  office with any questions or concerns that should arise related to todays visit.    Counseling:    No orders of the defined types were placed in this encounter.   Meds ordered this encounter  Medications   amoxicillin-clavulanate (AUGMENTIN) 875-125 MG tablet    Sig: Take 1 tablet by mouth 2 (two) times daily. Take with food.    Dispense:  20 tablet    Refill:  0    Time spent:30 Minutes

## 2022-12-12 ENCOUNTER — Encounter: Payer: Self-pay | Admitting: Physician Assistant

## 2023-01-06 ENCOUNTER — Other Ambulatory Visit: Payer: Self-pay | Admitting: Physician Assistant

## 2023-01-06 DIAGNOSIS — I10 Essential (primary) hypertension: Secondary | ICD-10-CM

## 2023-01-10 ENCOUNTER — Telehealth: Payer: Self-pay

## 2023-01-11 ENCOUNTER — Other Ambulatory Visit: Payer: Self-pay | Admitting: Physician Assistant

## 2023-01-11 DIAGNOSIS — K2100 Gastro-esophageal reflux disease with esophagitis, without bleeding: Secondary | ICD-10-CM

## 2023-01-11 MED ORDER — PANTOPRAZOLE SODIUM 40 MG PO TBEC
40.0000 mg | DELAYED_RELEASE_TABLET | Freq: Two times a day (BID) | ORAL | 3 refills | Status: DC
Start: 2023-01-11 — End: 2023-05-03

## 2023-01-11 NOTE — Telephone Encounter (Signed)
Pt.notified

## 2023-02-13 ENCOUNTER — Encounter: Payer: Self-pay | Admitting: Physician Assistant

## 2023-02-14 ENCOUNTER — Encounter: Payer: Self-pay | Admitting: Physician Assistant

## 2023-02-14 ENCOUNTER — Ambulatory Visit (INDEPENDENT_AMBULATORY_CARE_PROVIDER_SITE_OTHER): Payer: 59 | Admitting: Physician Assistant

## 2023-02-14 VITALS — BP 120/88 | HR 77 | Temp 98.3°F | Resp 16 | Ht 63.0 in | Wt 289.9 lb

## 2023-02-14 DIAGNOSIS — R233 Spontaneous ecchymoses: Secondary | ICD-10-CM

## 2023-02-14 DIAGNOSIS — R946 Abnormal results of thyroid function studies: Secondary | ICD-10-CM

## 2023-02-14 DIAGNOSIS — E782 Mixed hyperlipidemia: Secondary | ICD-10-CM

## 2023-02-14 DIAGNOSIS — E559 Vitamin D deficiency, unspecified: Secondary | ICD-10-CM

## 2023-02-14 DIAGNOSIS — E538 Deficiency of other specified B group vitamins: Secondary | ICD-10-CM

## 2023-02-14 DIAGNOSIS — R5383 Other fatigue: Secondary | ICD-10-CM

## 2023-02-14 NOTE — Progress Notes (Signed)
[facility] [address] [place], [place] [address]  Internal MEDICINE  Office Visit Note  Patient Name: Mandy Baldwin  [id number]  [id number]  Date of Service: 02/14/2023  Chief Complaint  Patient presents with   Acute Visit   Skin Discoloration     HPI Pt is here for a sick visit. -Starting noticing increased bruising the past day or two -bruising along right lower leg after hitting stool lightly. Also has irregular patterned bruise on right forearm and does not recall hitting it on anything. Also has a few red dots on the back of both both forearms that started this morning. -Not painful -Taking 15mg  meloxicam now, started last month from ortho. This is only new change and does take 81mg  ASA with this. Discussed the increase NSAID may be increasing her risk of bruising/bleeding -Will need to check labs and she will decrease back to 7.5mg  mobic for now -not feeling more tired than usual, no change in BM, no blood in urine or stools -down 9 lbs since last visit, has been really working on this -working on diet, reducing carbs and sweets  Current Medication:  Outpatient Encounter Medications as of 02/14/2023  Medication Sig   aspirin 81 MG tablet Take 81 mg by mouth daily.   atorvastatin (LIPITOR) 20 MG tablet Take 1 tablet (20 mg total) by mouth daily. pm   buPROPion (WELLBUTRIN) 75 MG tablet TAKE 1 TABLET BY MOUTH ONCE DAILY   cholecalciferol (VITAMIN D) 1000 UNITS tablet Take 2,000 Units by mouth daily.   cyclobenzaprine (FLEXERIL) 5 MG tablet Take 1 tablet (5 mg total) by mouth 3 (three) times daily as needed for muscle spasms.   fluticasone (FLONASE) 50 MCG/ACT nasal spray Use 2 spray(s) in each nostril once daily   magnesium oxide (MAG-OX) 400 MG tablet Take 400 mg by mouth daily.   meloxicam (MOBIC) 7.5 MG tablet Take 1 tablet (7.5 mg total) by mouth daily.   metoprolol succinate (TOPROL-XL) 25 MG 24 hr tablet Take 2 tablets by mouth once daily    Multiple Vitamins-Minerals (MULTIVITAL PO) Take 1 tablet by mouth daily.   pantoprazole (PROTONIX) 40 MG tablet Take 1 tablet (40 mg total) by mouth 2 (two) times daily.   promethazine (PHENERGAN) 12.5 MG tablet TAKE 1 TABLET BY MOUTH EVERY 8 HOURS AS NEEDED FOR NAUSEA AND VOMITING   triamcinolone cream (KENALOG) 0.1 % Apply 1 application. topically 2 (two) times daily as needed (itching and rash).   triamterene-hydrochlorothiazide (MAXZIDE-25) 37.5-25 MG tablet TAKE 1 TABLET BY MOUTH ONCE DAILY.   [DISCONTINUED] amoxicillin-clavulanate (AUGMENTIN) 875-125 MG tablet Take 1 tablet by mouth 2 (two) times daily. Take with food.   No facility-administered encounter medications on file as of 02/14/2023.      Medical History: Past Medical History:  Diagnosis Date   GERD (gastroesophageal reflux disease)    Hyperlipidemia    Hypertension    Knee pain    right knee, bone on bone   Motion sickness    [place]   Osteoarthritis of both knees    Shingles 12/14/2014   TMJ (dislocation of temporomandibular joint)      Vital Signs: BP 120/88 Comment: 115/90  Pulse 77   Temp 98.3 F (36.8 C)   Resp 16   Ht 5\' 3"  (1.6 m)   Wt 289 lb 14.4 oz (131.5 kg)   SpO2 97%   BMI 51.35 kg/m    Review of Systems  Constitutional:  Negative for fatigue and  fever.  HENT:  Negative for congestion, mouth sores and postnasal drip.   Respiratory:  Negative for cough.   Cardiovascular:  Negative for chest pain.  Gastrointestinal:  Negative for anal bleeding and blood in stool.  Genitourinary:  Negative for flank pain and hematuria.  Musculoskeletal:  Positive for arthralgias.  Neurological:  Negative for weakness.  Hematological:  Bruises/bleeds easily.  Psychiatric/Behavioral: Negative.      Physical Exam Vitals and nursing note reviewed.  Constitutional:      Appearance: Normal appearance. She is obese.  HENT:     Head: Normocephalic and atraumatic.  Eyes:     Extraocular Movements:  Extraocular movements intact.     Pupils: Pupils are equal, round, and reactive to light.  Cardiovascular:     Rate and Rhythm: Normal rate and regular rhythm.  Pulmonary:     Effort: Pulmonary effort is normal.     Breath sounds: Normal breath sounds.  Skin:    Findings: Bruising present.     Comments: Large bruise along lower right leg, no swelling. Bruising along right forearm already in stages of healing. Few petechia along back of both forearms.  Neurological:     General: No focal deficit present.     Mental Status: She is alert and oriented to person, place, and time.  Psychiatric:        Mood and Affect: Mood normal.        Behavior: Behavior normal.       Assessment/Plan: 1. Abnormal bruising Will check labs, advised to decrease mobic back to 7.5mg  for now. May need to d/c temporarily. Advised if sudden acute worsening of bruising or new symptoms arise to call or go to ED - CBC w/Diff/Platelet - Fe+TIBC+Fer - B12 and Folate Panel  2. Mixed hyperlipidemia - Lipid Panel With LDL/HDL Ratio  3. Vitamin D deficiency - VITAMIN D 25 Hydroxy (Vit-D Deficiency, Fractures)  4. Abnormal thyroid exam - TSH + free T4  5. B12 deficiency - B12 and Folate Panel  6. Other fatigue - CBC w/Diff/Platelet - Comprehensive metabolic panel - TSH + free T4 - Lipid Panel With LDL/HDL Ratio - VITAMIN D 25 Hydroxy (Vit-D Deficiency, Fractures) - Fe+TIBC+Fer - B12 and Folate Panel   General Counseling: Anatasia verbalizes understanding of the findings of todays visit and agrees with plan of treatment. I have discussed any further diagnostic evaluation that may be needed or ordered today. We also reviewed her medications today. she has been encouraged to call the office with any questions or concerns that should arise related to todays visit.    Counseling:    Orders Placed This Encounter  Procedures   CBC w/Diff/Platelet   Comprehensive metabolic panel   TSH + free T4   Lipid  Panel With LDL/HDL Ratio   VITAMIN D 25 Hydroxy (Vit-D Deficiency, Fractures)   Fe+TIBC+Fer   B12 and Folate Panel    No orders of the defined types were placed in this encounter.   Time spent:30 Minutes

## 2023-02-15 ENCOUNTER — Other Ambulatory Visit: Payer: Self-pay | Admitting: Physician Assistant

## 2023-02-15 ENCOUNTER — Telehealth: Payer: Self-pay

## 2023-02-15 DIAGNOSIS — D696 Thrombocytopenia, unspecified: Secondary | ICD-10-CM

## 2023-02-15 DIAGNOSIS — R233 Spontaneous ecchymoses: Secondary | ICD-10-CM

## 2023-02-15 LAB — COMPREHENSIVE METABOLIC PANEL
ALT: 26 IU/L (ref 0–32)
AST: 23 IU/L (ref 0–40)
Albumin: 4.6 g/dL (ref 3.8–4.9)
Alkaline Phosphatase: 107 IU/L (ref 44–121)
BUN/Creatinine Ratio: 20 (ref 12–28)
BUN: 12 mg/dL (ref 8–27)
Bilirubin Total: 0.5 mg/dL (ref 0.0–1.2)
CO2: 21 mmol/L (ref 20–29)
Calcium: 9.7 mg/dL (ref 8.7–10.3)
Chloride: 101 mmol/L (ref 96–106)
Creatinine, Ser: 0.6 mg/dL (ref 0.57–1.00)
Globulin, Total: 2.9 g/dL (ref 1.5–4.5)
Glucose: 92 mg/dL (ref 70–99)
Potassium: 4.4 mmol/L (ref 3.5–5.2)
Sodium: 142 mmol/L (ref 134–144)
Total Protein: 7.5 g/dL (ref 6.0–8.5)
eGFR: 103 mL/min/{1.73_m2} (ref >59)

## 2023-02-15 LAB — CBC WITH DIFFERENTIAL/PLATELET
Basophils Absolute: 0.1 10*3/uL (ref 0.0–0.2)
Basos: 1 %
EOS (ABSOLUTE): 0.1 10*3/uL (ref 0.0–0.4)
Eos: 1 %
Hematocrit: 48.4 % — ABNORMAL HIGH (ref 34.0–46.6)
Hemoglobin: 16 g/dL — ABNORMAL HIGH (ref 11.1–15.9)
Immature Grans (Abs): 0 10*3/uL (ref 0.0–0.1)
Immature Granulocytes: 0 %
Lymphocytes Absolute: 3.1 10*3/uL (ref 0.7–3.1)
Lymphs: 30 %
MCH: 30 pg (ref 26.6–33.0)
MCHC: 33.1 g/dL (ref 31.5–35.7)
MCV: 91 fL (ref 79–97)
Monocytes Absolute: 0.8 10*3/uL (ref 0.1–0.9)
Monocytes: 8 %
Neutrophils Absolute: 6.1 10*3/uL (ref 1.4–7.0)
Neutrophils: 60 %
Platelets: 114 10*3/uL — ABNORMAL LOW (ref 150–450)
RBC: 5.34 x10E6/uL — ABNORMAL HIGH (ref 3.77–5.28)
RDW: 12.8 % (ref 11.7–15.4)
WBC: 10.1 10*3/uL (ref 3.4–10.8)

## 2023-02-15 LAB — LIPID PANEL WITH LDL/HDL RATIO
Cholesterol, Total: 154 mg/dL (ref 100–199)
HDL: 60 mg/dL (ref >39)
LDL Chol Calc (NIH): 69 mg/dL (ref 0–99)
LDL/HDL Ratio: 1.2 ratio (ref 0.0–3.2)
Triglycerides: 147 mg/dL (ref 0–149)
VLDL Cholesterol Cal: 25 mg/dL (ref 5–40)

## 2023-02-15 LAB — IRON,TIBC AND FERRITIN PANEL
Ferritin: 133 ng/mL (ref 15–150)
Iron Saturation: 26 % (ref 15–55)
Iron: 80 ug/dL (ref 27–159)
Total Iron Binding Capacity: 312 ug/dL (ref 250–450)
UIBC: 232 ug/dL (ref 131–425)

## 2023-02-15 LAB — B12 AND FOLATE PANEL
Folate: 7.3 ng/mL (ref >3.0)
Vitamin B-12: 796 pg/mL (ref 232–1245)

## 2023-02-15 LAB — VITAMIN D 25 HYDROXY (VIT D DEFICIENCY, FRACTURES): Vit D, 25-Hydroxy: 63.5 ng/mL (ref 30.0–100.0)

## 2023-02-15 LAB — TSH+FREE T4
Free T4: 1.4 ng/dL (ref 0.82–1.77)
TSH: 0.837 u[IU]/mL (ref 0.450–4.500)

## 2023-02-15 NOTE — Telephone Encounter (Signed)
-----   Message from Carlean Jews sent at 02/15/2023 11:42 AM EDT ----- Please let her know that her platelets are low. They are not at a critical level at this time, but I do not want them to continue to drop and are very reduced compared to her previous labs. Her other labs looked good. Given this low platelet finding we can continue to monitor and recheck this number again in a week, otherwise we could go ahead and put in a hematology referral to start this process in case this number continues to drop as she does already have bruising.

## 2023-02-15 NOTE — Telephone Encounter (Signed)
Pt advised for labs result and lauren will placed referral for hematology for further treatment

## 2023-02-17 ENCOUNTER — Inpatient Hospital Stay: Payer: 59 | Attending: Internal Medicine | Admitting: Internal Medicine

## 2023-02-17 ENCOUNTER — Inpatient Hospital Stay: Payer: 59

## 2023-02-17 ENCOUNTER — Encounter: Payer: Self-pay | Admitting: Internal Medicine

## 2023-02-17 VITALS — BP 123/89 | HR 77 | Temp 98.5°F | Wt 289.0 lb

## 2023-02-17 DIAGNOSIS — Z8249 Family history of ischemic heart disease and other diseases of the circulatory system: Secondary | ICD-10-CM | POA: Diagnosis not present

## 2023-02-17 DIAGNOSIS — Z833 Family history of diabetes mellitus: Secondary | ICD-10-CM | POA: Insufficient documentation

## 2023-02-17 DIAGNOSIS — Z823 Family history of stroke: Secondary | ICD-10-CM | POA: Insufficient documentation

## 2023-02-17 DIAGNOSIS — E785 Hyperlipidemia, unspecified: Secondary | ICD-10-CM | POA: Insufficient documentation

## 2023-02-17 DIAGNOSIS — Z818 Family history of other mental and behavioral disorders: Secondary | ICD-10-CM | POA: Diagnosis not present

## 2023-02-17 DIAGNOSIS — R233 Spontaneous ecchymoses: Secondary | ICD-10-CM | POA: Diagnosis not present

## 2023-02-17 DIAGNOSIS — M25431 Effusion, right wrist: Secondary | ICD-10-CM | POA: Diagnosis not present

## 2023-02-17 DIAGNOSIS — I10 Essential (primary) hypertension: Secondary | ICD-10-CM | POA: Diagnosis not present

## 2023-02-17 DIAGNOSIS — Z79899 Other long term (current) drug therapy: Secondary | ICD-10-CM | POA: Diagnosis not present

## 2023-02-17 DIAGNOSIS — Z803 Family history of malignant neoplasm of breast: Secondary | ICD-10-CM | POA: Diagnosis not present

## 2023-02-17 DIAGNOSIS — Z825 Family history of asthma and other chronic lower respiratory diseases: Secondary | ICD-10-CM | POA: Diagnosis not present

## 2023-02-17 DIAGNOSIS — Z809 Family history of malignant neoplasm, unspecified: Secondary | ICD-10-CM | POA: Diagnosis not present

## 2023-02-17 DIAGNOSIS — D696 Thrombocytopenia, unspecified: Secondary | ICD-10-CM | POA: Insufficient documentation

## 2023-02-17 LAB — CBC WITH DIFFERENTIAL/PLATELET
Abs Immature Granulocytes: 0.03 10*3/uL (ref 0.00–0.07)
Basophils Absolute: 0.1 10*3/uL (ref 0.0–0.1)
Basophils Relative: 1 %
Eosinophils Absolute: 0.1 10*3/uL (ref 0.0–0.5)
Eosinophils Relative: 1 %
HCT: 46.3 % — ABNORMAL HIGH (ref 36.0–46.0)
Hemoglobin: 15.6 g/dL — ABNORMAL HIGH (ref 12.0–15.0)
Immature Granulocytes: 0 %
Lymphocytes Relative: 31 %
Lymphs Abs: 2.9 10*3/uL (ref 0.7–4.0)
MCH: 30.5 pg (ref 26.0–34.0)
MCHC: 33.7 g/dL (ref 30.0–36.0)
MCV: 90.6 fL (ref 80.0–100.0)
Monocytes Absolute: 0.7 10*3/uL (ref 0.1–1.0)
Monocytes Relative: 8 %
Neutro Abs: 5.6 10*3/uL (ref 1.7–7.7)
Neutrophils Relative %: 59 %
Platelets: 91 10*3/uL — ABNORMAL LOW (ref 150–400)
RBC: 5.11 MIL/uL (ref 3.87–5.11)
RDW: 12.7 % (ref 11.5–15.5)
WBC: 9.4 10*3/uL (ref 4.0–10.5)
nRBC: 0 % (ref 0.0–0.2)

## 2023-02-17 LAB — PROTIME-INR
INR: 1 (ref 0.8–1.2)
Prothrombin Time: 13 s (ref 11.4–15.2)

## 2023-02-17 LAB — APTT: aPTT: 28 s (ref 24–36)

## 2023-02-17 NOTE — Progress Notes (Signed)
[facility]  [phone] [phone]) [phone]  ID: [name] OB: May 24, 1961  MR#: [id number]  AOZ#:[id number]  Patient Care Team: Alan Ripper as PCP - General (Physician Assistant)  REFERRING PROVIDER: Dr. Lewis Moccasin  REASON FOR REFERRAL: easy bruising  HPI: Fusaye Jurs is a 61 y.o. female with past medical history of hypertension, hyperlipidemia was referred to hematology for easy bruising and petechial rash.  Patient reported spontaneous easy bruising on her bilateral arms with some petechial rash about a week ago.  Denies any gum bleeding, nosebleeding, bleeding in stool or urine.  Has a history of laparoscopic surgery if related to ovary 40 years ago with no bleeding issues.  Denies any family history of easy bleeding.  No children.  Recently her meloxicam dose was increased from 7.5 to 15 mg.  On baby aspirin.  Otherwise no new medications or over-the-counter or herbal supplement. Also noticed mild swelling in the right wrist for about a week.  Labs reviewed from 02/14/2023.  WBC 10.1, hemoglobin 16, platelets 114.  Iron panel normal.  B12 and folate normal.  REVIEW OF SYSTEMS:   ROS  As per HPI. Otherwise, a complete review of systems is negative.  PAST MEDICAL HISTORY: Past Medical History:  Diagnosis Date   GERD (gastroesophageal reflux disease)    Hyperlipidemia    Hypertension    Knee pain    right knee, bone on bone   [place]   Osteoarthritis of both knees    Shingles 12/14/2014   TMJ (dislocation of temporomandibular joint)     PAST SURGICAL HISTORY: Past Surgical History:  Procedure Laterality Date   COLONOSCOPY     COLONOSCOPY WITH PROPOFOL N/A 02/15/2020   Procedure: COLONOSCOPY WITH PROPOFOL;  Surgeon: [name], MD;  Location: [facility];  Service: Endoscopy;  Laterality: N/A;  priority 4   ESOPHAGOGASTRODUODENOSCOPY (EGD) WITH PROPOFOL N/A 09/18/2015    Procedure: ESOPHAGOGASTRODUODENOSCOPY (EGD) WITH PROPOFOL with ballon dilatation;  Surgeon: [name], MD;  Location: [facility];  Service: Endoscopy;  Laterality: N/A;   LAPAROSCOPIC OVARIAN      FAMILY HISTORY: Family History  Problem Relation Age of Onset   Dementia Mother    Aortic aneurysm Mother    COPD Father    Breast cancer Sister 64   Diabetes Sister    COPD Brother    Cancer Brother    Stroke Sister     HEALTH MAINTENANCE: Social History   Tobacco Use   Smoking status: Never   Smokeless tobacco: Never  Vaping Use   Vaping status: Never Used  Substance Use Topics   Alcohol use: No   Drug use: No     Allergies  Allergen Reactions   Latex Hives and Rash   Tape Rash    Some bandaids cause rash    Current Outpatient Medications  Medication Sig Dispense Refill   aspirin 81 MG tablet Take 81 mg by mouth daily.     atorvastatin (LIPITOR) 20 MG tablet Take 1 tablet (20 mg total) by mouth daily. pm 90 tablet 1   cholecalciferol (VITAMIN D) 1000 UNITS tablet Take 2,000 Units by mouth daily.     cyclobenzaprine (FLEXERIL) 5 MG tablet Take 1 tablet (5 mg total) by mouth 3 (three) times daily as needed for muscle spasms. 90 tablet 1   fluticasone (FLONASE) 50 MCG/ACT nasal spray Use 2 spray(s) in each nostril once daily 48 g 1   magnesium  oxide (MAG-OX) 400 MG tablet Take 400 mg by mouth daily.     meloxicam (MOBIC) 7.5 MG tablet Take 1 tablet (7.5 mg total) by mouth daily. 90 tablet 0   metoprolol succinate (TOPROL-XL) 25 MG 24 hr tablet Take 2 tablets by mouth once daily 180 tablet 0   Multiple Vitamins-Minerals (MULTIVITAL PO) Take 1 tablet by mouth daily.     pantoprazole (PROTONIX) 40 MG tablet Take 1 tablet (40 mg total) by mouth 2 (two) times daily. 60 tablet 3   promethazine (PHENERGAN) 12.5 MG tablet TAKE 1 TABLET BY MOUTH EVERY 8 HOURS AS NEEDED FOR NAUSEA AND VOMITING 20 tablet 1   triamcinolone cream (KENALOG) 0.1 % Apply 1 application. topically  2 (two) times daily as needed (itching and rash). 45 g 2   triamterene-hydrochlorothiazide (MAXZIDE-25) 37.5-25 MG tablet TAKE 1 TABLET BY MOUTH ONCE DAILY. 90 tablet 1   No current facility-administered medications for this visit.    OBJECTIVE: Vitals:   02/17/23 1437  BP: 123/89  Pulse: 77  Temp: 98.5 F (36.9 C)  SpO2: 97%     Body mass index is 51.19 kg/m.      General: Well-developed, well-nourished, no acute distress. Eyes: Pink conjunctiva, anicteric sclera. HEENT: Normocephalic, moist mucous membranes, clear oropharnyx. Lungs: Clear to auscultation bilaterally. Heart: Regular rate and rhythm. No rubs, murmurs, or gallops. Abdomen: Soft, nontender, nondistended. No organomegaly noted, normoactive bowel sounds. Musculoskeletal: No edema, cyanosis, or clubbing. Neuro: Alert, answering all questions appropriately. Cranial nerves grossly intact. Skin: No rashes or petechiae noted. Psych: Normal affect. Lymphatics: No cervical, calvicular, axillary or inguinal LAD.   LAB RESULTS:  Lab Results  Component Value Date   NA 142 02/14/2023   K 4.4 02/14/2023   CL 101 02/14/2023   CO2 21 02/14/2023   GLUCOSE 92 02/14/2023   BUN 12 02/14/2023   CREATININE 0.60 02/14/2023   CALCIUM 9.7 02/14/2023   PROT 7.5 02/14/2023   ALBUMIN 4.6 02/14/2023   AST 23 02/14/2023   ALT 26 02/14/2023   ALKPHOS 107 02/14/2023   BILITOT 0.5 02/14/2023   GFRNONAA 106 10/12/2019   GFRAA 122 10/12/2019    Lab Results  Component Value Date   WBC 10.1 02/14/2023   NEUTROABS 6.1 02/14/2023   HGB 16.0 (H) 02/14/2023   HCT 48.4 (H) 02/14/2023   MCV 91 02/14/2023   PLT 114 (L) 02/14/2023    Lab Results  Component Value Date   TIBC 312 02/14/2023   TIBC 273 08/09/2019   FERRITIN 133 02/14/2023   FERRITIN 168 (H) 08/09/2019   IRONPCTSAT 26 02/14/2023   IRONPCTSAT 40 08/09/2019     STUDIES: No results found.  ASSESSMENT AND PLAN:   Geriyah Gaccione is a 62 y.o. female with  pmh of hypertension, hyperlipidemia was referred to hematology for easy bruising and petechial rash.  # Spontaneous easy bruising # Thrombocytopenia - Patient reported spontaneous easy bruising on her bilateral arms with some petechial rash about a week ago.  Denies any gum bleeding, nosebleeding, bleeding in stool or urine.  Has a history of laparoscopic surgery if related to ovary 40 years ago with no bleeding issues.  Denies any family history of easy bleeding.    - Recently her meloxicam dose was increased from 7.5 to 15 mg.  On baby aspirin.  Otherwise no new medications or over-the-counter or herbal supplement.  Her meloxicam dose has reduced to 7.5 mg again by her primary.  -Vitamin B12 and folate was normal.  Iron panel normal.  Will obtain ANA level.  Patient denies any risk factors for hepatitis HIV and would like to hold off on further testing.  I did discuss about ultrasound of the abdomen to assess liver and spleen for thrombocytopenia.  Patient would like to start with the lab work first.  Platelets of 114 should not cause easy bruising necessarily.  I will repeat CBC with differential today.  Obtain coags and von Willebrand panel.  I also discussed about platelet function test but that will require stopping NSAID and aspirin for 1 week prior to testing.  Orders Placed This Encounter  Procedures   CBC with Differential/Platelet   Protime-INR   APTT   Von Willebrand panel   ANA w/Reflex   RTC in 3 weeks for MD visit to discuss labs.  Patient not available to come sooner at this time.  Patient expressed understanding and was in agreement with this plan. She also understands that She can call clinic at any time with any questions, concerns, or complaints.   I spent a total of 45 minutes reviewing chart data, face-to-face evaluation with the patient, counseling and coordination of care as detailed above.  Michaelyn Barter, MD   02/17/2023 2:44 PM

## 2023-02-18 LAB — ANA W/REFLEX: Anti Nuclear Antibody (ANA): NEGATIVE

## 2023-02-19 LAB — COAG STUDIES INTERP REPORT

## 2023-02-19 LAB — VON WILLEBRAND PANEL
Coagulation Factor VIII: 136 % (ref 56–140)
Ristocetin Co-factor, Plasma: 159 % (ref 50–200)
Von Willebrand Antigen, Plasma: 172 % (ref 50–200)

## 2023-02-22 ENCOUNTER — Encounter: Payer: Self-pay | Admitting: Internal Medicine

## 2023-02-22 NOTE — Telephone Encounter (Signed)
Spoke with pt she need to call Hematology

## 2023-02-23 ENCOUNTER — Other Ambulatory Visit: Payer: Self-pay | Admitting: *Deleted

## 2023-02-23 ENCOUNTER — Other Ambulatory Visit: Payer: Self-pay | Admitting: Internal Medicine

## 2023-02-23 DIAGNOSIS — D696 Thrombocytopenia, unspecified: Secondary | ICD-10-CM

## 2023-03-01 ENCOUNTER — Other Ambulatory Visit: Payer: 59

## 2023-03-01 ENCOUNTER — Ambulatory Visit: Payer: 59 | Admitting: Internal Medicine

## 2023-03-03 ENCOUNTER — Encounter: Payer: 59 | Admitting: Physician Assistant

## 2023-03-04 ENCOUNTER — Inpatient Hospital Stay: Payer: 59

## 2023-03-04 DIAGNOSIS — R233 Spontaneous ecchymoses: Secondary | ICD-10-CM | POA: Diagnosis not present

## 2023-03-04 DIAGNOSIS — D696 Thrombocytopenia, unspecified: Secondary | ICD-10-CM

## 2023-03-04 LAB — HIV ANTIBODY (ROUTINE TESTING W REFLEX): HIV Screen 4th Generation wRfx: NONREACTIVE

## 2023-03-04 LAB — HEPATITIS B CORE ANTIBODY, IGM: Hep B C IgM: NONREACTIVE

## 2023-03-04 LAB — PLATELET FUNCTION ASSAY: Collagen / Epinephrine: 150 s (ref 0–193)

## 2023-03-04 LAB — HEPATITIS B SURFACE ANTIGEN: Hepatitis B Surface Ag: NONREACTIVE

## 2023-03-04 LAB — HEPATITIS C ANTIBODY: HCV Ab: NONREACTIVE

## 2023-03-07 ENCOUNTER — Telehealth: Payer: Self-pay | Admitting: Physician Assistant

## 2023-03-07 NOTE — Telephone Encounter (Signed)
Left vm and sent mychart message to confirm 03/14/23 appointment-Toni

## 2023-03-08 ENCOUNTER — Ambulatory Visit: Payer: 59 | Admitting: Internal Medicine

## 2023-03-08 ENCOUNTER — Other Ambulatory Visit: Payer: Self-pay | Admitting: Physician Assistant

## 2023-03-08 DIAGNOSIS — M170 Bilateral primary osteoarthritis of knee: Secondary | ICD-10-CM

## 2023-03-08 LAB — MULTIPLE MYELOMA PANEL, SERUM
Albumin SerPl Elph-Mcnc: 3.8 g/dL (ref 2.9–4.4)
Albumin/Glob SerPl: 1.3 (ref 0.7–1.7)
Alpha 1: 0.2 g/dL (ref 0.0–0.4)
Alpha2 Glob SerPl Elph-Mcnc: 0.8 g/dL (ref 0.4–1.0)
B-Globulin SerPl Elph-Mcnc: 1.1 g/dL (ref 0.7–1.3)
Gamma Glob SerPl Elph-Mcnc: 0.9 g/dL (ref 0.4–1.8)
Globulin, Total: 3.1 g/dL (ref 2.2–3.9)
IgA: 329 mg/dL (ref 87–352)
IgG (Immunoglobin G), Serum: 963 mg/dL (ref 586–1602)
IgM (Immunoglobulin M), Srm: 73 mg/dL (ref 26–217)
Total Protein ELP: 6.9 g/dL (ref 6.0–8.5)

## 2023-03-09 ENCOUNTER — Ambulatory Visit
Admission: RE | Admit: 2023-03-09 | Discharge: 2023-03-09 | Disposition: A | Discharge status: Home / Self Care | Payer: 59 | Source: Ambulatory Visit | Attending: Internal Medicine | Admitting: Internal Medicine

## 2023-03-09 DIAGNOSIS — D696 Thrombocytopenia, unspecified: Secondary | ICD-10-CM | POA: Insufficient documentation

## 2023-03-14 ENCOUNTER — Ambulatory Visit: Payer: 59 | Admitting: Internal Medicine

## 2023-03-14 ENCOUNTER — Ambulatory Visit (INDEPENDENT_AMBULATORY_CARE_PROVIDER_SITE_OTHER): Payer: 59 | Admitting: Physician Assistant

## 2023-03-14 ENCOUNTER — Inpatient Hospital Stay: Payer: 59 | Attending: Internal Medicine | Admitting: Internal Medicine

## 2023-03-14 ENCOUNTER — Encounter: Payer: Self-pay | Admitting: Physician Assistant

## 2023-03-14 VITALS — BP 118/74 | HR 80 | Temp 98.9°F | Wt 283.0 lb

## 2023-03-14 VITALS — BP 120/70 | HR 77 | Temp 98.2°F | Resp 16 | Ht 63.0 in | Wt 284.2 lb

## 2023-03-14 DIAGNOSIS — Z825 Family history of asthma and other chronic lower respiratory diseases: Secondary | ICD-10-CM | POA: Insufficient documentation

## 2023-03-14 DIAGNOSIS — I10 Essential (primary) hypertension: Secondary | ICD-10-CM | POA: Insufficient documentation

## 2023-03-14 DIAGNOSIS — M25431 Effusion, right wrist: Secondary | ICD-10-CM | POA: Insufficient documentation

## 2023-03-14 DIAGNOSIS — Z809 Family history of malignant neoplasm, unspecified: Secondary | ICD-10-CM | POA: Diagnosis not present

## 2023-03-14 DIAGNOSIS — K8020 Calculus of gallbladder without cholecystitis without obstruction: Secondary | ICD-10-CM | POA: Insufficient documentation

## 2023-03-14 DIAGNOSIS — D696 Thrombocytopenia, unspecified: Secondary | ICD-10-CM | POA: Insufficient documentation

## 2023-03-14 DIAGNOSIS — K068 Other specified disorders of gingiva and edentulous alveolar ridge: Secondary | ICD-10-CM | POA: Diagnosis not present

## 2023-03-14 DIAGNOSIS — K219 Gastro-esophageal reflux disease without esophagitis: Secondary | ICD-10-CM | POA: Diagnosis not present

## 2023-03-14 DIAGNOSIS — Z0001 Encounter for general adult medical examination with abnormal findings: Secondary | ICD-10-CM | POA: Diagnosis not present

## 2023-03-14 DIAGNOSIS — Z803 Family history of malignant neoplasm of breast: Secondary | ICD-10-CM | POA: Diagnosis not present

## 2023-03-14 DIAGNOSIS — Z79899 Other long term (current) drug therapy: Secondary | ICD-10-CM | POA: Diagnosis not present

## 2023-03-14 DIAGNOSIS — Z823 Family history of stroke: Secondary | ICD-10-CM | POA: Diagnosis not present

## 2023-03-14 DIAGNOSIS — Z818 Family history of other mental and behavioral disorders: Secondary | ICD-10-CM | POA: Diagnosis not present

## 2023-03-14 DIAGNOSIS — D693 Immune thrombocytopenic purpura: Secondary | ICD-10-CM

## 2023-03-14 DIAGNOSIS — R040 Epistaxis: Secondary | ICD-10-CM | POA: Diagnosis not present

## 2023-03-14 DIAGNOSIS — Z833 Family history of diabetes mellitus: Secondary | ICD-10-CM | POA: Insufficient documentation

## 2023-03-14 DIAGNOSIS — R233 Spontaneous ecchymoses: Secondary | ICD-10-CM

## 2023-03-14 DIAGNOSIS — Z6841 Body Mass Index (BMI) 40.0 and over, adult: Secondary | ICD-10-CM

## 2023-03-14 NOTE — Progress Notes (Signed)
[facility] [address] [place], [place] [address]  Internal MEDICINE  Office Visit Note  Patient Name: Mandy Baldwin  [id number]  [id number]  Date of Service: 03/23/2023  Chief Complaint  Patient presents with   Annual Exam   Gastroesophageal Reflux   Hyperlipidemia   Hypertension     HPI Pt is here for routine health maintenance examination -Seeing hematology now for low platelets, had an [place] as well. Seeing them today for results -down 5lbs -did trip coming out of [place] and right foot caught lip of door and landed on left knee and elbow. Did not pass out or hit head.  -going today for flu and covid vaccines -RSV on November 29, 2022 -labs reviewed: low platelets already addressed with referral to hematology. Hemoglobin a little elevated, but improving on repeat labs with hematology monitoring. Cholesterol improved.  Current Medication: Outpatient Encounter Medications as of 03/14/2023  Medication Sig   aspirin 81 MG tablet Take 81 mg by mouth daily.   atorvastatin (LIPITOR) 20 MG tablet Take 1 tablet (20 mg total) by mouth daily. pm (Patient taking differently: Take 40 mg by mouth daily. pm)   cholecalciferol (VITAMIN D) 1000 UNITS tablet Take 2,000 Units by mouth daily.   cyclobenzaprine (FLEXERIL) 5 MG tablet Take 1 tablet (5 mg total) by mouth 3 (three) times daily as needed for muscle spasms.   fluticasone (FLONASE) 50 MCG/ACT nasal spray Use 2 spray(s) in each nostril once daily   magnesium oxide (MAG-OX) 400 MG tablet Take 400 mg by mouth daily.   meloxicam (MOBIC) 7.5 MG tablet Take 1 tablet by mouth once daily   metoprolol succinate (TOPROL-XL) 25 MG 24 hr tablet Take 2 tablets by mouth once daily   Multiple Vitamins-Minerals (MULTIVITAL PO) Take 1 tablet by mouth daily.   pantoprazole (PROTONIX) 40 MG tablet Take 1 tablet (40 mg total) by mouth 2 (two) times daily.   promethazine (PHENERGAN) 12.5 MG tablet TAKE 1 TABLET BY MOUTH EVERY 8 HOURS AS  NEEDED FOR NAUSEA AND VOMITING   triamcinolone cream (KENALOG) 0.1 % Apply 1 application. topically 2 (two) times daily as needed (itching and rash).   triamterene-hydrochlorothiazide (MAXZIDE-25) 37.5-25 MG tablet TAKE 1 TABLET BY MOUTH ONCE DAILY.   vitamin k 100 MCG tablet Take 100 mcg by mouth daily.   No facility-administered encounter medications on file as of 03/14/2023.    Surgical History: Past Surgical History:  Procedure Laterality Date   COLONOSCOPY     COLONOSCOPY WITH PROPOFOL N/A 02/15/2020   Procedure: COLONOSCOPY WITH PROPOFOL;  Surgeon: [name], MD;  Location: [facility];  Service: Endoscopy;  Laterality: N/A;  priority 4   ESOPHAGOGASTRODUODENOSCOPY (EGD) WITH PROPOFOL N/A 09/18/2015   Procedure: ESOPHAGOGASTRODUODENOSCOPY (EGD) WITH PROPOFOL with ballon dilatation;  Surgeon: [name], MD;  Location: [facility];  Service: Endoscopy;  Laterality: N/A;   LAPAROSCOPIC OVARIAN      Medical History: Past Medical History:  Diagnosis Date   GERD (gastroesophageal reflux disease)    Hyperlipidemia    Hypertension    Knee pain    right knee, bone on bone   Motion sickness    [place]   Osteoarthritis of both knees    Shingles 12/14/2014   TMJ (dislocation of temporomandibular joint)     Family History: Family History  Problem Relation Age of Onset   Dementia Mother    Aortic aneurysm Mother    COPD Father    Breast cancer Sister 48  Diabetes Sister    COPD Brother    Cancer Brother    Stroke Sister       Review of Systems  Constitutional:  Negative for fatigue and fever.  HENT:  Negative for congestion, mouth sores and postnasal drip.   Respiratory:  Negative for cough.   Cardiovascular:  Negative for chest pain.  Gastrointestinal:  Negative for anal bleeding and blood in stool.  Genitourinary:  Negative for flank pain and hematuria.  Musculoskeletal:  Positive for arthralgias.  Neurological:  Negative for weakness.   Hematological:  Bruises/bleeds easily.  Psychiatric/Behavioral: Negative.       Vital Signs: BP 120/70   Pulse 77   Temp 98.2 F (36.8 C)   Resp 16   Ht 5\' 3"  (1.6 m)   Wt 284 lb 3.2 oz (128.9 kg)   SpO2 98%   BMI 50.34 kg/m    Physical Exam Vitals and nursing note reviewed.  Constitutional:      General: She is not in acute distress.    Appearance: Normal appearance. She is well-developed. She is obese. She is not diaphoretic.  HENT:     Head: Normocephalic and atraumatic.     Mouth/Throat:     Pharynx: No oropharyngeal exudate.  Eyes:     Pupils: Pupils are equal, round, and reactive to light.  Neck:     Thyroid: No thyromegaly.     Vascular: No JVD.     Trachea: No tracheal deviation.  Cardiovascular:     Rate and Rhythm: Normal rate and regular rhythm.     Heart sounds: Normal heart sounds. No murmur heard.    No friction rub. No gallop.  Pulmonary:     Effort: Pulmonary effort is normal. No respiratory distress.     Breath sounds: No wheezing or rales.  Chest:     Chest wall: No tenderness.  Abdominal:     General: Bowel sounds are normal.     Palpations: Abdomen is soft.     Tenderness: There is no abdominal tenderness.  Musculoskeletal:        General: Normal range of motion.     Cervical back: Normal range of motion and neck supple.  Lymphadenopathy:     Cervical: No cervical adenopathy.  Skin:    General: Skin is warm and dry.     Findings: Bruising present.  Neurological:     Mental Status: She is alert and oriented to person, place, and time.     Cranial Nerves: No cranial nerve deficit.  Psychiatric:        Behavior: Behavior normal.        Thought Content: Thought content normal.        Judgment: Judgment normal.      LABS: Recent Results (from the past 2160 hour(s))  CBC w/Diff/Platelet     Status: Abnormal   Collection Time: 02/14/23  2:38 PM  Result Value Ref Range   WBC 10.1 3.4 - 10.8 x10E3/uL   RBC 5.34 (H) 3.77 - 5.28  x10E6/uL   Hemoglobin 16.0 (H) 11.1 - 15.9 g/dL   Hematocrit 69.6 (H) 29.5 - 46.6 %   MCV 91 79 - 97 fL   MCH 30.0 26.6 - 33.0 pg   MCHC 33.1 31.5 - 35.7 g/dL   RDW 28.4 13.2 - 44.0 %   Platelets 114 (L) 150 - 450 x10E3/uL   Neutrophils 60 Not Estab. %   Lymphs 30 Not Estab. %   Monocytes 8 Not Estab. %  Eos 1 Not Estab. %   Basos 1 Not Estab. %   Neutrophils Absolute 6.1 1.4 - 7.0 x10E3/uL   Lymphocytes Absolute 3.1 0.7 - 3.1 x10E3/uL   Monocytes Absolute 0.8 0.1 - 0.9 x10E3/uL   EOS (ABSOLUTE) 0.1 0.0 - 0.4 x10E3/uL   Basophils Absolute 0.1 0.0 - 0.2 x10E3/uL   Immature Granulocytes 0 Not Estab. %   Immature Grans (Abs) 0.0 0.0 - 0.1 x10E3/uL  Comprehensive metabolic panel     Status: None   Collection Time: 02/14/23  2:38 PM  Result Value Ref Range   Glucose 92 70 - 99 mg/dL   BUN 12 8 - 27 mg/dL   Creatinine, Ser 1.47 0.57 - 1.00 mg/dL   eGFR 829 >56 OZ/HYQ/6.57   BUN/Creatinine Ratio 20 12 - 28   Sodium 142 134 - 144 mmol/L   Potassium 4.4 3.5 - 5.2 mmol/L   Chloride 101 96 - 106 mmol/L   CO2 21 20 - 29 mmol/L   Calcium 9.7 8.7 - 10.3 mg/dL   Total Protein 7.5 6.0 - 8.5 g/dL   Albumin 4.6 3.8 - 4.9 g/dL   Globulin, Total 2.9 1.5 - 4.5 g/dL   Bilirubin Total 0.5 0.0 - 1.2 mg/dL   Alkaline Phosphatase 107 44 - 121 IU/L   AST 23 0 - 40 IU/L   ALT 26 0 - 32 IU/L  TSH + free T4     Status: None   Collection Time: 02/14/23  2:38 PM  Result Value Ref Range   TSH 0.837 0.450 - 4.500 uIU/mL   Free T4 1.40 0.82 - 1.77 ng/dL  Lipid Panel With LDL/HDL Ratio     Status: None   Collection Time: 02/14/23  2:38 PM  Result Value Ref Range   Cholesterol, Total 154 100 - 199 mg/dL   Triglycerides 846 0 - 149 mg/dL   HDL 60 >96 mg/dL   VLDL Cholesterol Cal 25 5 - 40 mg/dL   LDL Chol Calc (NIH) 69 0 - 99 mg/dL   LDL/HDL Ratio 1.2 0.0 - 3.2 ratio    Comment:                                     LDL/HDL Ratio                                             Men  Women                                1/2 Avg.Risk  1.0    1.5                                   Avg.Risk  3.6    3.2                                2X Avg.Risk  6.2    5.0                                3X Avg.Risk  8.0  6.1   VITAMIN D 25 Hydroxy (Vit-D Deficiency, Fractures)     Status: None   Collection Time: 02/14/23  2:38 PM  Result Value Ref Range   Vit D, 25-Hydroxy 63.5 30.0 - 100.0 ng/mL    Comment: Vitamin D deficiency has been defined by the Institute of Medicine and an Endocrine Society practice guideline as a level of serum 25-OH vitamin D less than 20 ng/mL (1,2). The Endocrine Society went on to further define vitamin D insufficiency as a level between 21 and 29 ng/mL (2). 1. IOM (Institute of Medicine). 2010. Dietary reference    intakes for calcium and D. [place]: The    [employer]. 2. Holick MF, Binkley [place], Bischoff-Ferrari HA, et al.    Evaluation, treatment, and prevention of vitamin D    deficiency: an Endocrine Society clinical practice    guideline. JCEM. 2011 Jul; 96(7):1911-30.   Fe+TIBC+Fer     Status: None   Collection Time: 02/14/23  2:38 PM  Result Value Ref Range   Total Iron Binding Capacity 312 250 - 450 ug/dL   UIBC 161 096 - 045 ug/dL   Iron 80 27 - 409 ug/dL   Iron Saturation 26 15 - 55 %   Ferritin 133 15 - 150 ng/mL  B12 and Folate Panel     Status: None   Collection Time: 02/14/23  2:38 PM  Result Value Ref Range   Vitamin B-12 796 232 - 1,245 pg/mL   Folate 7.3 >3.0 ng/mL    Comment: A serum folate concentration of less than 3.1 ng/mL is considered to represent clinical deficiency.   ANA w/Reflex     Status: None   Collection Time: 02/17/23  3:12 PM  Result Value Ref Range   Anti Nuclear Antibody (ANA) Negative Negative    Comment: (NOTE) Performed At: [facility] [address] [place], [place] [id number] Jolene Schimke MD [id number]   Von Willebrand panel     Status: None   Collection Time: 02/17/23  3:12 PM  Result  Value Ref Range   Coagulation Factor VIII 136 56 - 140 %   Ristocetin Co-factor, Plasma 159 50 - 200 %    Comment: (NOTE) Performed At: [facility] [address] [place], [place] [id number] Jolene Schimke MD [id number]    Von Willebrand Antigen, Plasma 172 50 - 200 %    Comment: (NOTE) This test was developed and its performance characteristics determined by [facility]. It has not been cleared or approved by the Food and Drug Administration.   APTT     Status: None   Collection Time: 02/17/23  3:12 PM  Result Value Ref Range   aPTT 28 24 - 36 seconds    Comment: Performed at [facility], [address] [place], [place] [address]  Protime-INR     Status: None   Collection Time: 02/17/23  3:12 PM  Result Value Ref Range   Prothrombin Time 13.0 11.4 - 15.2 seconds   INR 1.0 0.8 - 1.2    Comment: (NOTE) INR goal varies based on device and disease states. Performed at [facility], [address] [place], [place] [address]   CBC with Differential/Platelet     Status: Abnormal   Collection Time: 02/17/23  3:12 PM  Result Value Ref Range   WBC 9.4 4.0 - 10.5 K/uL   RBC 5.11 3.87 - 5.11 MIL/uL   Hemoglobin 15.6 (H) 12.0 - 15.0 g/dL   HCT 42.5 (H) 95.6 -  46.0 %   MCV 90.6 80.0 - 100.0 fL   MCH 30.5 26.0 - 34.0 pg   MCHC 33.7 30.0 - 36.0 g/dL   RDW 16.1 09.6 - 04.5 %   Platelets 91 (L) 150 - 400 K/uL    Comment: SPECIMEN CHECKED FOR CLOTS PLATELET COUNT CONFIRMED BY SMEAR    nRBC 0.0 0.0 - 0.2 %   Neutrophils Relative % 59 %   Neutro Abs 5.6 1.7 - 7.7 K/uL   Lymphocytes Relative 31 %   Lymphs Abs 2.9 0.7 - 4.0 K/uL   Monocytes Relative 8 %   Monocytes Absolute 0.7 0.1 - 1.0 K/uL   Eosinophils Relative 1 %   Eosinophils Absolute 0.1 0.0 - 0.5 K/uL   Basophils Relative 1 %   Basophils Absolute 0.1 0.0 - 0.1 K/uL   WBC Morphology MORPHOLOGY UNREMARKABLE    RBC Morphology MORPHOLOGY UNREMARKABLE    Smear Review FEW LARGE PLTS NOTED ON SMEAR     Immature Granulocytes 0 %   Abs Immature Granulocytes 0.03 0.00 - 0.07 K/uL    Comment: Performed at [facility], [address] [place], [place] [address]  Coag Studies Interp Report     Status: None   Collection Time: 02/17/23  3:12 PM  Result Value Ref Range   Interpretation Note     Comment: (NOTE) ------------------------------- COAGULATION: VON WILLEBRAND FACTOR ASSESSMENT CURRENT RESULTS ASSESSMENT The VWF:Ag is normal. The VWF:RCo is normal. The FVIII is normal. VON WILLEBRAND FACTOR ASSESSMENT CURRENT RESULTS INTERPRETATION - These results are not consistent with a diagnosis of VWD according to the current NHLBI guideline. VON WILLEBRAND FACTOR ASSESSMENT - Results may be falsely elevated and possibly falsely normal as VWF and FVIII may increase in samples drawn from patients (particularly children) who are visibly stressed at the time of phlebotomy, as acute phase reactants, or in response to certain drug therapies such as desmopressin. Repeat testing may be necessary before excluding a diagnosis of VWD especially if the clinical suspicion is high for an underlying bleeding disorder. The setting for phlebotomy should be as calm as possible and patients should be encouraged to sit quietly prior to the blood draw. VON WILLEBRAND FACTOR ASSESSMENT  DEFINITIONS - VWD - von Willebrand disease; VWF - von Willebrand factor; VWF:Ag - VWF antigen; VWF:RCo - VWF ristocetin cofactor activity; FVIII - factor VIII activity. MEDICAL DIRECTOR: For questions regarding panel interpretation, please contact Lebron Conners, M.D. at [facility] at [phone]. ------------------------------- DISCLAIMER These assessments and interpretations are provided as a convenience in support of the physician-patient relationship and are not intended to replace the physician's clinical judgment. They are derived from national guidelines in addition to other  evidence and expert opinion. The clinician should consider this information within the context of clinical opinion and the individual patient. SEE GUIDANCE FOR VON WILLEBRAND FACTOR ASSESSMENT: (1) The National Heart, Lung and Blood Institute. The Diagnosis, Evaluation and Management of von Willebrand Disease. Lafe Garin, MD: [employer] Publication  [phone]. 2007. Available at http://kemp.com/. (2) Annie Sable et al. Othella Boyer J Hematol. 2009; 84(6):366-370. (3) Laffan M et al. Haemophilia. 2004;10(3):199-217. (4) Pasi KJ et al. Haemophilia. 2004; 10(3):218-231. Performed At: [facility] Clinical / Digital [address] [place], [place] [id number] Blanchie Serve MD [id number]   Platelet function assay     Status: None   Collection Time: 03/04/23 11:36 AM  Result Value Ref Range   PFA Interpretation            Comment: Platelet  function is normal. If patient history/physical examination give strong indication of a bleeding disorder repeat testing for confirmation.        Results of the test should always be interpreted in conjunction with the patient's medical history, clinical presentation and medication history. Patients with Hematocrit values <35.0% or Platelet counts <150,000/uL may result in values above the Laboratory established reference range.    Collagen / Epinephrine 150 0 - 193 seconds    Comment: Performed at [facility], [address], [place], [place] [address]  Hepatitis B core antibody, IgM     Status: None   Collection Time: 03/04/23 11:36 AM  Result Value Ref Range   Hep B C IgM NON REACTIVE NON REACTIVE    Comment: Performed at [facility], [address], [place], [place] [address]  HIV ANTIBODY (ROUTINE TETSING W RELFEX)     Status: None   Collection Time: 03/04/23 11:36 AM  Result Value Ref Range   HIV Screen 4th Generation wRfx Non Reactive Non Reactive    Comment: Performed at [facility], [address] [place], [place] [address]  Hepatitis C antibody     Status: None   Collection Time: 03/04/23 11:36 AM  Result Value Ref Range   HCV Ab NON REACTIVE NON REACTIVE    Comment: (NOTE) Nonreactive HCV antibody screen is consistent with no HCV infections,  unless recent infection is suspected or other evidence exists to indicate HCV infection.  Performed at [facility], [address], [place], [place] [address]   Multiple Myeloma Panel (SPEP&IFE w/QIG)     Status: None   Collection Time: 03/04/23 11:37 AM  Result Value Ref Range   IgG (Immunoglobin G), Serum 963 586 - 1,602 mg/dL   IgA 694 87 - 854 mg/dL   IgM (Immunoglobulin M), Srm 73 26 - 217 mg/dL   Total Protein ELP 6.9 6.0 - 8.5 g/dL   Albumin SerPl Elph-Mcnc 3.8 2.9 - 4.4 g/dL   Alpha 1 0.2 0.0 - 0.4 g/dL   Alpha2 Glob SerPl Elph-Mcnc 0.8 0.4 - 1.0 g/dL   B-Globulin SerPl Elph-Mcnc 1.1 0.7 - 1.3 g/dL   Gamma Glob SerPl Elph-Mcnc 0.9 0.4 - 1.8 g/dL   M Protein SerPl Elph-Mcnc Not Observed Not Observed g/dL   Globulin, Total 3.1 2.2 - 3.9 g/dL   Albumin/Glob SerPl 1.3 0.7 - 1.7   IFE 1 Comment     Comment: (NOTE) The immunofixation pattern appears unremarkable. Evidence of monoclonal protein is not apparent.    Please Note Comment     Comment: (NOTE) Protein electrophoresis scan will follow via computer, mail, or courier delivery. Performed At: [facility] [address] [place], [place] [id number] Jolene Schimke MD FG:[id number]   Hepatitis B surface antigen     Status: None   Collection Time: 03/04/23 11:38 AM  Result Value Ref Range   Hepatitis B Surface Ag NON REACTIVE NON REACTIVE    Comment: Performed at [facility], [address], [place], [place] [address]        Assessment/Plan: 1. Encounter for general adult medical examination with abnormal findings Cpe performed, mammogram UTD, due for pap and will schedule this at next follow up  2. Thrombocytopenia (HCC) Followed by  hematology  3. Essential hypertension Stable, continue current medication  4. Morbid obesity with BMI of 50.0-59.9, adult (HCC) Down 5lbs and will continue to work on diet and exercise   General Counseling: Mandy Baldwin verbalizes  understanding of the findings of todays visit and agrees with plan of treatment. I have discussed any further diagnostic evaluation that may be needed or ordered today. We also reviewed her medications today. she has been encouraged to call the office with any questions or concerns that should arise related to todays visit.    Counseling:    No orders of the defined types were placed in this encounter.   No orders of the defined types were placed in this encounter.   This patient was seen by Lynn Ito, PA-C in collaboration with Dr. Beverely Risen as a part of collaborative care agreement.  Total time spent:35 Minutes  Time spent includes review of chart, medications, test results, and follow up plan with the patient.     Lyndon Code, MD  Internal Medicine

## 2023-03-14 NOTE — Progress Notes (Unsigned)
Can patient take a vitamin K supplement? Patient states that she is not bruising nearly as bad as before.

## 2023-03-15 ENCOUNTER — Encounter: Payer: Self-pay | Admitting: Internal Medicine

## 2023-03-15 DIAGNOSIS — D693 Immune thrombocytopenic purpura: Secondary | ICD-10-CM | POA: Insufficient documentation

## 2023-03-15 NOTE — Progress Notes (Signed)
[facility]  [phone] [phone]) [phone]  ID: Mandy Baldwin OB: 1960/07/04  MR#: [id number]  PPI#:[id number]  Patient Care Team: Alan Ripper as PCP - General (Physician Assistant) Michaelyn Barter, MD as Consulting Physician (Oncology)  REFERRING PROVIDER: Dr. Lewis Moccasin  REASON FOR REFERRAL: easy bruising, thrombocytopenia  HPI: Mandy Baldwin is a 62 y.o. female with past medical history of hypertension, hyperlipidemia was referred to hematology for easy bruising and petechial rash.  Patient reported spontaneous easy bruising on her bilateral arms with some petechial rash about a week ago.  Denies any gum bleeding, nosebleeding, bleeding in stool or urine.  Has a history of laparoscopic surgery if related to ovary 40 years ago with no bleeding issues.  Denies any family history of easy bleeding.  No children.  Recently her meloxicam dose was increased from 7.5 to 15 mg.  On baby aspirin.  Otherwise no new medications or over-the-counter or herbal supplement. Also noticed mild swelling in the right wrist for about a week.  Labs reviewed from 02/14/2023.  WBC 10.1, hemoglobin 16, platelets 114.  Iron panel normal.  B12 and folate normal.  Interval history Patient was seen today as follow-up to discuss labs for thrombocytopenia. Her easy bruising has improved.  Denies any bleeding.  REVIEW OF SYSTEMS:   ROS  As per HPI. Otherwise, a complete review of systems is negative.  PAST MEDICAL HISTORY: Past Medical History:  Diagnosis Date   GERD (gastroesophageal reflux disease)    Hyperlipidemia    Hypertension    Knee pain    right knee, bone on bone   Motion sickness    [place]   Osteoarthritis of both knees    Shingles 12/14/2014   TMJ (dislocation of temporomandibular joint)     PAST SURGICAL HISTORY: Past Surgical History:  Procedure Laterality Date   COLONOSCOPY     COLONOSCOPY WITH PROPOFOL N/A 02/15/2020    Procedure: COLONOSCOPY WITH PROPOFOL;  Surgeon: [name], MD;  Location: [facility];  Service: Endoscopy;  Laterality: N/A;  priority 4   ESOPHAGOGASTRODUODENOSCOPY (EGD) WITH PROPOFOL N/A 09/18/2015   Procedure: ESOPHAGOGASTRODUODENOSCOPY (EGD) WITH PROPOFOL with ballon dilatation;  Surgeon: [name], MD;  Location: [facility];  Service: Endoscopy;  Laterality: N/A;   LAPAROSCOPIC OVARIAN      FAMILY HISTORY: Family History  Problem Relation Age of Onset   Dementia Mother    Aortic aneurysm Mother    COPD Father    Breast cancer Sister 61   Diabetes Sister    COPD Brother    Cancer Brother    Stroke Sister     HEALTH MAINTENANCE: Social History   Tobacco Use   Smoking status: Never   Smokeless tobacco: Never  Vaping Use   Vaping status: Never Used  Substance Use Topics   Alcohol use: No   Drug use: No     Allergies  Allergen Reactions   Latex Hives and Rash   Tape Rash    Some bandaids cause rash    Current Outpatient Medications  Medication Sig Dispense Refill   aspirin 81 MG tablet Take 81 mg by mouth daily.     atorvastatin (LIPITOR) 20 MG tablet Take 1 tablet (20 mg total) by mouth daily. pm (Patient taking differently: Take 40 mg by mouth daily. pm) 90 tablet 1   cholecalciferol (VITAMIN D) 1000 UNITS tablet Take 2,000 Units by mouth daily.     cyclobenzaprine (FLEXERIL) 5 MG tablet Take  1 tablet (5 mg total) by mouth 3 (three) times daily as needed for muscle spasms. 90 tablet 1   fluticasone (FLONASE) 50 MCG/ACT nasal spray Use 2 spray(s) in each nostril once daily 48 g 1   magnesium oxide (MAG-OX) 400 MG tablet Take 400 mg by mouth daily.     meloxicam (MOBIC) 7.5 MG tablet Take 1 tablet by mouth once daily 90 tablet 0   metoprolol succinate (TOPROL-XL) 25 MG 24 hr tablet Take 2 tablets by mouth once daily 180 tablet 0   Multiple Vitamins-Minerals (MULTIVITAL PO) Take 1 tablet by mouth daily.     pantoprazole (PROTONIX) 40 MG  tablet Take 1 tablet (40 mg total) by mouth 2 (two) times daily. 60 tablet 3   promethazine (PHENERGAN) 12.5 MG tablet TAKE 1 TABLET BY MOUTH EVERY 8 HOURS AS NEEDED FOR NAUSEA AND VOMITING 20 tablet 1   triamcinolone cream (KENALOG) 0.1 % Apply 1 application. topically 2 (two) times daily as needed (itching and rash). 45 g 2   triamterene-hydrochlorothiazide (MAXZIDE-25) 37.5-25 MG tablet TAKE 1 TABLET BY MOUTH ONCE DAILY. 90 tablet 1   vitamin k 100 MCG tablet Take 100 mcg by mouth daily.     No current facility-administered medications for this visit.    OBJECTIVE: Vitals:   03/14/23 1504  BP: 118/74  Pulse: 80  Temp: 98.9 F (37.2 C)  SpO2: 97%     Body mass index is 50.13 kg/m.      General: Well-developed, well-nourished, no acute distress. Eyes: Pink conjunctiva, anicteric sclera. HEENT: Normocephalic, moist mucous membranes, clear oropharnyx. Lungs: Clear to auscultation bilaterally. Heart: Regular rate and rhythm. No rubs, murmurs, or gallops. Abdomen: Soft, nontender, nondistended. No organomegaly noted, normoactive bowel sounds. Musculoskeletal: No edema, cyanosis, or clubbing. Neuro: Alert, answering all questions appropriately. Cranial nerves grossly intact. Skin: No rashes or petechiae noted. Psych: Normal affect. Lymphatics: No cervical, calvicular, axillary or inguinal LAD.   LAB RESULTS:  Lab Results  Component Value Date   NA 142 02/14/2023   K 4.4 02/14/2023   CL 101 02/14/2023   CO2 21 02/14/2023   GLUCOSE 92 02/14/2023   BUN 12 02/14/2023   CREATININE 0.60 02/14/2023   CALCIUM 9.7 02/14/2023   PROT 7.5 02/14/2023   ALBUMIN 4.6 02/14/2023   AST 23 02/14/2023   ALT 26 02/14/2023   ALKPHOS 107 02/14/2023   BILITOT 0.5 02/14/2023   GFRNONAA 106 10/12/2019   GFRAA 122 10/12/2019    Lab Results  Component Value Date   WBC 9.4 02/17/2023   NEUTROABS 5.6 02/17/2023   HGB 15.6 (H) 02/17/2023   HCT 46.3 (H) 02/17/2023   MCV 90.6 02/17/2023    PLT 91 (L) 02/17/2023    Lab Results  Component Value Date   TIBC 312 02/14/2023   TIBC 273 08/09/2019   FERRITIN 133 02/14/2023   FERRITIN 168 (H) 08/09/2019   IRONPCTSAT 26 02/14/2023   IRONPCTSAT 40 08/09/2019     STUDIES: US Abdomen Complete  Result Date: 03/14/2023 CLINICAL DATA:  Thrombocytopenia EXAM: ABDOMEN ULTRASOUND COMPLETE COMPARISON:  None Available. FINDINGS: Gallbladder: No wall thickening visualized. Gallstones, largest measures 2.1 cm. No sonographic Murphy sign noted by sonographer. Common bile duct: Diameter: 2.5 mm Liver: No focal lesion identified. Increased parenchymal echogenicity. Portal vein is patent on color Doppler imaging with normal direction of blood flow towards the liver. IVC: No abnormality visualized. Pancreas: Visualized portion unremarkable. Spleen: Length: 10.6.  Size and appearance within normal limits. Right Kidney: Length: 10.3.  Echogenicity within normal limits. No mass or hydronephrosis visualized. Left Kidney: Length: 11.5. Echogenicity within normal limits. No mass or hydronephrosis visualized. Abdominal aorta: No aneurysm visualized. Other findings: None. IMPRESSION: 1. Hepatic steatosis. 2. Gallstones. Electronically Signed   By: Allegra Lai M.D.   On: 03/14/2023 15:25    ASSESSMENT AND PLAN:   Mandy Baldwin is a 62 y.o. female with pmh of hypertension, hyperlipidemia was referred to hematology for easy bruising and petechial rash.  # Thrombocytopenia -Patient had normal platelet count on 05/26/2022.  Labs from 02/14/2023 showed platelets of 114.  Repeat was 91.  Hemoglobin and WBC is normal. -Workup so far-vitamin B12, folate normal.  Iron panel normal.  ANA negative, hepatitis B/C/HIV nonreactive.  SPEP/IFE no M protein.  Ultrasound abdomen showed fatty liver, gallstone, normal spleen.  Medication list reviewed no offending agents identified.  -Discussed with the patient about the negative workup.  Her low platelets could be likely  ITP.  Discussed about the diagnosis.  Does not need treatment unless platelets go below 30,000 or has evidence of bleeding.  Patient was made aware about red flags such as gum bleeding, nosebleeding, bleeding in urine or stools, petechial rash.  # Spontaneous easy bruising -Unlikely to be explained by thrombocytopenia since platelets are still above 50,000.  -PT/INR and APTT normal.  Von Willebrand panel normal.  Platelet function test done after stopping aspirin and Mobic for a week was normal.  Test can be affected since platelet were less than 150K.  Currently her bruising has improved.  Will continue to monitor.  Her coags are normal so I do not see a strong indication to start vitamin K.  Advised to consume green leafy vegetables.   Orders Placed This Encounter  Procedures   CBC with Differential/Platelet   Comprehensive metabolic panel   RTC in 6 months for MD visit, labs  Patient expressed understanding and was in agreement with this plan. She also understands that She can call clinic at any time with any questions, concerns, or complaints.   I spent a total of 30 minutes reviewing chart data, face-to-face evaluation with the patient, counseling and coordination of care as detailed above.  Michaelyn Barter, MD   03/15/2023 1:33 PM

## 2023-04-19 ENCOUNTER — Other Ambulatory Visit: Payer: Self-pay | Admitting: Physician Assistant

## 2023-04-19 DIAGNOSIS — I10 Essential (primary) hypertension: Secondary | ICD-10-CM

## 2023-05-03 ENCOUNTER — Other Ambulatory Visit: Payer: Self-pay | Admitting: Physician Assistant

## 2023-05-03 DIAGNOSIS — K2100 Gastro-esophageal reflux disease with esophagitis, without bleeding: Secondary | ICD-10-CM

## 2023-05-24 ENCOUNTER — Encounter: Payer: Self-pay | Admitting: *Deleted

## 2023-05-24 ENCOUNTER — Emergency Department: Payer: 59

## 2023-05-24 ENCOUNTER — Other Ambulatory Visit: Payer: Self-pay

## 2023-05-24 ENCOUNTER — Emergency Department
Admission: EM | Admit: 2023-05-24 | Discharge: 2023-05-24 | Disposition: A | Discharge status: Home / Self Care | Payer: 59 | Attending: Emergency Medicine | Admitting: Emergency Medicine

## 2023-05-24 DIAGNOSIS — K2100 Gastro-esophageal reflux disease with esophagitis, without bleeding: Secondary | ICD-10-CM | POA: Insufficient documentation

## 2023-05-24 DIAGNOSIS — I10 Essential (primary) hypertension: Secondary | ICD-10-CM | POA: Insufficient documentation

## 2023-05-24 DIAGNOSIS — R079 Chest pain, unspecified: Secondary | ICD-10-CM | POA: Diagnosis present

## 2023-05-24 LAB — COMPREHENSIVE METABOLIC PANEL
ALT: 28 U/L (ref 0–44)
AST: 24 U/L (ref 15–41)
Albumin: 4.2 g/dL (ref 3.5–5.0)
Alkaline Phosphatase: 75 U/L (ref 38–126)
Anion gap: 14 (ref 5–15)
BUN: 18 mg/dL (ref 6–20)
CO2: 23 mmol/L (ref 22–32)
Calcium: 9.3 mg/dL (ref 8.9–10.3)
Chloride: 101 mmol/L (ref 98–111)
Creatinine, Ser: 0.57 mg/dL (ref 0.44–1.00)
GFR, Estimated: >60 mL/min (ref >60)
Glucose, Bld: 111 mg/dL — ABNORMAL HIGH (ref 70–99)
Potassium: 3.3 mmol/L — ABNORMAL LOW (ref 3.5–5.1)
Sodium: 138 mmol/L (ref 135–145)
Total Bilirubin: 0.6 mg/dL (ref <1.2)
Total Protein: 7.4 g/dL (ref 6.5–8.1)

## 2023-05-24 LAB — CBC
HCT: 46.7 % — ABNORMAL HIGH (ref 36.0–46.0)
Hemoglobin: 16.1 g/dL — ABNORMAL HIGH (ref 12.0–15.0)
MCH: 30.4 pg (ref 26.0–34.0)
MCHC: 34.5 g/dL (ref 30.0–36.0)
MCV: 88.3 fL (ref 80.0–100.0)
Platelets: 165 10*3/uL (ref 150–400)
RBC: 5.29 MIL/uL — ABNORMAL HIGH (ref 3.87–5.11)
RDW: 13.2 % (ref 11.5–15.5)
WBC: 11.2 10*3/uL — ABNORMAL HIGH (ref 4.0–10.5)
nRBC: 0 % (ref 0.0–0.2)

## 2023-05-24 LAB — LIPASE, BLOOD: Lipase: 40 U/L (ref 11–51)

## 2023-05-24 LAB — TROPONIN I (HIGH SENSITIVITY): Troponin I (High Sensitivity): <2 ng/L (ref <18)

## 2023-05-24 MED ORDER — LIDOCAINE VISCOUS HCL 2 % MT SOLN
15.0000 mL | Freq: Once | OROMUCOSAL | Status: AC
  Administered 2023-05-24: 15 mL via OROMUCOSAL
  Filled 2023-05-24: qty 15

## 2023-05-24 MED ORDER — ALUM & MAG HYDROXIDE-SIMETH 200-200-20 MG/5ML PO SUSP
30.0000 mL | Freq: Once | ORAL | Status: AC
  Administered 2023-05-24: 30 mL via ORAL
  Filled 2023-05-24: qty 30

## 2023-05-24 NOTE — ED Triage Notes (Signed)
Pt reports burning in chest.  Hx gerd.  Pt reports acid reflux, taking rx meds and tums without relief.  Pt reports vomiting.  Pt has sob.  Pt alert  speech clear.

## 2023-05-24 NOTE — ED Provider Notes (Signed)
[facility] Provider Note    Event Date/Time   First MD Initiated Contact with Patient 05/24/23 1833     (approximate)   History   Chief Complaint Gastroesophageal Reflux   HPI  Mandy Baldwin is a 62 y.o. female with past medical history of hypertension, GERD, ITP, and fibromyalgia who presents to the ED complaining of chest pain.  Patient reports that she has been dealing with intermittent burning discomfort in the center of her chest for the past 3 days.  She had an episode of pain today around lunchtime that has gradually eased off in severity.  She denies any fevers, cough, or difficulty breathing.  She states that the symptoms seem to come on after she drank some iced coffee.  She had a similar episode a few days ago when she also had some nausea and vomiting.  She denies any vomiting today but has felt slightly nauseous.  She denies any pain in her abdomen, dysuria, fever, or flank pain.     Physical Exam   Triage Vital Signs: ED Triage Vitals [05/24/23 1701]  Encounter Vitals Group     BP (!) 142/113     Systolic BP Percentile      Diastolic BP Percentile      Pulse Rate (!) 114     Resp 18     Temp 99.2 F (37.3 C)     Temp Source Oral     SpO2 97 %     Weight 270 lb (122.5 kg)     Height 5\' 3"  (1.6 m)     Head Circumference      Peak Flow      Pain Score 0     Pain Loc      Pain Education      Exclude from Growth Chart     Most recent vital signs: Vitals:   05/24/23 1701 05/24/23 1915  BP: (!) 142/113 (!) 149/99  Pulse: (!) 114 (!) 101  Resp: 18 18  Temp: 99.2 F (37.3 C)   SpO2: 97% 95%    Constitutional: Alert and oriented. Eyes: Conjunctivae are normal. Head: Atraumatic. Nose: No congestion/rhinnorhea. Mouth/Throat: Mucous membranes are moist.  Cardiovascular: Normal rate, regular rhythm. Grossly normal heart sounds.  2+ radial pulses bilaterally. Respiratory: Normal respiratory effort.  No retractions. Lungs  CTAB.  No chest wall tenderness to palpation. Gastrointestinal: Soft and nontender. No distention. Musculoskeletal: No lower extremity tenderness nor edema.  Neurologic:  Normal speech and language. No gross focal neurologic deficits are appreciated.    ED Results / Procedures / Treatments   Labs (all labs ordered are listed, but only abnormal results are displayed) Labs Reviewed  CBC - Abnormal; Notable for the following components:      Result Value   WBC 11.2 (*)    RBC 5.29 (*)    Hemoglobin 16.1 (*)    HCT 46.7 (*)    All other components within normal limits  COMPREHENSIVE METABOLIC PANEL - Abnormal; Notable for the following components:   Potassium 3.3 (*)    Glucose, Bld 111 (*)    All other components within normal limits  LIPASE, BLOOD  TROPONIN I (HIGH SENSITIVITY)     EKG  ED ECG REPORT I, Chesley Noon, the attending physician, personally viewed and interpreted this ECG.   Date: 05/24/2023  EKG Time: 17:04  Rate: 116  Rhythm: sinus tachycardia  Axis: Normal  Intervals:none  ST&T Change: None  RADIOLOGY Chest x-ray reviewed  and interpreted by me with no infiltrate, edema, or effusion.  PROCEDURES:  Critical Care performed: No  Procedures   MEDICATIONS ORDERED IN ED: Medications  lidocaine (XYLOCAINE) 2 % viscous mouth solution 15 mL (15 mLs Mouth/Throat Given 05/24/23 1850)  alum & mag hydroxide-simeth (MAALOX/MYLANTA) 200-200-20 MG/5ML suspension 30 mL (30 mLs Oral Given 05/24/23 1850)     IMPRESSION / MDM / ASSESSMENT AND PLAN / ED COURSE  I reviewed the triage vital signs and the nursing notes.                              62 y.o. female with past medical history of hypertension, GERD, ITP, and fibromyalgia who presents to the ED complaining of burning discomfort in the center of her chest intermittently for the past 3 days.  Patient's presentation is most consistent with acute presentation with potential threat to life or bodily  function.  Differential diagnosis includes, but is not limited to, ACS, PE, pneumonia, pneumothorax, musculoskeletal pain, GERD, anxiety.  Patient well-appearing and in no acute distress, vital signs are remarkable for tachycardia but otherwise reassuring.  EKG shows no evidence of arrhythmia or ischemia and troponin within normal limits, doubt ACS given her atypical symptoms.  I also have low suspicion for PE, symptoms sound most consistent with GERD.  Chest x-ray is pending at this time, remainder of labs are reassuring with no significant anemia, leukocytosis, electrolyte abnormality, or AKI.  We will treat with GI cocktail and reassess.  Patient with resolution of symptoms following GI cocktail, chest x-ray is unremarkable.  She is appropriate for discharge home with outpatient follow-up, was counseled to return to the ED for new or worsening symptoms.  Patient agrees with plan.      FINAL CLINICAL IMPRESSION(S) / ED DIAGNOSES   Final diagnoses:  Chest pain, unspecified type  Gastroesophageal reflux disease with esophagitis without hemorrhage     Rx / DC Orders   ED Discharge Orders     None        Note:  This document was prepared using Dragon voice recognition software and may include unintentional dictation errors.   Chesley Noon, MD 05/24/23 1929

## 2023-05-24 NOTE — ED Provider Triage Note (Signed)
Emergency Medicine Provider Triage Evaluation Note  Thera Rosekrans , a 62 y.o. female  was evaluated in triage.  Pt complains of reflux. Patient states she has a history of this and feels similar but is worse. She states she feels a burning in her chest and she feels nauseated.  Review of Systems  Positive: Chest burning, nausea Negative:   Physical Exam  BP (!) 142/113 (BP Location: Left Arm)   Pulse (!) 114   Temp 99.2 F (37.3 C) (Oral)   Resp 18   Ht 5\' 3"  (1.6 m)   Wt 122.5 kg   SpO2 97%   BMI 47.83 kg/m  Gen:   Awake, no distress   Resp:  Normal effort  MSK:   Moves extremities without difficulty  Other:    Medical Decision Making  Medically screening exam initiated at 5:05 PM.  Appropriate orders placed.  Keigan Semones was informed that the remainder of the evaluation will be completed by another provider, this initial triage assessment does not replace that evaluation, and the importance of remaining in the ED until their evaluation is complete.     Cameron Ali, PA-C 05/24/23 1706

## 2023-06-02 ENCOUNTER — Other Ambulatory Visit: Payer: Self-pay | Admitting: Physician Assistant

## 2023-06-02 DIAGNOSIS — K2100 Gastro-esophageal reflux disease with esophagitis, without bleeding: Secondary | ICD-10-CM

## 2023-06-12 ENCOUNTER — Other Ambulatory Visit: Payer: Self-pay | Admitting: Physician Assistant

## 2023-06-12 DIAGNOSIS — M170 Bilateral primary osteoarthritis of knee: Secondary | ICD-10-CM

## 2023-06-14 ENCOUNTER — Other Ambulatory Visit: Payer: Self-pay | Admitting: Physician Assistant

## 2023-06-14 DIAGNOSIS — I10 Essential (primary) hypertension: Secondary | ICD-10-CM

## 2023-07-02 ENCOUNTER — Other Ambulatory Visit: Payer: Self-pay | Admitting: Physician Assistant

## 2023-07-02 DIAGNOSIS — K2100 Gastro-esophageal reflux disease with esophagitis, without bleeding: Secondary | ICD-10-CM

## 2023-07-06 ENCOUNTER — Other Ambulatory Visit: Payer: Self-pay | Admitting: Physician Assistant

## 2023-07-06 DIAGNOSIS — I10 Essential (primary) hypertension: Secondary | ICD-10-CM

## 2023-07-11 ENCOUNTER — Encounter: Payer: Self-pay | Admitting: Physician Assistant

## 2023-07-11 ENCOUNTER — Ambulatory Visit: Payer: 59 | Admitting: Physician Assistant

## 2023-07-11 VITALS — BP 130/65 | HR 76 | Temp 97.6°F | Resp 16 | Ht 63.0 in | Wt 266.2 lb

## 2023-07-11 DIAGNOSIS — Z124 Encounter for screening for malignant neoplasm of cervix: Secondary | ICD-10-CM | POA: Diagnosis not present

## 2023-07-11 DIAGNOSIS — K2100 Gastro-esophageal reflux disease with esophagitis, without bleeding: Secondary | ICD-10-CM

## 2023-07-11 DIAGNOSIS — Z113 Encounter for screening for infections with a predominantly sexual mode of transmission: Secondary | ICD-10-CM

## 2023-07-11 DIAGNOSIS — Z6841 Body Mass Index (BMI) 40.0 and over, adult: Secondary | ICD-10-CM

## 2023-07-11 DIAGNOSIS — I10 Essential (primary) hypertension: Secondary | ICD-10-CM

## 2023-07-11 NOTE — Progress Notes (Signed)
[facility] [address] [place], [place] [address]  Internal MEDICINE  Office Visit Note  Patient Name: Mandy Baldwin  [id number]  [id number]  Date of Service: 07/11/2023  Chief Complaint  Patient presents with   Follow-up   Gastroesophageal Reflux   Hypertension   Hyperlipidemia    HPI Pt is here for routine follow up -Back left ribs a little tender, coughing up some mucus for a few days. States rib pain started before coughing though. It is reproducible with palpation. No SOB and otherwise feels well. Will monitor and consider chest xray. Advised to call office if it continues/does not improve -did go to ED for CP last month, thought to be due to GERD as cardiac work up unremarkable. Does state it had been stressful at work as well and thought could have been some anxiety playing in at the time as well. Does continue to having burning in chest intermittently and sometimes feels like things get stuck on swallowing. Already taking pantoprazole 40mg  BID and takes tums in between. Discussed GI referral. May need upper GI/endoscopy given continuation of reflux symptoms -hair thinning, and is taking hair skin and nails biotin supplement now  -some skin tags on legs, will think about dermatology referral for both these concerns -Has continued to lose weight, down 18lbs since last visit. States this has been intentional  Current Medication: Outpatient Encounter Medications as of 07/11/2023  Medication Sig   aspirin 81 MG tablet Take 81 mg by mouth daily.   atorvastatin (LIPITOR) 20 MG tablet Take 1 tablet (20 mg total) by mouth daily. pm (Patient taking differently: Take 40 mg by mouth daily. pm)   Biotin 2500 MCG CHEW Chew 2,500 mcg by mouth daily.   cholecalciferol (VITAMIN D) 1000 UNITS tablet Take 2,000 Units by mouth daily.   cyclobenzaprine (FLEXERIL) 5 MG tablet Take 1 tablet (5 mg total) by mouth 3 (three) times daily as needed for muscle spasms.   fluticasone  (FLONASE) 50 MCG/ACT nasal spray Use 2 spray(s) in each nostril once daily   magnesium oxide (MAG-OX) 400 MG tablet Take 400 mg by mouth daily.   meloxicam (MOBIC) 7.5 MG tablet Take 1 tablet by mouth once daily   metoprolol succinate (TOPROL-XL) 25 MG 24 hr tablet Take 2 tablets by mouth once daily   Multiple Vitamins-Minerals (MULTIVITAL PO) Take 1 tablet by mouth daily.   pantoprazole (PROTONIX) 40 MG tablet Take 1 tablet by mouth twice daily   promethazine (PHENERGAN) 12.5 MG tablet TAKE 1 TABLET BY MOUTH EVERY 8 HOURS AS NEEDED FOR NAUSEA AND VOMITING   triamcinolone cream (KENALOG) 0.1 % Apply 1 application. topically 2 (two) times daily as needed (itching and rash).   triamterene-hydrochlorothiazide (MAXZIDE-25) 37.5-25 MG tablet TAKE 1 TABLET BY MOUTH ONCE DAILY (APPOINTMENT  REQUIRED  FOR  FUTURE  REFILLS)   vitamin k 100 MCG tablet Take 100 mcg by mouth daily.   No facility-administered encounter medications on file as of 07/11/2023.    Surgical History: Past Surgical History:  Procedure Laterality Date   COLONOSCOPY     COLONOSCOPY WITH PROPOFOL N/A 02/15/2020   Procedure: COLONOSCOPY WITH PROPOFOL;  Surgeon: [name], MD;  Location: [facility];  Service: Endoscopy;  Laterality: N/A;  priority 4   ESOPHAGOGASTRODUODENOSCOPY (EGD) WITH PROPOFOL N/A 09/18/2015   Procedure: ESOPHAGOGASTRODUODENOSCOPY (EGD) WITH PROPOFOL with ballon dilatation;  Surgeon: [name], MD;  Location: [facility];  Service: Endoscopy;  Laterality: N/A;   LAPAROSCOPIC OVARIAN  Medical History: Past Medical History:  Diagnosis Date   GERD (gastroesophageal reflux disease)    Hyperlipidemia    Hypertension    Knee pain    right knee, bone on bone   Motion sickness    [place]   Osteoarthritis of both knees    Shingles 12/14/2014   TMJ (dislocation of temporomandibular joint)     Family History: Family History  Problem Relation Age of Onset   Dementia Mother     Aortic aneurysm Mother    COPD Father    Breast cancer Sister 36   Diabetes Sister    COPD Brother    Cancer Brother    Stroke Sister     Social History   Socioeconomic History   Marital status: Married    Spouse name: Tsion Inghram   Number of children: 0   Years of education: 13   Highest education level: Not on file  Occupational History   Occupation: Clinical biochemist    Comment: Production manager: [employer]  Tobacco Use   Smoking status: Never   Smokeless tobacco: Never  Vaping Use   Vaping status: Never Used  Substance and Sexual Activity   Alcohol use: No   Drug use: No   Sexual activity: Not Currently  Other Topics Concern   Not on file  Social History Narrative   Not on file   Social Drivers of Health   Financial Resource Strain: Not on file  Food Insecurity: No Food Insecurity (02/17/2023)   Hunger Vital Sign    Worried About Running Out of Food in the Last Year: Never true    Ran Out of Food in the Last Year: Never true  Transportation Needs: No Transportation Needs (02/17/2023)   PRAPARE - Administrator, Civil Service (Medical): No    Lack of Transportation (Non-Medical): No  Physical Activity: Unknown (04/14/2017)   Exercise Vital Sign    Days of Exercise per Week: 0 days    Minutes of Exercise per Session: Not on file  Stress: Not on file  Social Connections: Not on file  Intimate Partner Violence: Not At Risk (02/17/2023)   Humiliation, Afraid, Rape, and Kick questionnaire    Fear of Current or Ex-Partner: No    Emotionally Abused: No    Physically Abused: No    Sexually Abused: No      Review of Systems  Constitutional:  Negative for chills, fatigue and unexpected weight change.  HENT:  Positive for postnasal drip. Negative for congestion, rhinorrhea, sneezing and sore throat.   Eyes:  Negative for redness.  Respiratory:  Positive for cough. Negative for chest tightness, shortness of breath and wheezing.    Cardiovascular:  Negative for chest pain and palpitations.  Gastrointestinal:  Negative for abdominal pain, constipation, diarrhea, nausea and vomiting.       Reflux  Genitourinary:  Negative for dysuria and frequency.  Musculoskeletal:  Positive for arthralgias. Negative for back pain, joint swelling and neck pain.  Skin:  Negative for rash.  Neurological: Negative.  Negative for tremors and numbness.  Hematological:  Negative for adenopathy. Does not bruise/bleed easily.  Psychiatric/Behavioral:  Positive for sleep disturbance. Negative for behavioral problems (Depression) and suicidal ideas. The patient is not nervous/anxious.     Vital Signs: BP 130/65   Pulse 76   Temp 97.6 F (36.4 C)   Resp 16   Ht 5\' 3"  (1.6 m)   Wt 266 lb 3.2 oz (  120.7 kg)   SpO2 97%   BMI 47.16 kg/m    Physical Exam Vitals and nursing note reviewed. Exam conducted with a chaperone present.  Constitutional:      General: She is not in acute distress.    Appearance: Normal appearance. She is well-developed. She is not diaphoretic.  HENT:     Head: Normocephalic and atraumatic.     Mouth/Throat:     Pharynx: No oropharyngeal exudate.  Eyes:     Pupils: Pupils are equal, round, and reactive to light.  Neck:     Thyroid: No thyromegaly.     Vascular: No JVD.     Trachea: No tracheal deviation.  Cardiovascular:     Rate and Rhythm: Normal rate and regular rhythm.     Heart sounds: Normal heart sounds. No murmur heard.    No friction rub. No gallop.  Pulmonary:     Effort: Pulmonary effort is normal. No respiratory distress.     Breath sounds: No wheezing or rales.     Comments: Tenderness along left posterior ribs/flank Chest:     Chest wall: Tenderness present.  Abdominal:     General: Bowel sounds are normal.     Palpations: Abdomen is soft.  Genitourinary:    Exam position: Lithotomy position.     Cervix: Normal.     Comments: Pap performed Musculoskeletal:        General: Tenderness  present. Normal range of motion.     Cervical back: Normal range of motion and neck supple.  Lymphadenopathy:     Cervical: No cervical adenopathy.  Skin:    General: Skin is warm and dry.  Neurological:     Mental Status: She is alert and oriented to person, place, and time.     Cranial Nerves: No cranial nerve deficit.  Psychiatric:        Behavior: Behavior normal.        Thought Content: Thought content normal.        Judgment: Judgment normal.        Assessment/Plan: 1. Gastroesophageal reflux disease with esophagitis without hemorrhage (Primary) Worsening reflux symptoms despite medication, will refer to GI. May need endoscopy. If worsening or new CP occurs advised to go back to ED. Avoid triggering foods/drinks. - Ambulatory referral to Gastroenterology  2. Essential hypertension Well controlled, continue current medication  3. Screening for STDs (sexually transmitted diseases) - NuSwab Vaginitis Plus (VG+)  4. Routine cervical smear - IGP, Aptima HPV  5. Morbid obesity with BMI of 45.0-49.9, adult (HCC) Down 18lbs since last visit and reports this has been intentional and is continuing to work on wt loss.   General Counseling: Oza verbalizes understanding of the findings of todays visit and agrees with plan of treatment. I have discussed any further diagnostic evaluation that may be needed or ordered today. We also reviewed her medications today. she has been encouraged to call the office with any questions or concerns that should arise related to todays visit.    Orders Placed This Encounter  Procedures   NuSwab Vaginitis Plus (VG+)   Ambulatory referral to Gastroenterology    No orders of the defined types were placed in this encounter.   This patient was seen by Lynn Ito, PA-C in collaboration with Dr. Beverely Risen as a part of collaborative care agreement.   Total time spent:30 Minutes Time spent includes review of chart, medications, test  results, and follow up plan with the patient.  Dr Lyndon Code Internal medicine

## 2023-07-14 ENCOUNTER — Encounter: Payer: Self-pay | Admitting: Physician Assistant

## 2023-07-14 LAB — IGP, APTIMA HPV: HPV Aptima: NEGATIVE

## 2023-07-14 LAB — NUSWAB VAGINITIS PLUS (VG+)
Candida albicans, NAA: NEGATIVE
Candida glabrata, NAA: NEGATIVE
Chlamydia trachomatis, NAA: NEGATIVE
Neisseria gonorrhoeae, NAA: NEGATIVE
Trich vag by NAA: NEGATIVE

## 2023-07-15 NOTE — Telephone Encounter (Signed)
 Pt advised Nuswab and pap is negative

## 2023-09-04 ENCOUNTER — Other Ambulatory Visit: Payer: Self-pay | Admitting: Physician Assistant

## 2023-09-04 ENCOUNTER — Other Ambulatory Visit: Payer: Self-pay | Admitting: Nurse Practitioner

## 2023-09-04 DIAGNOSIS — I10 Essential (primary) hypertension: Secondary | ICD-10-CM

## 2023-09-04 DIAGNOSIS — M170 Bilateral primary osteoarthritis of knee: Secondary | ICD-10-CM

## 2023-09-04 DIAGNOSIS — K2100 Gastro-esophageal reflux disease with esophagitis, without bleeding: Secondary | ICD-10-CM

## 2023-09-13 ENCOUNTER — Ambulatory Visit: Payer: 59 | Admitting: Internal Medicine

## 2023-09-13 ENCOUNTER — Other Ambulatory Visit: Payer: 59

## 2023-10-01 ENCOUNTER — Other Ambulatory Visit: Payer: Self-pay | Admitting: Physician Assistant

## 2023-10-01 DIAGNOSIS — I10 Essential (primary) hypertension: Secondary | ICD-10-CM

## 2023-10-04 ENCOUNTER — Other Ambulatory Visit: Payer: Self-pay | Admitting: Nurse Practitioner

## 2023-10-04 DIAGNOSIS — K2100 Gastro-esophageal reflux disease with esophagitis, without bleeding: Secondary | ICD-10-CM

## 2023-10-13 ENCOUNTER — Telehealth: Payer: Self-pay | Admitting: Physician Assistant

## 2023-10-13 NOTE — Telephone Encounter (Signed)
 S/w patient about GI referral. She stated she is feeling better, no longer needs to see GI. Referral cancelled-Toni

## 2023-10-19 ENCOUNTER — Telehealth: Payer: Self-pay | Admitting: Physician Assistant

## 2023-10-19 NOTE — Telephone Encounter (Signed)
 Patient called. Now wants to see GI. I gave her tele [phone] for [facility]. She will call to see if they will go ahead and schedule her or not since referral was closed because she refused-Toni

## 2023-10-19 NOTE — Telephone Encounter (Signed)
 Gastroenterology referral sent via [facility] to [facility] per patient request.

## 2023-10-25 ENCOUNTER — Telehealth: Payer: Self-pay | Admitting: Physician Assistant

## 2023-10-25 NOTE — Telephone Encounter (Signed)
 Error

## 2023-11-01 ENCOUNTER — Other Ambulatory Visit: Payer: Self-pay | Admitting: Nurse Practitioner

## 2023-11-01 DIAGNOSIS — K2100 Gastro-esophageal reflux disease with esophagitis, without bleeding: Secondary | ICD-10-CM

## 2023-11-07 ENCOUNTER — Encounter: Payer: Self-pay | Admitting: Physician Assistant

## 2023-11-07 ENCOUNTER — Ambulatory Visit (INDEPENDENT_AMBULATORY_CARE_PROVIDER_SITE_OTHER): Payer: 59 | Admitting: Physician Assistant

## 2023-11-07 VITALS — BP 120/68 | HR 84 | Temp 98.4°F | Resp 16 | Ht 63.0 in | Wt 261.0 lb

## 2023-11-07 DIAGNOSIS — E782 Mixed hyperlipidemia: Secondary | ICD-10-CM

## 2023-11-07 DIAGNOSIS — M7711 Lateral epicondylitis, right elbow: Secondary | ICD-10-CM | POA: Diagnosis not present

## 2023-11-07 DIAGNOSIS — E538 Deficiency of other specified B group vitamins: Secondary | ICD-10-CM

## 2023-11-07 DIAGNOSIS — D582 Other hemoglobinopathies: Secondary | ICD-10-CM

## 2023-11-07 DIAGNOSIS — Z6841 Body Mass Index (BMI) 40.0 and over, adult: Secondary | ICD-10-CM

## 2023-11-07 DIAGNOSIS — R5383 Other fatigue: Secondary | ICD-10-CM

## 2023-11-07 DIAGNOSIS — E559 Vitamin D deficiency, unspecified: Secondary | ICD-10-CM

## 2023-11-07 DIAGNOSIS — I10 Essential (primary) hypertension: Secondary | ICD-10-CM | POA: Diagnosis not present

## 2023-11-07 DIAGNOSIS — R946 Abnormal results of thyroid function studies: Secondary | ICD-10-CM

## 2023-11-07 NOTE — Progress Notes (Signed)
 [facility] [address] [place], [place] [address]  Internal MEDICINE  Office Visit Note  Patient Name: Mandy Baldwin  [id number]  [id number]  Date of Service: 11/07/2023  Chief Complaint  Patient presents with   Follow-up   Gastroesophageal Reflux   Hypertension   Hyperlipidemia   Arm Problem    Knot on elbow on right arm    HPI Pt is here for routine follow up -down another 5lbs since last visit, has been working on weight loss goals -ortho put gel injections in her right knee, [facility] in [place]. Helping some -dose have area of slight swelling along right arm below elbow. States she showed ortho while there for her knee and they mentioned tendonitis which I agree with in office today. No discernable knot/mass and no pain with ROM at elbow. States she feels it when going to grip something to pick it up. May try elbow brace for tendonitis, ice and continue mobic . If not improving or any changes then she may call our office or follow up with ortho if needed. -still stresed with taking care of husband. Sleep not great, but doesn't want to take meds due to listening out for him. Declines needing anything for daytime at this time either. -not following up with hematology at this time. Will go ahead and order labs for CPE. Platelets had improved on last check in ED, but hemoglobin still high and discussed considering sleep study vs overnight pulse ox. She will think about this. Dicussed home testing options  Current Medication: Outpatient Encounter Medications as of 11/07/2023  Medication Sig   aspirin  81 MG tablet Take 81 mg by mouth daily.   Biotin 2500 MCG CHEW Chew 2,500 mcg by mouth daily.   cholecalciferol (VITAMIN D ) 1000 UNITS tablet Take 2,000 Units by mouth daily.   cyclobenzaprine  (FLEXERIL ) 5 MG tablet Take 1 tablet (5 mg total) by mouth 3 (three) times daily as needed for muscle spasms.   fluticasone  (FLONASE ) 50 MCG/ACT nasal spray Use 2 spray(s)  in each nostril once daily   magnesium oxide (MAG-OX) 400 MG tablet Take 400 mg by mouth daily.   meloxicam  (MOBIC ) 7.5 MG tablet Take 1 tablet by mouth once daily   metoprolol  succinate (TOPROL -XL) 25 MG 24 hr tablet Take 2 tablets by mouth once daily   Multiple Vitamins-Minerals (MULTIVITAL PO) Take 1 tablet by mouth daily.   pantoprazole  (PROTONIX ) 40 MG tablet Take 1 tablet by mouth twice daily   promethazine  (PHENERGAN ) 12.5 MG tablet TAKE 1 TABLET BY MOUTH EVERY 8 HOURS AS NEEDED FOR NAUSEA AND VOMITING   triamcinolone  cream (KENALOG ) 0.1 % Apply 1 application. topically 2 (two) times daily as needed (itching and rash).   triamterene -hydrochlorothiazide  (MAXZIDE-25) 37.5-25 MG tablet TAKE 1 TABLET BY MOUTH ONCE DAILY . APPOINTMENT REQUIRED FOR FUTURE REFILLS   vitamin k 100 MCG tablet Take 100 mcg by mouth daily.   atorvastatin  (LIPITOR) 20 MG tablet Take 1 tablet (20 mg total) by mouth daily. pm (Patient taking differently: Take 40 mg by mouth daily. pm)   No facility-administered encounter medications on file as of 11/07/2023.    Surgical History: Past Surgical History:  Procedure Laterality Date   COLONOSCOPY     COLONOSCOPY WITH PROPOFOL  N/A 02/15/2020   Procedure: COLONOSCOPY WITH PROPOFOL ;  Surgeon: Marnee Sink, MD;  Location: [facility];  Service: Endoscopy;  Laterality: N/A;  priority 4   ESOPHAGOGASTRODUODENOSCOPY (EGD) WITH PROPOFOL  N/A 09/18/2015   Procedure: ESOPHAGOGASTRODUODENOSCOPY (EGD) WITH  PROPOFOL  with ballon dilatation;  Surgeon: Marnee Sink, MD;  Location: [facility];  Service: Endoscopy;  Laterality: N/A;   LAPAROSCOPIC OVARIAN      Medical History: Past Medical History:  Diagnosis Date   GERD (gastroesophageal reflux disease)    Hyperlipidemia    Hypertension    Knee pain    right knee, bone on bone   [place]   Osteoarthritis of both knees    Shingles 12/14/2014   TMJ (dislocation of temporomandibular joint)      Family History: Family History  Problem Relation Age of Onset   Dementia Mother    Aortic aneurysm Mother    COPD Father    Breast cancer Sister 85   Diabetes Sister    COPD Brother    Cancer Brother    Stroke Sister     Social History   Socioeconomic History   Marital status: Married    Spouse name: Ashantee Deupree   Number of children: 0   Years of education: 13   Highest education level: Not on file  Occupational History   Occupation: Clinical biochemist    Comment: Production manager: [employer]  Tobacco Use   Smoking status: Never   Smokeless tobacco: Never  Vaping Use   Vaping status: Never Used  Substance and Sexual Activity   Alcohol use: No   Drug use: No   Sexual activity: Not Currently  Other Topics Concern   Not on file  Social History Narrative   Not on file   Social Drivers of Health   Financial Resource Strain: Low Risk  (10/06/2023)   Received from [facility]   Overall Financial Resource Strain (CARDIA)    Difficulty of Paying Living Expenses: Not hard at all  Food Insecurity: No Food Insecurity (10/06/2023)   Received from [facility]   Hunger Vital Sign    Worried About Running Out of Food in the Last Year: Never true    Ran Out of Food in the Last Year: Never true  Transportation Needs: No Transportation Needs (10/06/2023)   Received from [facility] - Transportation    In the past 12 months, has lack of transportation kept you from medical appointments or from getting medications?: No    Lack of Transportation (Non-Medical): No  Physical Activity: Unknown (04/14/2017)   Exercise Vital Sign    Days of Exercise per Week: 0 days    Minutes of Exercise per Session: Not on file  Stress: Not on file  Social Connections: Not on file  Intimate Partner Violence: Not At Risk (02/17/2023)   Humiliation, Afraid, Rape, and Kick questionnaire    Fear of Current or Ex-Partner: No     Emotionally Abused: No    Physically Abused: No    Sexually Abused: No      Review of Systems  Constitutional:  Negative for chills, fatigue and unexpected weight change.  HENT:  Negative for congestion, rhinorrhea, sneezing and sore throat.   Eyes:  Negative for redness.  Respiratory:  Negative for cough, chest tightness and shortness of breath.   Cardiovascular:  Negative for chest pain and palpitations.  Gastrointestinal:  Negative for abdominal pain, constipation, diarrhea, nausea and vomiting.  Genitourinary:  Negative for dysuria and frequency.  Musculoskeletal:  Positive for arthralgias. Negative for back pain, joint swelling and neck pain.  Skin:  Negative for rash.  Neurological: Negative.  Negative for tremors and numbness.  Hematological:  Negative for adenopathy. Does not bruise/bleed easily.  Psychiatric/Behavioral:  Positive for sleep disturbance. Negative for behavioral problems (Depression) and suicidal ideas. The patient is not nervous/anxious.     Vital Signs: BP 120/68   Pulse 84   Temp 98.4 F (36.9 C)   Resp 16   Ht 5\' 3"  (1.6 m)   Wt 261 lb (118.4 kg)   SpO2 97%   BMI 46.23 kg/m    Physical Exam Vitals and nursing note reviewed.  Constitutional:      General: She is not in acute distress.    Appearance: She is well-developed. She is not diaphoretic.  HENT:     Head: Normocephalic and atraumatic.  Eyes:     Extraocular Movements: Extraocular movements intact.  Neck:     Thyroid : No thyromegaly.     Vascular: No JVD.     Trachea: No tracheal deviation.  Cardiovascular:     Rate and Rhythm: Normal rate and regular rhythm.     Heart sounds: Normal heart sounds. No murmur heard.    No friction rub. No gallop.  Pulmonary:     Effort: Pulmonary effort is normal. No respiratory distress.     Breath sounds: No wheezing or rales.  Chest:     Chest wall: No tenderness.  Musculoskeletal:        General: Swelling present. Normal range of motion.      Comments: A little swelling distal to right lateral epicondyle, normal ROM at elbow, no distinct mass or lump. No skin changes. Not tender to palpation  Skin:    General: Skin is warm and dry.  Neurological:     Mental Status: She is alert and oriented to person, place, and time.     Cranial Nerves: No cranial nerve deficit.  Psychiatric:        Behavior: Behavior normal.        Thought Content: Thought content normal.        Judgment: Judgment normal.        Assessment/Plan: 1. Essential hypertension (Primary) Well controlled, continue current medications  2. Lateral epicondylitis of right elbow Continue mobic  and may use ice and brace. Call if not improving  3. Elevated hemoglobin (HCC) Discussed considering sleep study vs overnight pulse ox, pt will think about this and will monitor labs - CBC w/Diff/Platelet  4. Mixed hyperlipidemia Continue Lipitor and will update labs - Lipid Panel With LDL/HDL Ratio  5. Vitamin D  deficiency - VITAMIN D  25 Hydroxy (Vit-D Deficiency, Fractures)  6. B12 deficiency - B12 and Folate Panel  7. Abnormal thyroid  exam - TSH + free T4  8. Other fatigue - CBC w/Diff/Platelet - Comprehensive metabolic panel with GFR - TSH + free T4 - Lipid Panel With LDL/HDL Ratio - B12 and Folate Panel - VITAMIN D  25 Hydroxy (Vit-D Deficiency, Fractures) - Fe+TIBC+Fer  9. Morbid obesity with BMI of 45.0-49.9, adult (HCC) Down 5lbs since last visit, continue to work on diet and exercise   General Counseling: Tyneka verbalizes understanding of the findings of todays visit and agrees with plan of treatment. I have discussed any further diagnostic evaluation that may be needed or ordered today. We also reviewed her medications today. she has been encouraged to call the office with any questions or concerns that should arise related to todays visit.    Orders Placed This Encounter  Procedures   CBC w/Diff/Platelet   Comprehensive metabolic panel  with GFR  TSH + free T4   Lipid Panel With LDL/HDL Ratio   B12 and Folate Panel   VITAMIN D  25 Hydroxy (Vit-D Deficiency, Fractures)   Fe+TIBC+Fer    No orders of the defined types were placed in this encounter.   This patient was seen by Taylor Favia, PA-C in collaboration with Dr. Verneta Gone as a part of collaborative care agreement.   Total time spent:30 Minutes Time spent includes review of chart, medications, test results, and follow up plan with the patient.      Dr Fozia M Khan Internal medicine

## 2023-11-13 ENCOUNTER — Encounter: Payer: Self-pay | Admitting: Physician Assistant

## 2023-11-13 ENCOUNTER — Other Ambulatory Visit: Payer: Self-pay | Admitting: Nurse Practitioner

## 2023-11-14 ENCOUNTER — Other Ambulatory Visit: Payer: Self-pay

## 2023-11-14 MED ORDER — TRIAMCINOLONE ACETONIDE 0.1 % EX CREA
1.0000 | TOPICAL_CREAM | Freq: Two times a day (BID) | CUTANEOUS | 2 refills | Status: AC | PRN
Start: 2023-11-14 — End: ?

## 2023-11-29 ENCOUNTER — Other Ambulatory Visit: Payer: Self-pay | Admitting: Physician Assistant

## 2023-11-29 ENCOUNTER — Other Ambulatory Visit: Payer: Self-pay | Admitting: Nurse Practitioner

## 2023-11-29 DIAGNOSIS — K2100 Gastro-esophageal reflux disease with esophagitis, without bleeding: Secondary | ICD-10-CM

## 2023-11-29 DIAGNOSIS — M170 Bilateral primary osteoarthritis of knee: Secondary | ICD-10-CM

## 2023-11-29 DIAGNOSIS — I10 Essential (primary) hypertension: Secondary | ICD-10-CM

## 2023-12-15 ENCOUNTER — Encounter: Payer: Self-pay | Admitting: Physician Assistant

## 2023-12-18 ENCOUNTER — Other Ambulatory Visit: Payer: Self-pay | Admitting: Nurse Practitioner

## 2023-12-18 DIAGNOSIS — R110 Nausea: Secondary | ICD-10-CM

## 2023-12-20 ENCOUNTER — Other Ambulatory Visit: Payer: Self-pay | Admitting: Physician Assistant

## 2023-12-20 DIAGNOSIS — J0110 Acute frontal sinusitis, unspecified: Secondary | ICD-10-CM

## 2023-12-20 DIAGNOSIS — R110 Nausea: Secondary | ICD-10-CM

## 2023-12-20 MED ORDER — PROMETHAZINE HCL 12.5 MG PO TABS: ORAL_TABLET | ORAL | 0 refills | Status: DC

## 2023-12-20 MED ORDER — AMOXICILLIN-POT CLAVULANATE 875-125 MG PO TABS: 1.0000 | ORAL_TABLET | Freq: Two times a day (BID) | ORAL | 0 refills | Status: DC

## 2023-12-27 ENCOUNTER — Other Ambulatory Visit: Payer: Self-pay | Admitting: Physician Assistant

## 2023-12-27 ENCOUNTER — Other Ambulatory Visit: Payer: Self-pay | Admitting: Nurse Practitioner

## 2023-12-27 DIAGNOSIS — I10 Essential (primary) hypertension: Secondary | ICD-10-CM

## 2023-12-27 DIAGNOSIS — K2100 Gastro-esophageal reflux disease with esophagitis, without bleeding: Secondary | ICD-10-CM

## 2024-02-08 ENCOUNTER — Encounter: Payer: Self-pay | Admitting: Physician Assistant

## 2024-02-08 ENCOUNTER — Other Ambulatory Visit: Payer: Self-pay

## 2024-02-08 ENCOUNTER — Other Ambulatory Visit: Payer: Self-pay | Admitting: Physician Assistant

## 2024-02-08 DIAGNOSIS — Z1231 Encounter for screening mammogram for malignant neoplasm of breast: Secondary | ICD-10-CM

## 2024-02-09 ENCOUNTER — Other Ambulatory Visit: Payer: Self-pay

## 2024-02-09 LAB — CBC WITH DIFFERENTIAL/PLATELET
Basophils Absolute: 0.1 x10E3/uL (ref 0.0–0.2)
Basos: 1 %
EOS (ABSOLUTE): 0.1 x10E3/uL (ref 0.0–0.4)
Eos: 2 %
Hematocrit: 49 % — ABNORMAL HIGH (ref 34.0–46.6)
Hemoglobin: 15.5 g/dL (ref 11.1–15.9)
Immature Grans (Abs): 0 x10E3/uL (ref 0.0–0.1)
Immature Granulocytes: 0 %
Lymphocytes Absolute: 2.9 x10E3/uL (ref 0.7–3.1)
Lymphs: 34 %
MCH: 29.8 pg (ref 26.6–33.0)
MCHC: 31.6 g/dL (ref 31.5–35.7)
MCV: 94 fL (ref 79–97)
Monocytes Absolute: 0.6 x10E3/uL (ref 0.1–0.9)
Monocytes: 7 %
Neutrophils Absolute: 4.9 x10E3/uL (ref 1.4–7.0)
Neutrophils: 56 %
Platelets: 239 x10E3/uL (ref 150–450)
RBC: 5.2 x10E6/uL (ref 3.77–5.28)
RDW: 11.8 % (ref 11.7–15.4)
WBC: 8.7 x10E3/uL (ref 3.4–10.8)

## 2024-02-09 LAB — COMPREHENSIVE METABOLIC PANEL WITH GFR
ALT: 22 IU/L (ref 0–32)
AST: 24 IU/L (ref 0–40)
Albumin: 4.3 g/dL (ref 3.9–4.9)
Alkaline Phosphatase: 96 IU/L (ref 44–121)
BUN/Creatinine Ratio: 30 — ABNORMAL HIGH (ref 12–28)
BUN: 17 mg/dL (ref 8–27)
Bilirubin Total: 0.7 mg/dL (ref 0.0–1.2)
CO2: 23 mmol/L (ref 20–29)
Calcium: 9.7 mg/dL (ref 8.7–10.3)
Chloride: 99 mmol/L (ref 96–106)
Creatinine, Ser: 0.57 mg/dL (ref 0.57–1.00)
Globulin, Total: 2.6 g/dL (ref 1.5–4.5)
Glucose: 94 mg/dL (ref 70–99)
Potassium: 4.2 mmol/L (ref 3.5–5.2)
Sodium: 140 mmol/L (ref 134–144)
Total Protein: 6.9 g/dL (ref 6.0–8.5)
eGFR: 103 mL/min/1.73 (ref >59)

## 2024-02-09 LAB — TSH+FREE T4
Free T4: 1.3 ng/dL (ref 0.82–1.77)
TSH: 0.616 u[IU]/mL (ref 0.450–4.500)

## 2024-02-09 LAB — LIPID PANEL WITH LDL/HDL RATIO
Cholesterol, Total: 158 mg/dL (ref 100–199)
HDL: 54 mg/dL (ref >39)
LDL Chol Calc (NIH): 81 mg/dL (ref 0–99)
LDL/HDL Ratio: 1.5 ratio (ref 0.0–3.2)
Triglycerides: 129 mg/dL (ref 0–149)
VLDL Cholesterol Cal: 23 mg/dL (ref 5–40)

## 2024-02-09 LAB — IRON,TIBC AND FERRITIN PANEL
Ferritin: 105 ng/mL (ref 15–150)
Iron Saturation: 48 % (ref 15–55)
Iron: 151 ug/dL — ABNORMAL HIGH (ref 27–139)
Total Iron Binding Capacity: 317 ug/dL (ref 250–450)
UIBC: 166 ug/dL (ref 118–369)

## 2024-02-09 LAB — B12 AND FOLATE PANEL
Folate: >20 ng/mL (ref >3.0)
Vitamin B-12: 613 pg/mL (ref 232–1245)

## 2024-02-09 LAB — VITAMIN D 25 HYDROXY (VIT D DEFICIENCY, FRACTURES): Vit D, 25-Hydroxy: 79.8 ng/mL (ref 30.0–100.0)

## 2024-02-09 MED ORDER — COVID-19 SUBUNIT VACC-NOVAVAX 5 MCG/0.5ML IM SUSY: 5.0000 ug | PREFILLED_SYRINGE | Freq: Once | INTRAMUSCULAR | 0 refills | Status: DC | PRN

## 2024-02-09 MED ORDER — COVID-19 MRNA 2023-2024 VACCINE (COMIRNATY) 0.3 ML INJECTION: 0.3000 mL | Freq: Once | INTRAMUSCULAR | 0 refills | Status: DC

## 2024-02-09 NOTE — Telephone Encounter (Signed)
 As per lauren  send covid 19 injection

## 2024-02-13 ENCOUNTER — Ambulatory Visit: Payer: Self-pay | Admitting: Physician Assistant

## 2024-02-14 NOTE — Telephone Encounter (Signed)
 Spoke with patient regarding labs. Patient refusing sleep study due to husband's medical issues.

## 2024-02-14 NOTE — Telephone Encounter (Signed)
-----   Message from Tinnie MARLA Pro sent at 02/13/2024 10:46 AM EDT ----- Please let her know her iron was a little high and should decrease supplement if taking. Hematocrit still a little elevated and we can discuss sleep study/overnight pulse ox again at next visit.  Otherwise labs look good overall ----- Message ----- From: Interface, [facility] Lab Results In Sent: 02/09/2024   5:36 AM EDT To: Tinnie MARLA Pro, PA-C

## 2024-02-26 ENCOUNTER — Other Ambulatory Visit: Payer: Self-pay | Admitting: Physician Assistant

## 2024-02-26 DIAGNOSIS — I10 Essential (primary) hypertension: Secondary | ICD-10-CM

## 2024-03-08 ENCOUNTER — Ambulatory Visit
Admission: RE | Admit: 2024-03-08 | Discharge: 2024-03-08 | Disposition: A | Discharge status: Home / Self Care | Source: Ambulatory Visit | Attending: Physician Assistant | Admitting: Physician Assistant

## 2024-03-08 DIAGNOSIS — Z1231 Encounter for screening mammogram for malignant neoplasm of breast: Secondary | ICD-10-CM | POA: Diagnosis present

## 2024-03-22 ENCOUNTER — Encounter: Payer: Self-pay | Admitting: Physician Assistant

## 2024-03-22 ENCOUNTER — Ambulatory Visit: Payer: 59 | Admitting: Physician Assistant

## 2024-03-22 VITALS — BP 112/70 | HR 63 | Temp 98.3°F | Resp 16 | Ht 63.0 in | Wt 244.3 lb

## 2024-03-22 DIAGNOSIS — Z0001 Encounter for general adult medical examination with abnormal findings: Secondary | ICD-10-CM | POA: Diagnosis not present

## 2024-03-22 DIAGNOSIS — Z6841 Body Mass Index (BMI) 40.0 and over, adult: Secondary | ICD-10-CM | POA: Diagnosis not present

## 2024-03-22 DIAGNOSIS — R718 Other abnormality of red blood cells: Secondary | ICD-10-CM

## 2024-03-22 DIAGNOSIS — R300 Dysuria: Secondary | ICD-10-CM

## 2024-03-22 NOTE — Progress Notes (Signed)
 [facility] [address] [place], [place] [address]  Internal MEDICINE  Office Visit Note  Patient Name: Mandy Baldwin  [id number]  [id number]  Date of Service: 03/22/2024  Chief Complaint  Patient presents with   Annual Exam   Gastroesophageal Reflux   Hypertension   Hyperlipidemia     HPI Pt is here for routine health maintenance examination -reviewed labs over the phone, hematocrit a little elevated but pt declines ONO/sleep study due to husband's health concerns right now. Discussed home testing options and she will think about this -saw GI and is switching to Nexium, scheduled upper GI 04/13/24 -down 17lbs since last visit, has been really working on this -cardiology ordered carotid US  states this went well -mammogram UTD -PNA 09/09/23, states she also had a hep A shot -stress at work lower now since old boss is gone  Current Medication: Outpatient Encounter Medications as of 03/22/2024  Medication Sig   aspirin  81 MG tablet Take 81 mg by mouth daily.   atorvastatin  (LIPITOR) 20 MG tablet Take 1 tablet (20 mg total) by mouth daily. pm (Patient taking differently: Take 40 mg by mouth daily. pm)   Biotin 2500 MCG CHEW Chew 2,500 mcg by mouth daily.   cholecalciferol (VITAMIN D ) 1000 UNITS tablet Take 2,000 Units by mouth daily.   cyclobenzaprine  (FLEXERIL ) 5 MG tablet Take 1 tablet (5 mg total) by mouth 3 (three) times daily as needed for muscle spasms.   esomeprazole (NEXIUM) 40 MG capsule Take 40 mg by mouth 2 (two) times daily before a meal.   fluticasone  (FLONASE ) 50 MCG/ACT nasal spray Use 2 spray(s) in each nostril once daily   magnesium oxide (MAG-OX) 400 MG tablet Take 400 mg by mouth daily.   meloxicam  (MOBIC ) 7.5 MG tablet Take 1 tablet by mouth once daily   metoprolol  succinate (TOPROL -XL) 25 MG 24 hr tablet Take 2 tablets by mouth once daily   Multiple Vitamins-Minerals (MULTIVITAL PO) Take 1 tablet by mouth daily.   promethazine   (PHENERGAN ) 12.5 MG tablet TAKE 1 TABLET BY MOUTH EVERY 8 HOURS AS NEEDED FOR NAUSEA AND VOMITING   triamcinolone  cream (KENALOG ) 0.1 % Apply 1 Application topically 2 (two) times daily as needed (itching and rash).   triamterene -hydrochlorothiazide  (MAXZIDE-25) 37.5-25 MG tablet TAKE 1 TABLET BY MOUTH ONCE DAILY (APPOINMENT  REQUIRED  FOR  FUTURE  REFILLS)   vitamin k 100 MCG tablet Take 100 mcg by mouth daily.   [DISCONTINUED] amoxicillin -clavulanate (AUGMENTIN ) 875-125 MG tablet Take 1 tablet by mouth 2 (two) times daily. Take with food.   [DISCONTINUED] COVID-19 Subunit Vacc-Novavax 5 MCG/0.5ML SUSY Inject 5 mcg into the muscle once as needed for up to 1 dose.   [DISCONTINUED] pantoprazole  (PROTONIX ) 40 MG tablet Take 1 tablet by mouth twice daily   No facility-administered encounter medications on file as of 03/22/2024.    Surgical History: Past Surgical History:  Procedure Laterality Date   COLONOSCOPY     COLONOSCOPY WITH PROPOFOL  N/A 02/15/2020   Procedure: COLONOSCOPY WITH PROPOFOL ;  Surgeon: Jinny Carmine, MD;  Location: [facility];  Service: Endoscopy;  Laterality: N/A;  priority 4   ESOPHAGOGASTRODUODENOSCOPY (EGD) WITH PROPOFOL  N/A 09/18/2015   Procedure: ESOPHAGOGASTRODUODENOSCOPY (EGD) WITH PROPOFOL  with ballon dilatation;  Surgeon: Carmine Jinny, MD;  Location: [facility];  Service: Endoscopy;  Laterality: N/A;   LAPAROSCOPIC OVARIAN      Medical History: Past Medical History:  Diagnosis Date   GERD (gastroesophageal reflux disease)    Hyperlipidemia  Hypertension    Knee pain    right knee, bone on bone   Motion sickness    [place]   Osteoarthritis of both knees    Shingles 12/14/2014   TMJ (dislocation of temporomandibular joint)     Family History: Family History  Problem Relation Age of Onset   Dementia Mother    Aortic aneurysm Mother    COPD Father    Breast cancer Sister 16   Diabetes Sister    COPD Brother    Cancer Brother     Stroke Sister       Review of Systems  Constitutional:  Negative for chills, fatigue and unexpected weight change.  HENT:  Negative for congestion, rhinorrhea, sneezing and sore throat.   Eyes:  Negative for redness.  Respiratory:  Negative for cough, chest tightness and shortness of breath.   Cardiovascular:  Negative for chest pain and palpitations.  Gastrointestinal:  Negative for abdominal pain, constipation, diarrhea, nausea and vomiting.  Genitourinary:  Negative for dysuria and frequency.  Musculoskeletal:  Positive for arthralgias. Negative for back pain, joint swelling and neck pain.  Skin:  Negative for rash.  Neurological: Negative.  Negative for tremors and numbness.  Hematological:  Negative for adenopathy. Does not bruise/bleed easily.  Psychiatric/Behavioral:  Positive for sleep disturbance. Negative for behavioral problems (Depression) and suicidal ideas. The patient is not nervous/anxious.      Vital Signs: BP 112/70   Pulse 63   Temp 98.3 F (36.8 C)   Resp 16   Ht 5' 3 (1.6 m)   Wt 244 lb 4.8 oz (110.8 kg)   SpO2 97%   BMI 43.28 kg/m    Physical Exam Vitals and nursing note reviewed.  Constitutional:      General: She is not in acute distress.    Appearance: She is well-developed. She is not diaphoretic.  HENT:     Head: Normocephalic and atraumatic.     Mouth/Throat:     Pharynx: No oropharyngeal exudate.  Eyes:     Pupils: Pupils are equal, round, and reactive to light.  Neck:     Thyroid : No thyromegaly.     Vascular: No JVD.     Trachea: No tracheal deviation.  Cardiovascular:     Rate and Rhythm: Normal rate and regular rhythm.     Heart sounds: Normal heart sounds. No murmur heard.    No friction rub. No gallop.  Pulmonary:     Effort: Pulmonary effort is normal. No respiratory distress.     Breath sounds: No wheezing or rales.  Chest:     Chest wall: No tenderness.  Abdominal:     General: Bowel sounds are normal.      Palpations: Abdomen is soft.     Tenderness: There is no abdominal tenderness.  Musculoskeletal:        General: Normal range of motion.     Cervical back: Normal range of motion and neck supple.  Lymphadenopathy:     Cervical: No cervical adenopathy.  Skin:    General: Skin is warm and dry.  Neurological:     Mental Status: She is alert and oriented to person, place, and time.     Cranial Nerves: No cranial nerve deficit.  Psychiatric:        Behavior: Behavior normal.        Thought Content: Thought content normal.        Judgment: Judgment normal.      LABS: Recent Results (  from the past 2160 hours)  CBC w/Diff/Platelet     Status: Abnormal   Collection Time: 02/08/24 11:10 AM  Result Value Ref Range   WBC 8.7 3.4 - 10.8 x10E3/uL   RBC 5.20 3.77 - 5.28 x10E6/uL   Hemoglobin 15.5 11.1 - 15.9 g/dL   Hematocrit 50.9 (H) 65.9 - 46.6 %   MCV 94 79 - 97 fL   MCH 29.8 26.6 - 33.0 pg   MCHC 31.6 31.5 - 35.7 g/dL   RDW 88.1 88.2 - 84.5 %   Platelets 239 150 - 450 x10E3/uL   Neutrophils 56 Not Estab. %   Lymphs 34 Not Estab. %   Monocytes 7 Not Estab. %   Eos 2 Not Estab. %   Basos 1 Not Estab. %   Neutrophils Absolute 4.9 1.4 - 7.0 x10E3/uL   Lymphocytes Absolute 2.9 0.7 - 3.1 x10E3/uL   Monocytes Absolute 0.6 0.1 - 0.9 x10E3/uL   EOS (ABSOLUTE) 0.1 0.0 - 0.4 x10E3/uL   Basophils Absolute 0.1 0.0 - 0.2 x10E3/uL   Immature Granulocytes 0 Not Estab. %   Immature Grans (Abs) 0.0 0.0 - 0.1 x10E3/uL  Comprehensive metabolic panel with GFR     Status: Abnormal   Collection Time: 02/08/24 11:10 AM  Result Value Ref Range   Glucose 94 70 - 99 mg/dL   BUN 17 8 - 27 mg/dL   Creatinine, Ser 9.42 0.57 - 1.00 mg/dL   eGFR 896 >40 fO/fpw/8.26   BUN/Creatinine Ratio 30 (H) 12 - 28   Sodium 140 134 - 144 mmol/L   Potassium 4.2 3.5 - 5.2 mmol/L   Chloride 99 96 - 106 mmol/L   CO2 23 20 - 29 mmol/L   Calcium  9.7 8.7 - 10.3 mg/dL   Total Protein 6.9 6.0 - 8.5 g/dL   Albumin 4.3 3.9  - 4.9 g/dL   Globulin, Total 2.6 1.5 - 4.5 g/dL   Bilirubin Total 0.7 0.0 - 1.2 mg/dL   Alkaline Phosphatase 96 44 - 121 IU/L    Comment: **Effective February 20, 2024 Alkaline Phosphatase**   reference interval will be changing to:              Age                Female          Female           0 -  5 days         47 - 127       47 - 127           6 - 10 days         29 - 242       29 - 242          11 - 20 days        109 - 357      109 - 357          21 - 30 days         94 - 494       94 - 494           1 -  2 months      149 - 539      149 - 539           3 -  6 months      131 - 452      131 - 452  7 - 11 months      117 - 401      117 - 401   12 months -  6 years       158 - 369      158 - 369           7 - 12 years       150 - 409      150 - 409               13 years       156 - 435       78 - 227               14 years       114 - 375       64 - 161               15 years        88 - 279       56 - 134               16 years        74 - 207       51 - 121               17 years        63 - 161       47 - 113          18 - 20 years        51 - 125       42 - 106          21 - 50 years         47 - 123       41 - 116          51 - 80 years        49 - 135       51 - 125              >80 years        48 - 129       48 - 129    AST 24 0 - 40 IU/L   ALT 22 0 - 32 IU/L  TSH + free T4     Status: None   Collection Time: 02/08/24 11:10 AM  Result Value Ref Range   TSH 0.616 0.450 - 4.500 uIU/mL   Free T4 1.30 0.82 - 1.77 ng/dL  Lipid Panel With LDL/HDL Ratio     Status: None   Collection Time: 02/08/24 11:10 AM  Result Value Ref Range   Cholesterol, Total 158 100 - 199 mg/dL   Triglycerides 870 0 - 149 mg/dL   HDL 54 >60 mg/dL   VLDL Cholesterol Cal 23 5 - 40 mg/dL   LDL Chol Calc (NIH) 81 0 - 99 mg/dL   LDL/HDL Ratio 1.5 0.0 - 3.2 ratio    Comment:                                     LDL/HDL Ratio  Men  Women                                1/2 Avg.Risk  1.0    1.5                                   Avg.Risk  3.6    3.2                                2X Avg.Risk  6.2    5.0                                3X Avg.Risk  8.0    6.1   B12 and Folate Panel     Status: None   Collection Time: 02/08/24 11:10 AM  Result Value Ref Range   Vitamin B-12 613 232 - 1,245 pg/mL   Folate >20.0 >3.0 ng/mL    Comment: A serum folate concentration of less than 3.1 ng/mL is considered to represent clinical deficiency.   VITAMIN D  25 Hydroxy (Vit-D Deficiency, Fractures)     Status: None   Collection Time: 02/08/24 11:10 AM  Result Value Ref Range   Vit D, 25-Hydroxy 79.8 30.0 - 100.0 ng/mL    Comment: Vitamin D  deficiency has been defined by the Institute of Medicine and an Endocrine Society practice guideline as a level of serum 25-OH vitamin D  less than 20 ng/mL (1,2). The Endocrine Society went on to further define vitamin D  insufficiency as a level between 21 and 29 ng/mL (2). 1. IOM (Institute of Medicine). 2010. Dietary reference    intakes for calcium  and D. [place]: The    [employer]. 2. Holick MF, Binkley [place], Bischoff-Ferrari HA, et al.    Evaluation, treatment, and prevention of vitamin D     deficiency: an Endocrine Society clinical practice    guideline. JCEM. 2011 Jul; 96(7):1911-30.   Fe+TIBC+Fer     Status: Abnormal   Collection Time: 02/08/24 11:10 AM  Result Value Ref Range   Total Iron Binding Capacity 317 250 - 450 ug/dL   UIBC 833 881 - 630 ug/dL   Iron 848 (H) 27 - 860 ug/dL   Iron Saturation 48 15 - 55 %   Ferritin 105 15 - 150 ng/mL        Assessment/Plan: 1. Encounter for general adult medical examination with abnormal findings (Primary) CPE performed, labs reviewed, UTD on PHM  2. Elevated hematocrit Recommended ONO vs sleep study--pt declines for now but will think about it  3. Morbid obesity with BMI of 40.0-44.9, adult (HCC) Down 17lbs since last visit and  applauded for this success. Continue to work on this  4. Dysuria - UA/M w/rflx Culture, Routine   General Counseling: Terah verbalizes understanding of the findings of todays visit and agrees with plan of treatment. I have discussed any further diagnostic evaluation that may be needed or ordered today. We also reviewed her medications today. she has been encouraged to call the office with any questions or concerns that should arise related to todays visit.    Counseling:    Orders Placed This Encounter  Procedures   UA/M w/rflx Culture, Routine    No orders of the defined  types were placed in this encounter.   This patient was seen by Tinnie Pro, PA-C in collaboration with Dr. Sigrid Bathe as a part of collaborative care agreement.  Total time spent:35 Minutes  Time spent includes review of chart, medications, test results, and follow up plan with the patient.     Sigrid CHRISTELLA Bathe, MD  Internal Medicine

## 2024-03-23 LAB — UA/M W/RFLX CULTURE, ROUTINE
Bilirubin, UA: NEGATIVE
Glucose, UA: NEGATIVE
Ketones, UA: NEGATIVE
Leukocytes,UA: NEGATIVE
Nitrite, UA: NEGATIVE
Protein,UA: NEGATIVE
RBC, UA: NEGATIVE
Specific Gravity, UA: 1.021 (ref 1.005–1.030)
Urobilinogen, Ur: 0.2 mg/dL (ref 0.2–1.0)
pH, UA: 5 (ref 5.0–7.5)

## 2024-03-23 LAB — MICROSCOPIC EXAMINATION
Bacteria, UA: NONE SEEN
Casts: NONE SEEN /LPF
Epithelial Cells (non renal): NONE SEEN /HPF (ref 0–10)
RBC, Urine: NONE SEEN /HPF (ref 0–2)

## 2024-03-26 ENCOUNTER — Other Ambulatory Visit: Payer: Self-pay | Admitting: Physician Assistant

## 2024-03-26 DIAGNOSIS — I10 Essential (primary) hypertension: Secondary | ICD-10-CM

## 2024-04-09 ENCOUNTER — Encounter: Payer: Self-pay | Admitting: Gastroenterology

## 2024-04-13 ENCOUNTER — Ambulatory Visit
Admission: RE | Admit: 2024-04-13 | Discharge: 2024-04-13 | Disposition: A | Discharge status: Home / Self Care | Attending: Gastroenterology | Admitting: Gastroenterology

## 2024-04-13 ENCOUNTER — Encounter: Admitting: Anesthesiology

## 2024-04-13 ENCOUNTER — Ambulatory Visit: Admitting: Anesthesiology

## 2024-04-13 ENCOUNTER — Encounter: Payer: Self-pay | Admitting: Gastroenterology

## 2024-04-13 ENCOUNTER — Other Ambulatory Visit: Payer: Self-pay

## 2024-04-13 ENCOUNTER — Encounter
Admission: RE | Disposition: A | Discharge status: Home / Self Care | Payer: Self-pay | Source: Home / Self Care | Attending: Gastroenterology

## 2024-04-13 DIAGNOSIS — Z6841 Body Mass Index (BMI) 40.0 and over, adult: Secondary | ICD-10-CM | POA: Diagnosis not present

## 2024-04-13 DIAGNOSIS — M1990 Unspecified osteoarthritis, unspecified site: Secondary | ICD-10-CM | POA: Diagnosis not present

## 2024-04-13 DIAGNOSIS — K2100 Gastro-esophageal reflux disease with esophagitis, without bleeding: Secondary | ICD-10-CM | POA: Diagnosis not present

## 2024-04-13 DIAGNOSIS — K219 Gastro-esophageal reflux disease without esophagitis: Secondary | ICD-10-CM | POA: Diagnosis present

## 2024-04-13 DIAGNOSIS — Z7982 Long term (current) use of aspirin: Secondary | ICD-10-CM | POA: Diagnosis not present

## 2024-04-13 DIAGNOSIS — I10 Essential (primary) hypertension: Secondary | ICD-10-CM | POA: Diagnosis not present

## 2024-04-13 DIAGNOSIS — E785 Hyperlipidemia, unspecified: Secondary | ICD-10-CM | POA: Insufficient documentation

## 2024-04-13 DIAGNOSIS — R519 Headache, unspecified: Secondary | ICD-10-CM | POA: Insufficient documentation

## 2024-04-13 DIAGNOSIS — E66813 Obesity, class 3: Secondary | ICD-10-CM | POA: Diagnosis not present

## 2024-04-13 DIAGNOSIS — Z79899 Other long term (current) drug therapy: Secondary | ICD-10-CM | POA: Diagnosis not present

## 2024-04-13 HISTORY — PX: ESOPHAGOGASTRODUODENOSCOPY: SHX5428

## 2024-04-13 SURGERY — EGD (ESOPHAGOGASTRODUODENOSCOPY)
Anesthesia: General | Site: Mouth

## 2024-04-13 MED ORDER — STERILE WATER FOR IRRIGATION IR SOLN
Status: DC | PRN
  Administered 2024-04-13: 1

## 2024-04-13 MED ORDER — GLYCOPYRROLATE 0.2 MG/ML IJ SOLN
INTRAMUSCULAR | Status: AC
  Filled 2024-04-13: qty 1

## 2024-04-13 MED ORDER — LIDOCAINE HCL (CARDIAC) PF 100 MG/5ML IV SOSY
PREFILLED_SYRINGE | INTRAVENOUS | Status: DC | PRN
  Administered 2024-04-13: 100 mg via INTRAVENOUS

## 2024-04-13 MED ORDER — GLYCOPYRROLATE 0.2 MG/ML IJ SOLN
INTRAMUSCULAR | Status: DC | PRN
  Administered 2024-04-13: .2 mg via INTRAVENOUS

## 2024-04-13 MED ORDER — PROPOFOL 10 MG/ML IV BOLUS
INTRAVENOUS | Status: DC | PRN
  Administered 2024-04-13: 30 mg via INTRAVENOUS
  Administered 2024-04-13: 80 mg via INTRAVENOUS
  Administered 2024-04-13: 40 mg via INTRAVENOUS

## 2024-04-13 SURGICAL SUPPLY — 6 items
BLOCK BITE 60FR ADLT L/F GRN (MISCELLANEOUS) ×1 IMPLANT
FORCEPS BIOP RAD 4 LRG CAP 4 (CUTTING FORCEPS) IMPLANT
GOWN CVR UNV OPN BCK APRN NK (MISCELLANEOUS) ×2 IMPLANT
KIT PRC NS LF DISP ENDO (KITS) ×1 IMPLANT
MANIFOLD NEPTUNE II (INSTRUMENTS) ×1 IMPLANT
WATER STERILE IRR 250ML POUR (IV SOLUTION) ×1 IMPLANT

## 2024-04-13 NOTE — Op Note (Signed)
 [facility] Patient Name: Mandy Baldwin Procedure Date: 04/13/2024 8:07 AM MRN: [id number] Account #: [id number] Date of Birth: [birth year] Admit Type: Outpatient Age: 63 Room: [facility] OR ROOM 01 Gender: Female Note Status: Finalized Instrument Name: Endoscope [id number] Procedure:             Upper GI endoscopy Indications:           Esophageal reflux symptoms that recur despite                         appropriate therapy Providers:             Corinn Jess Brooklyn MD, MD Referring MD:          Tinnie LOIS Pro (Referring MD) Medicines:             General Anesthesia Complications:         No immediate complications. Estimated blood loss: None. Procedure:             Pre-Anesthesia Assessment:                        - Prior to the procedure, a History and Physical was                         performed, and patient medications and allergies were                         reviewed. The patient is competent. The risks and                         benefits of the procedure and the sedation options and                         risks were discussed with the patient. All questions                         were answered and informed consent was obtained.                         Patient identification and proposed procedure were                         verified by the physician, the nurse, the                         anesthesiologist, the anesthetist and the technician                         in the pre-procedure area in the procedure room in the                         endoscopy suite. Mental Status Examination: alert and                         oriented. Airway Examination: normal oropharyngeal                         airway and neck mobility. Respiratory Examination:  clear to auscultation. CV Examination: normal.                         Prophylactic Antibiotics: The patient does not require                         prophylactic  antibiotics. Prior Anticoagulants: The                         patient has taken no anticoagulant or antiplatelet                         agents. ASA Grade Assessment: III - A patient with                         severe systemic disease. After reviewing the risks and                         benefits, the patient was deemed in satisfactory                         condition to undergo the procedure. The anesthesia                         plan was to use general anesthesia. Immediately prior                         to administration of medications, the patient was                         re-assessed for adequacy to receive sedatives. The                         heart rate, respiratory rate, oxygen saturations,                         blood pressure, adequacy of pulmonary ventilation, and                         response to care were monitored throughout the                         procedure. The physical status of the patient was                         re-assessed after the procedure.                        After obtaining informed consent, the endoscope was                         passed under direct vision. Throughout the procedure,                         the patient's blood pressure, pulse, and oxygen                         saturations were monitored continuously. The Endoscope  was introduced through the mouth, and advanced to the                         second part of duodenum. The upper GI endoscopy was                         accomplished without difficulty. The patient tolerated                         the procedure well. Findings:      The duodenal bulb and second portion of the duodenum were normal.      The entire examined stomach was normal. Biopsies were taken with a cold       forceps for histology.      The cardia and gastric fundus were normal on retroflexion.      The gastroesophageal junction and examined esophagus were normal.       Biopsies were  taken with a cold forceps for histology. Impression:            - Normal duodenal bulb and second portion of the                         duodenum.                        - Normal stomach. Biopsied.                        - Normal gastroesophageal junction and esophagus.                         Biopsied. Recommendation:        - Await pathology results.                        - Discharge patient to home (with escort).                        - Resume previous diet today.                        - Continue present medications. Procedure Code(s):     --- Professional ---                        [phone], Esophagogastroduodenoscopy, flexible,                         transoral; with biopsy, single or multiple Diagnosis Code(s):     --- Professional ---                        K21.9, Gastro-esophageal reflux disease without                         esophagitis CPT copyright 2022 American Medical Association. All rights reserved. The codes documented in this report are preliminary and upon coder review may  be revised to meet current compliance requirements. Dr. Corinn Brooklyn Corinn Jess Brooklyn MD, MD 04/13/2024 8:27:06 AM This report has been signed electronically. Number of Addenda: 0 Note Initiated On: 04/13/2024 8:07 AM Total Procedure Duration: 0 hours 5 minutes  19 seconds  Estimated Blood Loss:  Estimated blood loss: none.      [facility]

## 2024-04-13 NOTE — Transfer of Care (Signed)
 Immediate Anesthesia Transfer of Care Note  Patient: Mandy Baldwin  Procedure(s) Performed: EGD (ESOPHAGOGASTRODUODENOSCOPY) WITH BIOPSY (Mouth)  Patient Location: PACU  Anesthesia Type: General  Level of Consciousness: awake, alert  and patient cooperative  Airway and Oxygen Therapy: Patient Spontanous Breathing and Patient connected to supplemental oxygen  Post-op Assessment: Post-op Vital signs reviewed, Patient's Cardiovascular Status Stable, Respiratory Function Stable, Patent Airway and No signs of Nausea or vomiting  Post-op Vital Signs: Reviewed and stable  Complications: No notable events documented.

## 2024-04-13 NOTE — H&P (Signed)
 Mandy JONELLE Brooklyn, MD [facility], [facility] [address]  [place], [place] [address]  Main: [phone] Fax:  [phone] Pager: [phone]   Primary Care Physician:  Kristina Tinnie POUR, PA-C Primary Gastroenterologist:  Dr. Corinn JONELLE Baldwin  Pre-Procedure History & Physical: HPI:  Mandy Baldwin is a 63 y.o. female is here for an endoscopy.   Past Medical History:  Diagnosis Date   GERD (gastroesophageal reflux disease)    Hyperlipidemia    Hypertension    Knee pain    right knee, bone on bone   Motion sickness    [place]   Osteoarthritis of both knees    Shingles 12/14/2014   TMJ (dislocation of temporomandibular joint)     Past Surgical History:  Procedure Laterality Date   COLONOSCOPY     COLONOSCOPY WITH PROPOFOL  N/A 02/15/2020   Procedure: COLONOSCOPY WITH PROPOFOL ;  Surgeon: Jinny Carmine, MD;  Location: [facility];  Service: Endoscopy;  Laterality: N/A;  priority 4   ESOPHAGOGASTRODUODENOSCOPY (EGD) WITH PROPOFOL  N/A 09/18/2015   Procedure: ESOPHAGOGASTRODUODENOSCOPY (EGD) WITH PROPOFOL  with ballon dilatation;  Surgeon: Carmine Jinny, MD;  Location: [facility];  Service: Endoscopy;  Laterality: N/A;   LAPAROSCOPIC OVARIAN      Prior to Admission medications   Medication Sig Start Date End Date Taking? Authorizing Provider  aspirin  81 MG tablet Take 81 mg by mouth daily.    [provider]  atorvastatin  (LIPITOR) 20 MG tablet Take 1 tablet (20 mg total) by mouth daily. pm Patient taking differently: Take 40 mg by mouth daily. pm 10/28/22 03/22/24  McDonough, Tinnie POUR, PA-C  Biotin 2500 MCG CHEW Chew 2,500 mcg by mouth daily.    [provider]  cholecalciferol (VITAMIN D ) 1000 UNITS tablet Take 2,000 Units by mouth daily.    [provider]  cyclobenzaprine  (FLEXERIL ) 5 MG tablet Take 1 tablet (5 mg total) by mouth 3 (three) times daily as needed for muscle spasms. 10/12/19   Vivienne Delon HERO, PA-C  esomeprazole (NEXIUM) 40 MG capsule Take 40 mg by mouth 2 (two) times daily before a meal.    [provider]  fluticasone  (FLONASE ) 50 MCG/ACT nasal spray Use 2 spray(s) in each nostril once daily 09/08/20   Burnette, Jennifer M, PA-C  magnesium oxide (MAG-OX) 400 MG tablet Take 400 mg by mouth daily.    [provider]  meloxicam  (MOBIC ) 7.5 MG tablet Take 1 tablet by mouth once daily 11/29/23   McDonough, Lauren K, PA-C  metoprolol  succinate (TOPROL -XL) 25 MG 24 hr tablet Take 2 tablets by mouth once daily 03/26/24   McDonough, Lauren K, PA-C  Multiple Vitamins-Minerals (MULTIVITAL PO) Take 1 tablet by mouth daily.    [provider]  promethazine  (PHENERGAN ) 12.5 MG tablet TAKE 1 TABLET BY MOUTH EVERY 8 HOURS AS NEEDED FOR NAUSEA AND VOMITING 12/20/23   McDonough, Lauren K, PA-C  triamcinolone  cream (KENALOG ) 0.1 % Apply 1 Application topically 2 (two) times daily as needed (itching and rash). 11/14/23   McDonough, Lauren K, PA-C  triamterene -hydrochlorothiazide  (MAXZIDE-25) 37.5-25 MG tablet TAKE 1 TABLET BY MOUTH ONCE DAILY (APPOINMENT  REQUIRED  FOR  FUTURE  REFILLS) 02/27/24   McDonough, Tinnie POUR, PA-C  vitamin k 100 MCG tablet Take 100 mcg by mouth daily.    [provider]    Allergies as of 03/27/2024 - Review Complete 03/22/2024  Allergen Reaction Noted   Latex Hives and Rash 02/07/2020   Tape Rash  09/12/2015    Family History  Problem Relation Age of Onset   Dementia Mother    Aortic aneurysm Mother    COPD Father    Breast cancer Sister 58   Diabetes Sister    COPD Brother    Cancer Brother    Stroke Sister     Social History   Socioeconomic History   Marital status: Married    Spouse name: Angelyna Henderson   Number of children: 0   Years of education: 13   Highest education level: Not on file  Occupational History   Occupation: Clinical Biochemist    Comment: Production Manager: [employer]  Tobacco Use    Smoking status: Never   Smokeless tobacco: Never  Vaping Use   Vaping status: Never Used  Substance and Sexual Activity   Alcohol use: No   Drug use: Never   Sexual activity: Not Currently  Other Topics Concern   Not on file  Social History Narrative   Not on file   Social Drivers of Health   Financial Resource Strain: Low Risk  (03/20/2024)   Received from [facility]   Overall Financial Resource Strain (CARDIA)    Difficulty of Paying Living Expenses: Not hard at all  Food Insecurity: No Food Insecurity (03/20/2024)   Received from [facility]   Hunger Vital Sign    Within the past 12 months, you worried that your food would run out before you got the money to buy more.: Never true    Within the past 12 months, the food you bought just didn't last and you didn't have money to get more.: Never true  Transportation Needs: No Transportation Needs (03/20/2024)   Received from [facility] - Transportation    In the past 12 months, has lack of transportation kept you from medical appointments or from getting medications?: No    Lack of Transportation (Non-Medical): No  Physical Activity: Unknown (04/14/2017)   Exercise Vital Sign    Days of Exercise per Week: 0 days    Minutes of Exercise per Session: Not on file  Stress: Not on file  Social Connections: Not on file  Intimate Partner Violence: Not At Risk (02/17/2023)   Humiliation, Afraid, Rape, and Kick questionnaire    Fear of Current or Ex-Partner: No    Emotionally Abused: No    Physically Abused: No    Sexually Abused: No    Review of Systems: See HPI, otherwise negative ROS  Physical Exam: BP (!) 147/83   Pulse 65   Temp 98 F (36.7 C) (Temporal)   Resp 18   Ht 5' 3 (1.6 m)   Wt 119.7 kg   SpO2 96%   BMI 46.77 kg/m  General:   Alert,  pleasant and cooperative in NAD Head:  Normocephalic and atraumatic. Neck:  Supple; no masses or  thyromegaly. Lungs:  Clear throughout to auscultation.    Heart:  Regular rate and rhythm. Abdomen:  Soft, nontender and nondistended. Normal bowel sounds, without guarding, and without rebound.   Neurologic:  Alert and  oriented x4;  grossly normal neurologically.  Impression/Plan: Mandy Baldwin is here for an endoscopy to be performed for chronic GERD  Risks, benefits, limitations, and alternatives regarding  endoscopy have been reviewed with the patient.  Questions have been answered.  All parties agreeable.   Mandy Brooklyn, MD  04/13/2024, 8:06 AM

## 2024-04-13 NOTE — Anesthesia Preprocedure Evaluation (Addendum)
 Anesthesia Evaluation  Patient identified by MRN, date of birth, ID band Patient awake    Reviewed: Allergy & Precautions, NPO status , Patient's Chart, lab work & pertinent test results  History of Anesthesia Complications Negative for: history of anesthetic complications  Airway Mallampati: III  TM Distance: >3 FB Neck ROM: full    Dental  (+) Chipped,    Pulmonary neg pulmonary ROS   Pulmonary exam normal        Cardiovascular hypertension, On Medications Normal cardiovascular exam     Neuro/Psych  Headaches  negative psych ROS   GI/Hepatic Neg liver ROS,GERD  Medicated,,  Endo/Other    Class 3 obesity  Renal/GU negative Renal ROS  negative genitourinary   Musculoskeletal  (+) Arthritis , Osteoarthritis,    Abdominal   Peds  Hematology negative hematology ROS (+)   Anesthesia Other Findings Past Medical History: No date: GERD (gastroesophageal reflux disease) No date: Hyperlipidemia No date: Hypertension No date: Knee pain     Comment:  right knee, bone on bone No date: Motion sickness     Comment[place] No date: Osteoarthritis of both knees 12/14/2014: Shingles No date: TMJ (dislocation of temporomandibular joint)  Past Surgical History: No date: COLONOSCOPY 02/15/2020: COLONOSCOPY WITH PROPOFOL ; N/A     Comment:  Procedure: COLONOSCOPY WITH PROPOFOL ;  Surgeon: Jinny Carmine, MD;  Location: [facility];  Service:               Endoscopy;  Laterality: N/A;  priority 4 09/18/2015: ESOPHAGOGASTRODUODENOSCOPY (EGD) WITH PROPOFOL ; N/A     Comment:  Procedure: ESOPHAGOGASTRODUODENOSCOPY (EGD) WITH               PROPOFOL  with ballon dilatation;  Surgeon: Carmine Jinny,               MD;  Location: [facility];  Service: Endoscopy;               Laterality: N/A; No date: LAPAROSCOPIC OVARIAN  BMI    Body Mass Index: 46.59 kg/m      Reproductive/Obstetrics negative  OB ROS                              Anesthesia Physical Anesthesia Plan  ASA: 3  Anesthesia Plan: General   Post-op Pain Management: Minimal or no pain anticipated   Induction: Intravenous  PONV Risk Score and Plan: 2 and Propofol  infusion and TIVA  Airway Management Planned: Natural Airway and Nasal Cannula  Additional Equipment:   Intra-op Plan:   Post-operative Plan:   Informed Consent: I have reviewed the patients History and Physical, chart, labs and discussed the procedure including the risks, benefits and alternatives for the proposed anesthesia with the patient or authorized representative who has indicated his/her understanding and acceptance.     Dental Advisory Given  Plan Discussed with: Anesthesiologist, CRNA and Surgeon  Anesthesia Plan Comments: (Patient consented for risks of anesthesia including but not limited to:  - adverse reactions to medications - risk of airway placement if required - damage to eyes, teeth, lips or other oral mucosa - nerve damage due to positioning  - sore throat or hoarseness - Damage to heart, brain, nerves, lungs, other parts of body or loss of life  Patient voiced understanding and assent.)         Anesthesia Quick  Evaluation

## 2024-04-13 NOTE — Anesthesia Postprocedure Evaluation (Signed)
 Anesthesia Post Note  Patient: Mandy Baldwin  Procedure(s) Performed: EGD (ESOPHAGOGASTRODUODENOSCOPY) WITH BIOPSY (Mouth)  Patient location during evaluation: PACU Anesthesia Type: General Level of consciousness: awake and alert Pain management: pain level controlled Vital Signs Assessment: post-procedure vital signs reviewed and stable Respiratory status: spontaneous breathing, nonlabored ventilation, respiratory function stable and patient connected to nasal cannula oxygen Cardiovascular status: blood pressure returned to baseline and stable Postop Assessment: no apparent nausea or vomiting Anesthetic complications: no   No notable events documented.   Last Vitals:  Vitals:   04/13/24 0833 04/13/24 0844  BP: (!) 139/106 139/78  Pulse:  69  Resp: 20 17  Temp: 36.7 C 36.7 C  SpO2:  99%    Last Pain:  Vitals:   04/13/24 0844  TempSrc:   PainSc: 0-No pain                 Lendia LITTIE Mae

## 2024-04-17 LAB — SURGICAL PATHOLOGY

## 2024-04-18 ENCOUNTER — Ambulatory Visit: Payer: Self-pay | Admitting: Gastroenterology

## 2024-04-18 NOTE — Progress Notes (Signed)
 Pathology results from upper endoscopy came back unremarkable  Thanks RV

## 2024-06-20 ENCOUNTER — Other Ambulatory Visit: Payer: Self-pay | Admitting: Physician Assistant

## 2024-06-20 DIAGNOSIS — R110 Nausea: Secondary | ICD-10-CM

## 2024-06-25 ENCOUNTER — Encounter: Payer: Self-pay | Admitting: Physician Assistant

## 2024-06-25 ENCOUNTER — Other Ambulatory Visit: Payer: Self-pay

## 2024-06-25 DIAGNOSIS — I10 Essential (primary) hypertension: Secondary | ICD-10-CM

## 2024-06-25 MED ORDER — TRIAMTERENE-HCTZ 37.5-25 MG PO TABS: ORAL_TABLET | ORAL | 0 refills | Status: AC

## 2024-09-21 ENCOUNTER — Ambulatory Visit: Admitting: Physician Assistant

## 2025-03-25 ENCOUNTER — Encounter: Admitting: Physician Assistant
